# Patient Record
Sex: Female | Born: 1970 | Race: White | Hispanic: No | Smoking: Former smoker
Health system: Southern US, Community
[De-identification: ages and names within clinical notes are randomized; demographics above are authoritative.]

## PROBLEM LIST (undated history)

## (undated) DIAGNOSIS — E119 Type 2 diabetes mellitus without complications: Secondary | ICD-10-CM

## (undated) DIAGNOSIS — T4145XA Adverse effect of unspecified anesthetic, initial encounter: Secondary | ICD-10-CM

## (undated) DIAGNOSIS — Z8679 Personal history of other diseases of the circulatory system: Secondary | ICD-10-CM

## (undated) DIAGNOSIS — U071 COVID-19: Secondary | ICD-10-CM

## (undated) DIAGNOSIS — B009 Herpesviral infection, unspecified: Secondary | ICD-10-CM

## (undated) DIAGNOSIS — Z87442 Personal history of urinary calculi: Secondary | ICD-10-CM

## (undated) DIAGNOSIS — A630 Anogenital (venereal) warts: Secondary | ICD-10-CM

## (undated) DIAGNOSIS — E785 Hyperlipidemia, unspecified: Secondary | ICD-10-CM

## (undated) DIAGNOSIS — I10 Essential (primary) hypertension: Secondary | ICD-10-CM

## (undated) DIAGNOSIS — T8859XA Other complications of anesthesia, initial encounter: Secondary | ICD-10-CM

## (undated) DIAGNOSIS — K219 Gastro-esophageal reflux disease without esophagitis: Secondary | ICD-10-CM

## (undated) DIAGNOSIS — E079 Disorder of thyroid, unspecified: Secondary | ICD-10-CM

## (undated) HISTORY — DX: Hyperlipidemia, unspecified: E78.5

## (undated) HISTORY — DX: Essential (primary) hypertension: I10

---

## 1998-07-12 ENCOUNTER — Ambulatory Visit (HOSPITAL_BASED_OUTPATIENT_CLINIC_OR_DEPARTMENT_OTHER): Admission: RE | Admit: 1998-07-12 | Discharge: 1998-07-12 | Payer: Self-pay | Admitting: Surgery

## 1999-09-28 ENCOUNTER — Encounter (INDEPENDENT_AMBULATORY_CARE_PROVIDER_SITE_OTHER): Payer: Self-pay | Admitting: Specialist

## 1999-09-28 ENCOUNTER — Other Ambulatory Visit: Admission: RE | Admit: 1999-09-28 | Discharge: 1999-09-28 | Payer: Self-pay | Admitting: Obstetrics and Gynecology

## 1999-10-13 ENCOUNTER — Encounter (INDEPENDENT_AMBULATORY_CARE_PROVIDER_SITE_OTHER): Payer: Self-pay

## 1999-10-13 ENCOUNTER — Other Ambulatory Visit: Admission: RE | Admit: 1999-10-13 | Discharge: 1999-10-13 | Payer: Self-pay | Admitting: *Deleted

## 1999-12-13 ENCOUNTER — Encounter: Payer: Self-pay | Admitting: Family Medicine

## 1999-12-13 ENCOUNTER — Encounter: Admission: RE | Admit: 1999-12-13 | Discharge: 1999-12-13 | Payer: Self-pay | Admitting: Family Medicine

## 2000-02-13 ENCOUNTER — Other Ambulatory Visit: Admission: RE | Admit: 2000-02-13 | Discharge: 2000-02-13 | Payer: Self-pay | Admitting: *Deleted

## 2000-06-12 ENCOUNTER — Other Ambulatory Visit: Admission: RE | Admit: 2000-06-12 | Discharge: 2000-06-12 | Payer: Self-pay | Admitting: *Deleted

## 2000-11-07 ENCOUNTER — Other Ambulatory Visit: Admission: RE | Admit: 2000-11-07 | Discharge: 2000-11-07 | Payer: Self-pay | Admitting: *Deleted

## 2001-03-11 ENCOUNTER — Other Ambulatory Visit: Admission: RE | Admit: 2001-03-11 | Discharge: 2001-03-11 | Payer: Self-pay | Admitting: Obstetrics and Gynecology

## 2001-07-12 ENCOUNTER — Other Ambulatory Visit: Admission: RE | Admit: 2001-07-12 | Discharge: 2001-07-12 | Payer: Self-pay | Admitting: Obstetrics and Gynecology

## 2002-01-23 ENCOUNTER — Other Ambulatory Visit: Admission: RE | Admit: 2002-01-23 | Discharge: 2002-01-23 | Payer: Self-pay | Admitting: Obstetrics and Gynecology

## 2002-08-27 ENCOUNTER — Other Ambulatory Visit: Admission: RE | Admit: 2002-08-27 | Discharge: 2002-08-27 | Payer: Self-pay | Admitting: Obstetrics and Gynecology

## 2002-12-08 ENCOUNTER — Other Ambulatory Visit: Admission: RE | Admit: 2002-12-08 | Discharge: 2002-12-08 | Payer: Self-pay | Admitting: Obstetrics and Gynecology

## 2003-06-15 ENCOUNTER — Other Ambulatory Visit: Admission: RE | Admit: 2003-06-15 | Discharge: 2003-06-15 | Payer: Self-pay | Admitting: Obstetrics and Gynecology

## 2004-01-13 ENCOUNTER — Other Ambulatory Visit: Admission: RE | Admit: 2004-01-13 | Discharge: 2004-01-13 | Payer: Self-pay | Admitting: Family Medicine

## 2004-01-14 ENCOUNTER — Encounter: Admission: RE | Admit: 2004-01-14 | Discharge: 2004-01-14 | Payer: Self-pay | Admitting: Obstetrics and Gynecology

## 2004-02-28 ENCOUNTER — Emergency Department (HOSPITAL_COMMUNITY): Admission: EM | Admit: 2004-02-28 | Discharge: 2004-02-28 | Payer: Self-pay | Admitting: Emergency Medicine

## 2004-09-23 ENCOUNTER — Other Ambulatory Visit: Admission: RE | Admit: 2004-09-23 | Discharge: 2004-09-23 | Payer: Self-pay | Admitting: Obstetrics and Gynecology

## 2004-10-31 ENCOUNTER — Encounter: Admission: RE | Admit: 2004-10-31 | Discharge: 2004-11-07 | Payer: Self-pay | Admitting: Family Medicine

## 2005-06-07 ENCOUNTER — Other Ambulatory Visit: Admission: RE | Admit: 2005-06-07 | Discharge: 2005-06-07 | Payer: Self-pay | Admitting: Obstetrics and Gynecology

## 2005-08-09 ENCOUNTER — Encounter: Admission: RE | Admit: 2005-08-09 | Discharge: 2005-08-09 | Payer: Self-pay | Admitting: Obstetrics and Gynecology

## 2005-08-15 ENCOUNTER — Inpatient Hospital Stay (HOSPITAL_COMMUNITY): Admission: AD | Admit: 2005-08-15 | Discharge: 2005-08-15 | Payer: Self-pay | Admitting: Obstetrics and Gynecology

## 2005-09-06 ENCOUNTER — Inpatient Hospital Stay (HOSPITAL_COMMUNITY): Admission: RE | Admit: 2005-09-06 | Discharge: 2005-09-09 | Payer: Self-pay | Admitting: Obstetrics and Gynecology

## 2005-09-10 ENCOUNTER — Encounter: Admission: RE | Admit: 2005-09-10 | Discharge: 2005-10-10 | Payer: Self-pay | Admitting: Obstetrics and Gynecology

## 2005-10-11 ENCOUNTER — Encounter: Admission: RE | Admit: 2005-10-11 | Discharge: 2005-11-09 | Payer: Self-pay | Admitting: Obstetrics and Gynecology

## 2005-10-23 ENCOUNTER — Other Ambulatory Visit: Admission: RE | Admit: 2005-10-23 | Discharge: 2005-10-23 | Payer: Self-pay | Admitting: Obstetrics and Gynecology

## 2007-06-04 HISTORY — PX: CARDIOVASCULAR STRESS TEST: SHX262

## 2009-01-06 HISTORY — PX: TRANSTHORACIC ECHOCARDIOGRAM: SHX275

## 2010-05-30 ENCOUNTER — Encounter: Admission: RE | Admit: 2010-05-30 | Discharge: 2010-05-30 | Payer: Self-pay | Admitting: Otolaryngology

## 2011-04-21 NOTE — H&P (Signed)
Julia [name], PEPLINSKI                   ACCOUNT NO.:  [id number]   MEDICAL RECORD NO.:  [id number]          PATIENT TYPE:  INP   LOCATION:  NA                            FACILITY:  [facility]   PHYSICIAN:  [name], M.D.DATE OF BIRTH:  04/30/1971   DATE OF ADMISSION:  09/06/2005  DATE OF DISCHARGE:                                HISTORY & PHYSICAL   CHIEF COMPLAINT:  1.  Mature L/S ratio at 36 weeks.  2.  Fetal macrosomia with history of diabetes.   HISTORY:  A 40 year old, G1, P0, EDD of September 29, 2005, EGA is 36-1/2  weeks.  This patient's pregnancy has been complicated by diabetes that has  required initially Glyburide and then insulin for regulation of her blood  sugars.  More recently she had some gestational hypertension.  An  amniocentesis was carried out earlier this week showing an mature L/S 3.2:1  positive PG.  Because of the EFW being 8 pounds 7 ounces with an unfavorable  cervix and a floating vertex, her preference is for primary cesarean  section.  This procedure including risks of bleeding, infection,  transfusion, adjacent organ injury, along with her expected recovery all  reviewed with her which she understands and accepts.   Additionally, she has had twice weekly nonstress test that have been  reactive.   PAST MEDICAL HISTORY:  Please see her [place] form for details.  She does  have a history of:  1.  Anxiety.  2.  Depression.  3.  Uses an inhaler p.r.n. for asthmatic bronchitis.  4.  Has had been on oral meds for diabetes in the past.   Her blood is O positive.  Rubella titer is immune.   PHYSICAL EXAMINATION:  VITAL SIGNS:  Temp 98.2, blood pressure 132/88.  HEENT:  Unremarkable.  NECK:  Supple without masses.  LUNGS:  Clear.  CARDIOVASCULAR:  Regular rate and rhythm without murmurs, rubs, gallops.  BREASTS:  Without masses.  ABDOMEN:  Fundal height is 41-cm.  Fetal heart rate 140s.  PELVIC:  Cervix long closed floating vertex.  EXTREMITIES:   Unremarkable.  NEUROLOGIC:  Unremarkable.   IMPRESSION:  1.  A 36+ week intrauterine pregnancy.  2.  Pregnancy complicated by gestational hypertension and insulin-requiring      diabetes.  3.  Mature L/S ratio.  4.  Fetal macrosomia with an unfavorable cervix.   PLAN:  Primary cesarean section.  Procedure and risks reviewed as above.      Richard M. [name], M.D.  Electronically Signed     RMH/MEDQ  D:  09/05/2005  T:  09/05/2005  Job:  [id number]

## 2011-04-21 NOTE — Discharge Summary (Signed)
Julia [name], Julia [name]                   ACCOUNT NO.:  [id number]   MEDICAL RECORD NO.:  [id number]          PATIENT TYPE:  INP   LOCATION:  9145                          FACILITY:  [facility]   PHYSICIAN:  Dineen Kid. Rana Snare, M.D.    DATE OF BIRTH:  [birth year]   DATE OF ADMISSION:  09/06/2005  DATE OF DISCHARGE:  09/09/2005                                 DISCHARGE SUMMARY   ADMITTING DIAGNOSES:  1.  Intrauterine pregnancy at 36-and-a-half weeks estimated gestational age.  2.  Gestational diabetes, insulin-dependent.  3.  Status post amniocentesis with mature L/S ratio.  4.  Fetal macrosomia.   DISCHARGE DIAGNOSES:  1.  Status post low transverse cesarean section.  2.  Viable female infant.   PROCEDURE:  Primary low transverse cesarean section.   REASON FOR ADMISSION:  Please see dictated H&P.   HOSPITAL COURSE:  The patient is a 40 year old primigravida that was  admitted to [facility] at 36-and-a-half weeks for a  scheduled cesarean delivery. The patient's pregnancy had been complicated by  diabetes which initially had been managed by glyburide; however, during the  pregnancy insulin was needed for regulation of her blood sugar.  Amniocentesis had been carried out early in the week prior to admission with  mature L/S ratio of 3.2:1 with positive PG. Because of the estimated fetal  weight being 8 pounds 7 ounces with unfavorable cervix with a floating  vertex, decision was made to proceed with a primary cesarean delivery. On  the morning of admission the patient had been taken to the operating room  where spinal anesthesia was administered without difficulty. A low  transverse incision was made with the delivery of a viable female infant  weighing 10 pounds 4 ounces, Apgars of 8 at one minute and 9 at five  minutes. Arterial cord pH was 7.23. The patient tolerated the procedure well  and was taken to the recovery room in stable condition. On postoperative day  #1 the patient was  tolerating a regular diet. She had good relief of pain  from oral medication. Vital signs were stable. Blood pressure 110/70. She  was afebrile. Capillary blood sugars revealed sugars ranging 153-174.  Hemoglobin was 11.0; platelet count of 243,000; wbc count of 9.7. Abdomen  was soft with good return of bowel function. Fundus was firm and nontender.  The patient was restarted on glyburide 10 mg twice a day. On postoperative  day #2 vital signs were stable. Capillary blood sugars were noted to be 118-  174. However, later in the morning, capillary blood sugar had dropped to 71.  Decision was made to discontinue the glyburide. On postoperative day #3 the  patient was doing well. She was without complaint. Vital signs were stable.  Capillary blood sugars were within normal limits off the glyburide. Incision  was clean, dry and intact. Staples were removed and the patient was later  discharged home.   CONDITION ON DISCHARGE:  Good.   DIET:  Regular as tolerated.   ACTIVITY:  No heavy lifting, no driving x2 weeks, no vaginal entry.  FOLLOW-UP:  The patient is to follow up in the office in 1-2 weeks for an  incision check. She is to call for temperature greater than 100 degrees,  persistent nausea and vomiting, heavy vaginal bleeding, and/or redness or  drainage from the incisional site.   DISCHARGE MEDICATIONS:  1.  Tylox #30 one p.o. q.4-6h. p.r.n.  2.  Motrin 600 mg q.6h.  3.  Albuterol inhaler as needed.  4.  Colace one p.o. daily p.r.n.      Julio Sicks, N.P.      Dineen Kid Rana Snare, M.D.  Electronically Signed    CC/MEDQ  D:  10/13/2005  T:  10/13/2005  Job:  [id number]

## 2011-04-21 NOTE — Op Note (Signed)
NAMEANVITA, HIRATA                   ACCOUNT NO.:  [id number]   MEDICAL RECORD NO.:  [id number]          PATIENT TYPE:  INP   LOCATION:  9199                          FACILITY:  [facility]   PHYSICIAN:  [name], M.D.DATE OF BIRTH:  1970-12-19   DATE OF PROCEDURE:  09/06/2005  DATE OF DISCHARGE:                                 OPERATIVE REPORT   PREOPERATIVE DIAGNOSES:  1.  A 36-1/2 week intrauterine pregnancy.  2.  Insulin-requiring diabetes.  3.  _Mature L/S ratio.  4.  Fetal macrosomia, unfavorable cervix.   POSTOPERATIVE DIAGNOSES:  1.  A 36-1/2 week intrauterine pregnancy.  2.  Insulin-requiring diabetes.  3.  Mature L/S ratio  4.  Fetal macrosomia, unfavorable cervix.   PROCEDURE:  Primary low transverse cesarean section.   SURGEON:  [name], M.D.   ANESTHESIA:  Spinal.   COMPLICATIONS:  None.   DRAINS:  Foley catheter.   ESTIMATED BLOOD LOSS:  800.   PROCEDURE AND FINDINGS:  The patient was taken to the operating room and  after an adequate level of spinal anesthetic was obtained, with the patient  in left tilt position, the abdomen was prepped and draped in usual manner  for sterile abdominal procedure.  Foley catheter positioned draining clear  urine transversely.  A Pfannenstiel incision was made two fingerbreadths  above the symphysis, carried down to the fascia, which was incised and  extended transversely. Rectus muscle divided in the midline. Peritoneum  entered superiorly without incident and extended in a vertical fashion. The  vesicouterine serosa was then incised and the bladder was bluntly sharply  dissected off the lower uterine segment. Bladder blade was positioned.  Transverse incision made in the low segment, extended with bandage scissors.  Clear fluid noted. The patient delivered of a healthy female, Apgars 8 and 9,  cord pH 7.23. The infant was suctioned, cord clamped, and passed to  pediatric team for further care. Placenta was  delivered manually intact.  Uterus exteriorized, cavity wiped clean with laparotomy pack. Closure  obtained with first layer of 0 chromic in locked fashion followed by an  imbricating layer of 0 chromic. This was hemostatic. Tubes and ovaries were  normal. Bladder flap area was intact and hemostatic. Prior to closure,  sponge, needle, and instrument counts were reported as correct x2.  Peritoneum closed with a 2-0 Vicryl suture. Rectus muscles reapproximated in  the midline  with interrupted 2-0 Vicryl sutures. Fascia closed from laterally to midline  on either side with 0 PDS suture. Subcutaneous fat was hemostatic.  Clips  and Steri-Strips used on the skin. She tolerated this well, went to recovery  room in good condition. She did receive Pitocin IV and Ancef 1 g IV after  cord was clamped.      Richard M. [name], M.D.  Electronically Signed     RMH/MEDQ  D:  09/06/2005  T:  09/06/2005  Job:  [id number]

## 2012-07-30 ENCOUNTER — Ambulatory Visit (INDEPENDENT_AMBULATORY_CARE_PROVIDER_SITE_OTHER): Payer: 59 | Admitting: Internal Medicine

## 2012-07-30 VITALS — BP 140/95 | HR 82 | Temp 98.8°F | Resp 18 | Ht 64.0 in | Wt 175.0 lb

## 2012-07-30 DIAGNOSIS — I10 Essential (primary) hypertension: Secondary | ICD-10-CM

## 2012-07-30 DIAGNOSIS — E119 Type 2 diabetes mellitus without complications: Secondary | ICD-10-CM

## 2012-07-30 DIAGNOSIS — R110 Nausea: Secondary | ICD-10-CM

## 2012-07-30 DIAGNOSIS — E039 Hypothyroidism, unspecified: Secondary | ICD-10-CM

## 2012-07-30 DIAGNOSIS — N760 Acute vaginitis: Secondary | ICD-10-CM

## 2012-07-30 DIAGNOSIS — L0390 Cellulitis, unspecified: Secondary | ICD-10-CM

## 2012-07-30 DIAGNOSIS — E7800 Pure hypercholesterolemia, unspecified: Secondary | ICD-10-CM

## 2012-07-30 DIAGNOSIS — L0291 Cutaneous abscess, unspecified: Secondary | ICD-10-CM

## 2012-07-30 LAB — POCT CBC
Granulocyte percent: 79.3 %G (ref 37–80)
HCT, POC: 47.7 % (ref 37.7–47.9)
Hemoglobin: 15.1 g/dL (ref 12.2–16.2)
MCH, POC: 27.1 pg (ref 27–31.2)
MCV: 85.7 fL (ref 80–97)
RBC: 5.57 M/uL — AB (ref 4.04–5.48)
WBC: 7.2 10*3/uL (ref 4.6–10.2)

## 2012-07-30 LAB — POCT UA - MICROSCOPIC ONLY: Casts, Ur, LPF, POC: NEGATIVE

## 2012-07-30 LAB — POCT URINALYSIS DIPSTICK
Bilirubin, UA: NEGATIVE
Glucose, UA: 500
Ketones, UA: 80
Leukocytes, UA: NEGATIVE
Nitrite, UA: NEGATIVE
pH, UA: 6

## 2012-07-30 LAB — POCT GLYCOSYLATED HEMOGLOBIN (HGB A1C): Hemoglobin A1C: 11.1

## 2012-07-30 MED ORDER — SULFAMETHOXAZOLE-TRIMETHOPRIM 800-160 MG PO TABS: 1.0000 | ORAL_TABLET | Freq: Two times a day (BID) | ORAL | Status: DC

## 2012-07-30 MED ORDER — METFORMIN HCL 1000 MG PO TABS: 1000.0000 mg | ORAL_TABLET | Freq: Two times a day (BID) | ORAL | Status: DC

## 2012-07-30 MED ORDER — LISINOPRIL 10 MG PO TABS: 10.0000 mg | ORAL_TABLET | Freq: Every day | ORAL | Status: DC

## 2012-07-30 MED ORDER — PIOGLITAZONE HCL 15 MG PO TABS: 15.0000 mg | ORAL_TABLET | Freq: Every day | ORAL | Status: DC

## 2012-07-30 NOTE — Progress Notes (Signed)
  Subjective:    Patient ID: Julia [name], female    DOB: [birth year], 41 y.o.   MRN: [id number]  HPI    Review of Systems     Objective:   Physical Exam  Betadine prep to Left labia majora-2%plain lidocaine-2cc locally at site of maximum fluctuance.  #11 blade made <=1cm incision.  Culture taken.  Copious amounts of purulent drainage expressed.  Packed with plain 1/4" gauze ~9cm. Patient given maxi-pad.     Assessment & Plan:  RTC in 48hrs for repacking. Dr. Mindi Junker gave a handwritten prescription for vicodin 5/325 #15 for pain. No RF.

## 2012-07-30 NOTE — Progress Notes (Signed)
Subjective:    Patient ID: Julia [name], female    DOB: 07-06-1971, 41 y.o.   MRN: [id number]  HPI Boil left groin for 1 week  Lots of pain 8/10 Minimal drainage Has not been compliant with her medications for the past year. Stopped htn, dm,thyroid meds on her own because  Of financial issues and also was exercising and thought she did not need the meds.   Review of Systems  Constitutional: Positive for activity change and fatigue.  HENT: Negative.   Eyes: Negative.   Respiratory: Negative.   Cardiovascular: Negative.   Gastrointestinal: Negative.   Genitourinary: Positive for genital sores.       Swelling redness pain of labia on the left  Neurological: Negative.   Hematological: Negative.   Psychiatric/Behavioral: Negative.   All other systems reviewed and are negative.       Objective:   Physical Exam  Nursing note and vitals reviewed. Constitutional: She is oriented to person, place, and time. She appears well-developed and well-nourished.  HENT:  Head: Normocephalic and atraumatic.  Right Ear: External ear normal.  Left Ear: External ear normal.  Eyes: Conjunctivae and EOM are normal. Pupils are equal, round, and reactive to light.  Neck: Normal range of motion. Neck supple. No thyromegaly present.  Cardiovascular: Normal rate, regular rhythm, normal heart sounds and intact distal pulses.   Pulmonary/Chest: Effort normal and breath sounds normal.  Abdominal: Soft. Bowel sounds are normal.  Genitourinary:       Labial left redness swelling fluctuance 4 by 4 cm  Musculoskeletal: Normal range of motion.  Neurological: She is alert and oriented to person, place, and time. She has normal reflexes.  Skin: There is erythema.       abcess left labia  Psychiatric: She has a normal mood and affect. Her behavior is normal. Judgment and thought content normal.     Results for orders placed in visit on 07/30/12  POCT CBC      Component Value Range   WBC 7.2  4.6 - 10.2  K/uL   Lymph, poc 1.0  0.6 - 3.4   POC LYMPH PERCENT 13.4  10 - 50 %L   MID (cbc) 0.5  0 - 0.9   POC MID % 7.3  0 - 12 %M   POC Granulocyte 5.7  2 - 6.9   Granulocyte percent 79.3  37 - 80 %G   RBC 5.57 (*) 4.04 - 5.48 M/uL   Hemoglobin 15.1  12.2 - 16.2 g/dL   HCT, POC 40.9  81.1 - 47.9 %   MCV 85.7  80 - 97 fL   MCH, POC 27.1  27 - 31.2 pg   MCHC 31.7 (*) 31.8 - 35.4 g/dL   RDW, POC 91.4     Platelet Count, POC 301  142 - 424 K/uL   MPV 8.1  0 - 99.8 fL  GLUCOSE, POCT (MANUAL RESULT ENTRY)      Component Value Range   POC Glucose 241 (*) 70 - 99 mg/dl  POCT GLYCOSYLATED HEMOGLOBIN (HGB A1C)      Component Value Range   Hemoglobin A1C 11.1    POCT URINALYSIS DIPSTICK      Component Value Range   Color, UA yellow     Clarity, UA clear     Glucose, UA 500     Bilirubin, UA neg     Ketones, UA 80     Spec Grav, UA 1.025     Blood, UA trace  pH, UA 6.0     Protein, UA neg     Urobilinogen, UA 0.2     Nitrite, UA neg     Leukocytes, UA Negative    POCT UA - MICROSCOPIC ONLY      Component Value Range   WBC, Ur, HPF, POC 0-2     RBC, urine, microscopic neg     Bacteria, U Microscopic trace     Mucus, UA neg     Epithelial cells, urine per micros 0-3     Crystals, Ur, HPF, POC neg     Casts, Ur, LPF, POC neg     Yeast, UA meg         Assessment & Plan:  Diabetes hgb a1c is 11 nad her glucose is 241 Needs aggressive treatment of her diabetes. Will restart metformin and actos and encourage diet and glucose home testing. Hypertension Will restart her lisinopril which should help with her elevated bp and also offer protection of her kidneys given her diabetes. Hypothyroid Will evaluate her thyroid function and will restart her thyroid medications.  abcess  drained and packed. Will start bactrim and reassess in the office in 2 days.

## 2012-07-30 NOTE — Patient Instructions (Signed)
Take meds as directed. Return oin 2 days for packing advancement and re eval.

## 2012-08-01 ENCOUNTER — Ambulatory Visit (INDEPENDENT_AMBULATORY_CARE_PROVIDER_SITE_OTHER): Payer: 59 | Admitting: Physician Assistant

## 2012-08-01 VITALS — BP 109/72 | HR 90 | Temp 97.8°F | Resp 18 | Wt 174.0 lb

## 2012-08-01 DIAGNOSIS — R609 Edema, unspecified: Secondary | ICD-10-CM

## 2012-08-01 DIAGNOSIS — E119 Type 2 diabetes mellitus without complications: Secondary | ICD-10-CM | POA: Insufficient documentation

## 2012-08-01 DIAGNOSIS — I10 Essential (primary) hypertension: Secondary | ICD-10-CM

## 2012-08-01 MED ORDER — HYDROCHLOROTHIAZIDE 12.5 MG PO CAPS: 12.5000 mg | ORAL_CAPSULE | Freq: Every day | ORAL | Status: DC

## 2012-08-01 NOTE — Progress Notes (Signed)
  Subjective:    Patient ID: Julia [name], female    DOB: [birth year], 41 y.o.   MRN: [id number]  HPI 41 yr old female presents 2 days s/p I&D of L labia. She is having less pain.  No fever. She was restarted on her DM and htn meds (she hadn't been taking them in over 1 year). She hadn't taken synthroid in over 2 years, but her level came back normal.  She is extremely nauseated and her stomach feels awful since starting the 2000mg  metformin.  She also feels like she is retaining water really bad. She doesn't want to take Actos if she doesn't have to bc of all of the potential adverse reactions.  She will be getting a glucometer tomorrow. She is also going to change her diet and start exercising.  Review of Systems  All other systems reviewed and are negative.      Objective:   Physical Exam  Nursing note and vitals reviewed. Constitutional: She appears well-developed and well-nourished.  HENT:  Head: Normocephalic and atraumatic.  Cardiovascular: Normal rate.   Pulmonary/Chest: Effort normal.  Skin:       Packing removed L labia.  Still erythematous and indurated, but definitely better than 2 days ago!!  Irrigated with 5 cc plain 2% lidocaine. Repacked with 1/4" plain guaze ~7cm      Assessment & Plan:  Nausea/stomach upset-stop high dose metformin and we will restart and slowly titrate to full dose over 3 weeks.  She will take blood sugars and fax them to me in 3-4 weeks.  As long as they are coming down and approaching normal/normal and she is implementing [facility] plan/similar plan of eating and increasing exercise, we will try to avoid actos.  Hypertension-controlled on lisinopril.  With the swelling she is having she can take low dose HCTZ for a few days. Abscess-improving.  Continue septra, repack in 48 hrs.

## 2012-08-02 LAB — WOUND CULTURE

## 2012-08-03 ENCOUNTER — Ambulatory Visit (INDEPENDENT_AMBULATORY_CARE_PROVIDER_SITE_OTHER): Payer: 59 | Admitting: Physician Assistant

## 2012-08-03 VITALS — BP 100/74 | HR 81 | Temp 98.5°F | Resp 18 | Wt 175.0 lb

## 2012-08-03 DIAGNOSIS — N762 Acute vulvitis: Secondary | ICD-10-CM

## 2012-08-03 DIAGNOSIS — N760 Acute vaginitis: Secondary | ICD-10-CM

## 2012-08-03 DIAGNOSIS — R110 Nausea: Secondary | ICD-10-CM

## 2012-08-03 MED ORDER — DOXYCYCLINE HYCLATE 100 MG PO TABS: 100.0000 mg | ORAL_TABLET | Freq: Two times a day (BID) | ORAL | Status: AC

## 2012-08-03 NOTE — Patient Instructions (Signed)
Start new abx, take with food, hopefully that will help with her nausea. Continue warm compresses. Recheck 2-3d.

## 2012-08-03 NOTE — Progress Notes (Signed)
  Subjective:    Patient ID: Julia [name], female    DOB: 04/26/1971, 41 y.o.   MRN: [id number]  HPI  Pt presents to clinic for wound recheck after I&D 07/30/2012 of abscess L labia/inguinal area.  She has much less pain.  Less drainage over the last couple of days.  She is having a hard time tolerating the bactrim, it is making her very nauseated for about 3h after she takes it, she is taking with food.  Review of Systems  Constitutional: Negative for fever and chills.  Gastrointestinal: Positive for nausea.  Skin: Positive for wound.       Objective:   Physical Exam  Vitals reviewed. Constitutional: She is oriented to person, place, and time. She appears well-developed and well-nourished.  HENT:  Head: Normocephalic and atraumatic.  Right Ear: External ear normal.  Left Ear: External ear normal.  Pulmonary/Chest: Effort normal.  Neurological: She is alert and oriented to person, place, and time.  Skin: Skin is warm and dry.       Drsg and packing removed.  Some erythema, tenderness and induration around the wound, minimal purulence expressed. Irrigated wound with 2% lido and repacked with 1/4in plain packing.  Pt tolerated well.  Drsg replaced.  Psychiatric: She has a normal mood and affect. Her behavior is normal. Judgment and thought content normal.          Assessment & Plan:   1. Cellulitis of labia  doxycycline (VIBRA-TABS) 100 MG tablet  2. Nausea     Pt to stop Bactrim due to SE.

## 2012-08-04 ENCOUNTER — Telehealth: Payer: Self-pay

## 2012-08-04 NOTE — Telephone Encounter (Signed)
Patient notified

## 2012-08-04 NOTE — Telephone Encounter (Signed)
I think that it is ok.  Pt should continue warm compresses to keep the incision open and recheck when she gets back in town.

## 2012-08-04 NOTE — Telephone Encounter (Signed)
rec'd packing for a wound yesterday. Today the packing came out when she pullled off the tape. She is out of town. Please call to advis

## 2012-08-06 ENCOUNTER — Ambulatory Visit (INDEPENDENT_AMBULATORY_CARE_PROVIDER_SITE_OTHER): Payer: 59 | Admitting: Physician Assistant

## 2012-08-06 VITALS — BP 124/88 | HR 81 | Temp 98.3°F | Resp 14 | Ht 64.0 in | Wt 176.0 lb

## 2012-08-06 DIAGNOSIS — L0291 Cutaneous abscess, unspecified: Secondary | ICD-10-CM

## 2012-08-06 DIAGNOSIS — L03314 Cellulitis of groin: Secondary | ICD-10-CM

## 2012-08-06 DIAGNOSIS — B9562 Methicillin resistant Staphylococcus aureus infection as the cause of diseases classified elsewhere: Secondary | ICD-10-CM

## 2012-08-06 DIAGNOSIS — L02219 Cutaneous abscess of trunk, unspecified: Secondary | ICD-10-CM

## 2012-08-06 NOTE — Progress Notes (Signed)
  Subjective:    Patient ID: Julia [name], female    DOB: 02-23-1971, 41 y.o.   MRN: [id number]  HPI  Pt presents to clinic for wound recheck.  Tolerating Doxy a little better but still nauseated.  Packing fell out 2 days ago. Expressing clear fluid from area.  She has been manipulating wound more since packing fell out to prevent it from closing to early.  Review of Systems  Constitutional: Negative for fever and chills.  Gastrointestinal: Positive for nausea.  Skin: Positive for wound.       Objective:   Physical Exam  Vitals reviewed. Constitutional: She is oriented to person, place, and time. She appears well-developed and well-nourished.  HENT:  Head: Normocephalic and atraumatic.  Right Ear: External ear normal.  Left Ear: External ear normal.  Pulmonary/Chest: Effort normal.  Neurological: She is alert and oriented to person, place, and time.  Skin: Skin is warm and dry.       Wound irrigated with 2% lido.  Incision 1/2cm long and difficult to pack but still about 1cm deep so packing with umbilical tape.  Pt tolerated ok.  Psychiatric: She has a normal mood and affect. Her behavior is normal. Judgment and thought content normal.     Labs reviewed.  MRSA in cx, sensitivities reviewed.      Assessment & Plan:   1. MRSA cellulitis   2. Cellulitis of groin, left    Continue warm compresses.  Continue Abx. Recheck in 3 days.

## 2012-08-09 ENCOUNTER — Ambulatory Visit (INDEPENDENT_AMBULATORY_CARE_PROVIDER_SITE_OTHER): Payer: 59 | Admitting: Physician Assistant

## 2012-08-09 VITALS — BP 110/70 | HR 66 | Temp 98.6°F | Resp 16

## 2012-08-09 DIAGNOSIS — L02219 Cutaneous abscess of trunk, unspecified: Secondary | ICD-10-CM

## 2012-08-09 DIAGNOSIS — L03314 Cellulitis of groin: Secondary | ICD-10-CM

## 2012-08-09 DIAGNOSIS — E119 Type 2 diabetes mellitus without complications: Secondary | ICD-10-CM

## 2012-08-09 DIAGNOSIS — L03319 Cellulitis of trunk, unspecified: Secondary | ICD-10-CM

## 2012-08-09 MED ORDER — METFORMIN HCL ER 500 MG PO TB24: 1000.0000 mg | ORAL_TABLET | Freq: Every day | ORAL | Status: DC

## 2012-08-09 NOTE — Progress Notes (Signed)
  Subjective:    Patient ID: Julia [name], female    DOB: 02/12/1971, 41 y.o.   MRN: [id number]  HPI  Pt presents to clinic for recheck wound on l groin.  She feels like the pain is much better.  She has figured out that it is the Metformin that is causing her nausea so she stopped it and she is tolerating the abx without problems.  Review of Systems  Constitutional: Negative for fever and chills.  Gastrointestinal: Negative for nausea.  Skin: Positive for wound.       Objective:   Physical Exam  Vitals reviewed. Constitutional: She is oriented to person, place, and time. She appears well-developed and well-nourished.  HENT:  Head: Normocephalic and atraumatic.  Right Ear: External ear normal.  Left Ear: External ear normal.  Pulmonary/Chest: Effort normal.  Neurological: She is alert and oriented to person, place, and time.  Skin: Skin is warm and dry.       Packing removed.  Irrigated with 2% lido.  Repacked with umbilical tape.  No erythema, improved induration.  Psychiatric: She has a normal mood and affect. Her behavior is normal. Judgment and thought content normal.       Assessment & Plan:   1. Cellulitis of groin, left    2. DM (diabetes mellitus)  metFORMIN (GLUCOPHAGE XR) 500 MG 24 hr tablet   Will change to extended released metformin to see if decreased nausea.  If packing has not fallen out by Tuesday recheck.

## 2012-08-13 ENCOUNTER — Ambulatory Visit (INDEPENDENT_AMBULATORY_CARE_PROVIDER_SITE_OTHER): Payer: 59 | Admitting: Physician Assistant

## 2012-08-13 VITALS — BP 132/84 | HR 78 | Temp 98.1°F | Resp 18 | Ht 64.0 in | Wt 174.0 lb

## 2012-08-13 DIAGNOSIS — B373 Candidiasis of vulva and vagina: Secondary | ICD-10-CM

## 2012-08-13 DIAGNOSIS — L03314 Cellulitis of groin: Secondary | ICD-10-CM

## 2012-08-13 DIAGNOSIS — J45901 Unspecified asthma with (acute) exacerbation: Secondary | ICD-10-CM

## 2012-08-13 DIAGNOSIS — J069 Acute upper respiratory infection, unspecified: Secondary | ICD-10-CM

## 2012-08-13 DIAGNOSIS — B3731 Acute candidiasis of vulva and vagina: Secondary | ICD-10-CM

## 2012-08-13 DIAGNOSIS — L02219 Cutaneous abscess of trunk, unspecified: Secondary | ICD-10-CM

## 2012-08-13 DIAGNOSIS — E119 Type 2 diabetes mellitus without complications: Secondary | ICD-10-CM

## 2012-08-13 DIAGNOSIS — R059 Cough, unspecified: Secondary | ICD-10-CM

## 2012-08-13 DIAGNOSIS — R05 Cough: Secondary | ICD-10-CM

## 2012-08-13 MED ORDER — ACCU-CHEK COMPACT PLUS CARE KIT: 1.0000 | PACK | Freq: Two times a day (BID) | Status: DC

## 2012-08-13 MED ORDER — BENZONATATE 100 MG PO CAPS: ORAL_CAPSULE | ORAL | Status: AC

## 2012-08-13 MED ORDER — IPRATROPIUM BROMIDE 0.06 % NA SOLN: 2.0000 | Freq: Three times a day (TID) | NASAL | Status: DC

## 2012-08-13 MED ORDER — ALBUTEROL SULFATE HFA 108 (90 BASE) MCG/ACT IN AERS: 2.0000 | INHALATION_SPRAY | Freq: Four times a day (QID) | RESPIRATORY_TRACT | Status: DC | PRN

## 2012-08-13 MED ORDER — FLUCONAZOLE 150 MG PO TABS: 150.0000 mg | ORAL_TABLET | Freq: Once | ORAL | Status: AC

## 2012-08-13 MED ORDER — ACCU-CHEK SOFT TOUCH LANCETS MISC: Status: AC

## 2012-08-13 MED ORDER — GLUCOSE BLOOD VI STRP: ORAL_STRIP | Status: AC

## 2012-08-13 NOTE — Progress Notes (Signed)
  Subjective:    Patient ID: Julia [name], female    DOB: [birth year], 41 y.o.   MRN: [id number]  HPI Pt presents for multiple problems 1- here for groin cellulitis recheck.  Thinks the packing is still in there.  Has no pain. 2- thinks she has a yeast infection from the abx for the above problem - vaginal itching 3- needs DM test materials sent to pharmacy 4- cold symptoms for 3 days - getting worse with congestion and cough.  Cough is dry like a tickle in her throat, worse at night.  She has h/o asthma and at begin of the cold she did have some SOB but that is improving - but she does not have an inhaler to use at home.  She has some pain in her R should blade area since she has started coughing and she would like someone to listen to her lungs.  No h/o of allergies in the past.  She is not a current smoker.  Review of Systems  Constitutional: Negative for fever and chills.  HENT: Positive for congestion, rhinorrhea and postnasal drip. Negative for sore throat.   Gastrointestinal: Negative for nausea, vomiting and diarrhea.  Genitourinary: Vaginal pain: itching only.  Musculoskeletal: Positive for myalgias (specific area on R shoulder blade area).  Skin: Positive for wound.  Neurological: Negative for headaches.       Objective:   Physical Exam  Vitals reviewed. Constitutional: She is oriented to person, place, and time. She appears well-developed and well-nourished.  HENT:  Head: Normocephalic and atraumatic.  Right Ear: Hearing, tympanic membrane, external ear and ear canal normal.  Left Ear: Hearing, tympanic membrane, external ear and ear canal normal.  Nose: Mucosal edema present.  Mouth/Throat: Uvula is midline and oropharynx is clear and moist.  Eyes: Conjunctivae are normal.  Neck: Neck supple.  Cardiovascular: Normal rate, regular rhythm and normal heart sounds.   Pulmonary/Chest: Effort normal and breath sounds normal.  Musculoskeletal:       Arms: Lymphadenopathy:   She has no cervical adenopathy.  Neurological: She is alert and oriented to person, place, and time.  Skin: Skin is warm and dry.       Groin wound - packing has fallen out - no erythema or induration - ok to not repack today   Psychiatric: She has a normal mood and affect. Her behavior is normal. Judgment and thought content normal.          Assessment & Plan:   1. URI (upper respiratory infection)  benzonatate (TESSALON) 100 MG capsule, ipratropium (ATROVENT) 0.06 % nasal spray  2. DM (diabetes mellitus)  glucose blood (ACCU-CHEK COMPACT TEST DRUM) test strip, Lancets (ACCU-CHEK SOFT TOUCH) lancets, Blood Glucose Monitoring Suppl (ACCU-CHEK COMPACT CARE KIT) KIT  3. Yeast vaginitis  fluconazole (DIFLUCAN) 150 MG tablet  4. Asthma exacerbation, mild  albuterol (PROVENTIL HFA;VENTOLIN HFA) 108 (90 BASE) MCG/ACT inhaler  5. Cough  benzonatate (TESSALON) 100 MG capsule  6. Cellulitis of groin, left     1- pt to push fluids - tylenol/motrin prn  6- no need for f/u for cellulitis - good healing at this point

## 2013-01-02 ENCOUNTER — Ambulatory Visit (INDEPENDENT_AMBULATORY_CARE_PROVIDER_SITE_OTHER): Payer: 59 | Admitting: Physician Assistant

## 2013-01-02 VITALS — BP 148/89 | HR 99 | Temp 98.1°F | Resp 16 | Ht 63.5 in | Wt 172.2 lb

## 2013-01-02 DIAGNOSIS — R4589 Other symptoms and signs involving emotional state: Secondary | ICD-10-CM

## 2013-01-02 DIAGNOSIS — M545 Low back pain, unspecified: Secondary | ICD-10-CM

## 2013-01-02 DIAGNOSIS — F430 Acute stress reaction: Secondary | ICD-10-CM

## 2013-01-02 DIAGNOSIS — G4700 Insomnia, unspecified: Secondary | ICD-10-CM

## 2013-01-02 DIAGNOSIS — F341 Dysthymic disorder: Secondary | ICD-10-CM

## 2013-01-02 MED ORDER — MELATONIN 5 MG PO CAPS: 5.0000 mg | ORAL_CAPSULE | Freq: Every day | ORAL | Status: DC

## 2013-01-02 MED ORDER — CITALOPRAM HYDROBROMIDE 20 MG PO TABS: 20.0000 mg | ORAL_TABLET | Freq: Every day | ORAL | Status: DC

## 2013-01-02 NOTE — Patient Instructions (Addendum)
Begin taking the Celexa (citalopram) once daily at bedtime.  Also take the melatonin 5mg  once daily about 30 minutes before bedtime.  Continue meeting with your counselor as I think this will be very beneficial for you.  Please be in touch in the next 2-4 weeks and let me know how you are doing on the medication (sooner if worse).  Let's plan to see you back in the office in 6-8 weeks to check in, at this time it might be a good idea to do a complete physical with labs and follow-up on your hypertension and diabetes.  Please let us know if anything comes up in the meantime.

## 2013-01-02 NOTE — Progress Notes (Signed)
Subjective:    Patient ID: Julia [name], female    DOB: 11-06-1971, 42 y.o.   MRN: [id number]  HPI   Julia [name] is a very pleasant 42 yr old female here with a chief complaint of feeling "mentally trashed."  States that her 2.5 yr long relationship ended about two weeks ago.  Having a very hard time dealing with this.  States that the relationship was difficult for a long time, and that she knew it wasn't a good relationship.  But now misses him and is struggling without him.  They lived together but he has since moved out and back in with his ex-wife, which is very hurtful to pt.  She is having crying spells on a daily basis.  Feels very sad.  Does not want to get out of bed in the morning.  Difficulty sleeping, feels like she is tossing and turning.  Has not had a good night's sleep in several weeks.  Feels distracted at work, really worried about her her job and not being able to perform well at work.  Also worried about being a good parent.  She has a 68 yr old son and feels like she needs to be strong to support him.  Pt states she has many acquaintances but few friends.  Felt like over the last 2 yrs her life became very tied to boyfriends - his friends, his hobbies, etc.  They were always doing things, now feels like she has nothing to do.  But also doesn't feel like doing things she enjoys.  Denies SI or HI.  No thoughts of harming herself.  Just does not feel like her normal self, and wants to get back to who she was.    Pt is seeing a counselor Julia Dandy Stephens [name]).  Had one appointment already this week, and two appointments scheduled for next week.  Counselor suggested that she come here to discuss.      Review of Systems  Psychiatric/Behavioral: Positive for sleep disturbance, dysphoric mood and decreased concentration. Negative for suicidal ideas and self-injury. The patient is nervous/anxious.   All other systems reviewed and are negative.       Objective:   Physical Exam  Vitals  reviewed. Constitutional: She is oriented to person, place, and time. She appears well-developed and well-nourished. No distress.  HENT:  Head: Normocephalic and atraumatic.  Eyes: Conjunctivae normal are normal. No scleral icterus.  Pulmonary/Chest: Effort normal.  Neurological: She is alert and oriented to person, place, and time.  Skin: Skin is warm and dry.  Psychiatric: Her speech is normal and behavior is normal. Judgment normal. Thought content is not paranoid and not delusional. Cognition and memory are normal. She exhibits a depressed mood. She expresses no homicidal and no suicidal ideation. She expresses no suicidal plans and no homicidal plans.       tearful     Filed Vitals:   01/02/13 1154  BP: 148/89  Pulse: 99  Temp: 98.1 F (36.7 C)  Resp: 16        Assessment & Plan:   1. Acute stress reaction  citalopram (CELEXA) 20 MG tablet  2. Feels depressed    3. Insomnia  Melatonin 5 MG CAPS    Julia [name] is a very pleasant 42 yr old female here experiencing acute stress and depressed mood after her long term relationship ended several weeks ago.  Pt is already attending counseling, which I think will be very beneficial for her.  Encouraged  her to continue this.  Congratulated her on identifying that she needed help seeking it.  Will add Celexa 20mg  daily as well as melatonin 5mg  QHS to help with sleep.  Encouraged pt to let me know how this is going in the next 2-4 weeks.  Also encouraged her to return for follow-up in 6-8 weeks to check in.  Additionally that would be a good time do a CPE and labs.  Pt has a history of poorly controlled DM and HTN that she admits to not managing very well lately.  Discussed RTC precautions, specifically SI, HI, worsening symptoms.  Pt understands and is in agreement with this plan.    Spent >30 minutes counseling patient.

## 2013-01-07 DIAGNOSIS — Z0271 Encounter for disability determination: Secondary | ICD-10-CM

## 2013-01-09 ENCOUNTER — Other Ambulatory Visit: Payer: Self-pay | Admitting: Internal Medicine

## 2013-01-09 ENCOUNTER — Other Ambulatory Visit: Payer: Self-pay | Admitting: Physician Assistant

## 2013-01-11 ENCOUNTER — Telehealth: Payer: Self-pay

## 2013-01-11 NOTE — Telephone Encounter (Signed)
PT WAS GIVEN A NOTE TO BE OUT OF WORK FOR A WEEK DUE TO MENTAL PROBLEMS.  SHE SAYS SHE IS STILL TRYING TO ADJUST TO THE MEDICATION AND IS SEEING A COUNSELOR EVERY OTHER DAY FOR HER ISSUES.  SHE HAS SPOKEN TO [employer], WHO HAS HER FMLA PAPERS.  THESE HAVE NOT BEEN COMPLETED YET.  SHE SAYS SHE JUST DOES NOT THINK SHE WILL BE ABLE TO HANDLE RETURNING TO WORK ON Monday.  PLEASE ADVISE IF WE CAN EXTEND HER OUT OF WORK NOTE OR SHOULD SHE COME IN TO HAVE THIS DONE?  SAW EGAN FOR THIS INITIALLY.  [phone]

## 2013-01-11 NOTE — Telephone Encounter (Signed)
Letter printed. I have authorized another week. If it is going to need to be extended again, will need OV

## 2013-01-11 NOTE — Telephone Encounter (Signed)
Spoke with pt advised letter up front to be picked up.

## 2013-09-30 ENCOUNTER — Ambulatory Visit (INDEPENDENT_AMBULATORY_CARE_PROVIDER_SITE_OTHER): Payer: 59 | Admitting: Physician Assistant

## 2013-09-30 VITALS — BP 118/76 | HR 96 | Temp 98.3°F | Resp 20 | Ht 63.5 in | Wt 175.8 lb

## 2013-09-30 DIAGNOSIS — J45901 Unspecified asthma with (acute) exacerbation: Secondary | ICD-10-CM

## 2013-09-30 DIAGNOSIS — E119 Type 2 diabetes mellitus without complications: Secondary | ICD-10-CM

## 2013-09-30 DIAGNOSIS — E66811 Obesity, class 1: Secondary | ICD-10-CM | POA: Insufficient documentation

## 2013-09-30 DIAGNOSIS — J209 Acute bronchitis, unspecified: Secondary | ICD-10-CM

## 2013-09-30 DIAGNOSIS — E669 Obesity, unspecified: Secondary | ICD-10-CM | POA: Insufficient documentation

## 2013-09-30 LAB — LIPID PANEL
HDL: 35 mg/dL — ABNORMAL LOW (ref >39)
LDL Cholesterol: 96 mg/dL (ref 0–99)

## 2013-09-30 LAB — COMPREHENSIVE METABOLIC PANEL
ALT: 15 U/L (ref 0–35)
CO2: 27 mEq/L (ref 19–32)
Chloride: 100 mEq/L (ref 96–112)
Potassium: 4.6 mEq/L (ref 3.5–5.3)
Sodium: 136 mEq/L (ref 135–145)
Total Bilirubin: 0.4 mg/dL (ref 0.3–1.2)
Total Protein: 7.4 g/dL (ref 6.0–8.3)

## 2013-09-30 LAB — GLUCOSE, POCT (MANUAL RESULT ENTRY): POC Glucose: 311 mg/dl — AB (ref 70–99)

## 2013-09-30 MED ORDER — ONETOUCH ULTRA SYSTEM W/DEVICE KIT: 1.0000 | PACK | Freq: Once | Status: DC

## 2013-09-30 MED ORDER — ALBUTEROL SULFATE HFA 108 (90 BASE) MCG/ACT IN AERS: 2.0000 | INHALATION_SPRAY | Freq: Four times a day (QID) | RESPIRATORY_TRACT | Status: DC | PRN

## 2013-09-30 MED ORDER — METFORMIN HCL ER 500 MG PO TB24: ORAL_TABLET | ORAL | Status: DC

## 2013-09-30 MED ORDER — AZITHROMYCIN 250 MG PO TABS: ORAL_TABLET | ORAL | Status: AC

## 2013-09-30 MED ORDER — GLUCOSE BLOOD VI STRP: ORAL_STRIP | Status: DC

## 2013-09-30 MED ORDER — ONETOUCH ULTRASOFT LANCETS MISC: Status: DC

## 2013-09-30 MED ORDER — HYDROCOD POLST-CHLORPHEN POLST 10-8 MG/5ML PO LQCR: 5.0000 mL | Freq: Two times a day (BID) | ORAL | Status: AC

## 2013-09-30 NOTE — Progress Notes (Signed)
[address] [place] [address]   Phone [phone]  Subjective:    Patient ID: Julia [name], female    DOB: [birth year], 42 y.o.   MRN: [id number]  HPI Pt presents to clinic with 2 concerns 1- she has had a cold for the last week and it does not seem to be getting better despite her constant use of Mucinex.  She has a cough that is productive but she cannot get anything up high enough to spit out - she feels shortness of breath and has used her sons inhaler which helps her breathing at night - she has not heard her wheezing.  She had congestion at the beginning of the illness but that has resolved though she states she may still have some PND.  The cough is worse at night. 2- she is a known diabetic but stopped her medications months ago because she is in denial about the disease condition and over the last year has ignored the fact that she has it and will sometimes eat sweets out of spite of having DM.  She knows that she needs to do something about it now but really needs a single provider that will coach her through this process.  She finds that when she has something that she is worried about or is hard she will try to ignore it in hopes that it will go away - she finds she has donee that with her DM, her marriage and things at work.  She worries a lot about everything.  She has been on Celexa in the past but did not feel like it helped anything.  She does not like to be on medications and will try just about anything to not be on meds.  She is not really sure how to eat correctly and thinks that she is ready now.  Sick contacts at home OTC med - Mucinex DM Review of Systems  Constitutional: Negative for fever and chills.  HENT: Positive for congestion, postnasal drip (worse at night) and sore throat. Negative for rhinorrhea.   Respiratory: Positive for cough and shortness of breath.        Objective:   Physical Exam  Vitals reviewed. Constitutional: She is oriented to person, place,  and time. She appears well-developed and well-nourished.  HENT:  Head: Normocephalic and atraumatic.  Right Ear: External ear normal.  Left Ear: External ear normal.  Eyes: Conjunctivae are normal.  Neck: Normal range of motion.  Cardiovascular: Normal rate, regular rhythm and normal heart sounds.   No murmur heard. Pulmonary/Chest: Effort normal and breath sounds normal. She has no wheezes.  Neurological: She is alert and oriented to person, place, and time.  Skin: Skin is warm and dry.  Psychiatric: She has a normal mood and affect. Her behavior is normal. Judgment and thought content normal.       Assessment & Plan:  Diabetes mellitus, type 2 - We discussed what our goals are over the next 3 months - she may need more than bid Metformin but she is so hesitant of taking pills we are going to progress slowly to not overwhelm the patient and have her stop her treatment She will go to DM teaching services.  I think the patient is having some component of anxiety and she will seek counseling for the role of anxiety in her inability to deal with things.  For this moment will not start meds due to her medication hesitancy. -Plan: Comprehensive metabolic panel, POCT glucose (manual  entry), POCT glycosylated hemoglobin (Hb A1C), Lipid panel, metFORMIN (GLUCOPHAGE-XR) 500 MG 24 hr tablet, Blood Glucose Monitoring Suppl (ONE TOUCH ULTRA SYSTEM KIT) W/DEVICE KIT, glucose blood test strip, Lancets (ONETOUCH ULTRASOFT) lancets, Ambulatory referral to diabetic education  Obesity, Class I, BMI 30-34.9  Asthma exacerbation, mild  Acute bronchitis - Plan: albuterol (PROVENTIL HFA;VENTOLIN HFA) 108 (90 BASE) MCG/ACT inhaler, chlorpheniramine-HYDROcodone (TUSSIONEX PENNKINETIC ER) 10-8 MG/5ML LQCR, azithromycin (ZITHROMAX Z-PAK) 250 MG tablet  She will call in 1 month with her glucose readings and we may increase her metformin at that point  She understands and agrees with the above plan.  Benny Lennert  PA-C 09/30/2013 1:38 PM

## 2013-09-30 NOTE — Patient Instructions (Signed)
Recheck in 3 months.  Check your sugar once a day and keep a record of it with the time that you take it.  [facility] - [facility] - [phone] Kara Dies - [phone] Verlan Friends - [phone]

## 2013-10-01 ENCOUNTER — Encounter: Payer: Self-pay | Admitting: Physician Assistant

## 2013-10-01 MED ORDER — FLUCONAZOLE 150 MG PO TABS: 150.0000 mg | ORAL_TABLET | Freq: Once | ORAL | Status: DC

## 2013-10-01 NOTE — Telephone Encounter (Signed)
Sent in the diflucan, please advise on the nasal spray

## 2013-10-01 NOTE — Telephone Encounter (Signed)
Error

## 2013-10-02 ENCOUNTER — Encounter: Payer: Self-pay | Admitting: Physician Assistant

## 2013-10-09 ENCOUNTER — Other Ambulatory Visit: Payer: Self-pay

## 2013-11-12 ENCOUNTER — Telehealth: Payer: Self-pay | Admitting: Physician Assistant

## 2013-11-12 NOTE — Telephone Encounter (Signed)
Please call patient and let her know that I received a fax from her pharmacy about restarting her Lisinopril - at her last visit her BP was really good.  If she has been checking her BP at home please get those readings are me if she has not lets not restart medication until her recheck.

## 2013-11-13 NOTE — Telephone Encounter (Signed)
Called her, left message for her to call me back.  

## 2013-11-17 NOTE — Telephone Encounter (Signed)
Left another message. Denial was sent to pharmacy to have patient contact us as well.

## 2014-01-08 ENCOUNTER — Encounter: Payer: 59 | Attending: Physician Assistant

## 2014-01-15 ENCOUNTER — Ambulatory Visit: Payer: 59

## 2014-01-22 ENCOUNTER — Ambulatory Visit: Payer: 59

## 2014-01-23 ENCOUNTER — Other Ambulatory Visit: Payer: Self-pay | Admitting: Physician Assistant

## 2014-07-13 ENCOUNTER — Encounter (HOSPITAL_COMMUNITY): Payer: Self-pay | Admitting: Emergency Medicine

## 2014-07-13 ENCOUNTER — Emergency Department (HOSPITAL_COMMUNITY)
Admission: EM | Admit: 2014-07-13 | Discharge: 2014-07-13 | Disposition: A | Discharge status: Home / Self Care | Payer: 59 | Attending: Emergency Medicine | Admitting: Emergency Medicine

## 2014-07-13 DIAGNOSIS — IMO0002 Reserved for concepts with insufficient information to code with codable children: Secondary | ICD-10-CM | POA: Insufficient documentation

## 2014-07-13 DIAGNOSIS — E1365 Other specified diabetes mellitus with hyperglycemia: Secondary | ICD-10-CM | POA: Diagnosis not present

## 2014-07-13 DIAGNOSIS — R7309 Other abnormal glucose: Secondary | ICD-10-CM | POA: Diagnosis not present

## 2014-07-13 DIAGNOSIS — Z87891 Personal history of nicotine dependence: Secondary | ICD-10-CM | POA: Diagnosis not present

## 2014-07-13 DIAGNOSIS — I471 Supraventricular tachycardia, unspecified: Secondary | ICD-10-CM | POA: Insufficient documentation

## 2014-07-13 DIAGNOSIS — Z79899 Other long term (current) drug therapy: Secondary | ICD-10-CM | POA: Diagnosis not present

## 2014-07-13 DIAGNOSIS — R002 Palpitations: Secondary | ICD-10-CM

## 2014-07-13 DIAGNOSIS — I10 Essential (primary) hypertension: Secondary | ICD-10-CM | POA: Insufficient documentation

## 2014-07-13 DIAGNOSIS — R000 Tachycardia, unspecified: Secondary | ICD-10-CM | POA: Diagnosis present

## 2014-07-13 DIAGNOSIS — R739 Hyperglycemia, unspecified: Secondary | ICD-10-CM

## 2014-07-13 LAB — BASIC METABOLIC PANEL
Anion gap: 16 — ABNORMAL HIGH (ref 5–15)
BUN: 12 mg/dL (ref 6–23)
CHLORIDE: 96 meq/L (ref 96–112)
CO2: 23 meq/L (ref 19–32)
Calcium: 9.1 mg/dL (ref 8.4–10.5)
Creatinine, Ser: 0.52 mg/dL (ref 0.50–1.10)
GFR calc non Af Amer: >90 mL/min (ref >90)
Glucose, Bld: 379 mg/dL — ABNORMAL HIGH (ref 70–99)
Potassium: 4 mEq/L (ref 3.7–5.3)
Sodium: 135 mEq/L — ABNORMAL LOW (ref 137–147)

## 2014-07-13 LAB — CBC
HEMATOCRIT: 46.8 % — AB (ref 36.0–46.0)
Hemoglobin: 16.4 g/dL — ABNORMAL HIGH (ref 12.0–15.0)
MCH: 28.9 pg (ref 26.0–34.0)
MCHC: 35 g/dL (ref 30.0–36.0)
MCV: 82.4 fL (ref 78.0–100.0)
PLATELETS: 262 10*3/uL (ref 150–400)
RBC: 5.68 MIL/uL — AB (ref 3.87–5.11)
RDW: 13.2 % (ref 11.5–15.5)
WBC: 9.1 10*3/uL (ref 4.0–10.5)

## 2014-07-13 LAB — CBG MONITORING, ED: Glucose-Capillary: 334 mg/dL — ABNORMAL HIGH (ref 70–99)

## 2014-07-13 MED ORDER — ADENOSINE 6 MG/2ML IV SOLN
6.0000 mg | Freq: Once | INTRAVENOUS | Status: AC
  Administered 2014-07-13 (×2): 6 mg via INTRAVENOUS

## 2014-07-13 MED ORDER — SODIUM CHLORIDE 0.9 % IV BOLUS (SEPSIS)
1000.0000 mL | Freq: Once | INTRAVENOUS | Status: AC
  Administered 2014-07-13: 1000 mL via INTRAVENOUS

## 2014-07-13 MED ORDER — ADENOSINE 6 MG/2ML IV SOLN
INTRAVENOUS | Status: AC
  Administered 2014-07-13: 6 mg via INTRAVENOUS
  Filled 2014-07-13: qty 2

## 2014-07-13 NOTE — ED Notes (Signed)
Multiple vagal attempts without change in rate.

## 2014-07-13 NOTE — ED Notes (Signed)
Sinus tach after adenosine. Pt tolerated well.

## 2014-07-13 NOTE — ED Notes (Addendum)
Pt reports was at meeting had just had lunch; felt her heart racing. Pt speaking in clear sentences, is a x 4. HR 225 SVT. Denies pain.

## 2014-07-13 NOTE — ED Provider Notes (Signed)
CSN: [id number]     Arrival date & time 07/13/14  1400 History   First MD Initiated Contact with Patient 07/13/14 1412     Chief Complaint  Patient presents with  . Tachycardia     (Consider location/radiation/quality/duration/timing/severity/associated sxs/prior Treatment) HPI Comments: 43 year old female with history of diabetes, high blood pressure, obesity presents with palpitations and rapid heart rate. Started just after 1:00 today. He said similar episode it was brief one on its own. No recent infectious symptoms, no chest pain or shortness of breath, no recent surgeries or blood clot history. Patient fell heart racing after lunch today. Nothing improves it, patient) down.  The history is provided by the patient.    Past Medical History  Diagnosis Date  . Diabetes mellitus   . HTN (hypertension)    Past Surgical History  Procedure Laterality Date  . Cesarean section     Family History  Problem Relation Age of Onset  . Hypertension Mother   . Thyroid disease Mother   . Thyroid disease Sister    History  Substance Use Topics  . Smoking status: Former Smoker    Types: Cigarettes    Quit date: 12/16/2002  . Smokeless tobacco: Not on file  . Alcohol Use: 1.0 oz/week    2 drink(s) per week   OB History   Grav Para Term Preterm Abortions TAB SAB Ect Mult Living                 Review of Systems  Constitutional: Negative for fever and chills.  HENT: Negative for congestion.   Eyes: Negative for visual disturbance.  Respiratory: Negative for shortness of breath.   Cardiovascular: Positive for palpitations. Negative for chest pain.  Gastrointestinal: Negative for vomiting and abdominal pain.  Genitourinary: Negative for dysuria and flank pain.  Musculoskeletal: Negative for back pain, neck pain and neck stiffness.  Skin: Negative for rash.  Neurological: Negative for light-headedness and headaches.      Allergies  Review of patient's allergies indicates no  known allergies.  Home Medications   Prior to Admission medications   Medication Sig Start Date End Date Taking? Authorizing Provider  albuterol (PROVENTIL HFA;VENTOLIN HFA) 108 (90 BASE) MCG/ACT inhaler Inhale 2 puffs into the lungs every 6 (six) hours as needed for wheezing. 09/30/13 09/30/14  Mancel Bale, PA-C  Blood Glucose Monitoring Suppl (ONE TOUCH ULTRA SYSTEM KIT) W/DEVICE KIT 1 kit by Does not apply route once. 09/30/13   Mancel Bale, PA-C  fluconazole (DIFLUCAN) 150 MG tablet Take 1 tablet (150 mg total) by mouth once. 10/01/13   Chelle Janalee Dane, PA-C  glucose blood test strip Use as instructed 09/30/13   Mancel Bale, PA-C  ipratropium (ATROVENT) 0.06 % nasal spray Place 2 sprays into the nose 3 (three) times daily. 08/13/12 08/13/13  Mancel Bale, PA-C  Lancets [facility] ULTRASOFT) lancets Use as instructed 09/30/13   Mancel Bale, PA-C  lisinopril (PRINIVIL,ZESTRIL) 10 MG tablet TAKE 1 TABLET (10 MG TOTAL) BY MOUTH DAILY. 01/09/13   Argentina Donovan, PA-C  metFORMIN (GLUCOPHAGE-XR) 500 MG 24 hr tablet TAKE 2 TABLETS BY MOUTH DAILY, START WITH 1 UNTIL PATIENT KNOWS SHE CAN HANDLE THEN 2 A DAY 09/30/13   Mancel Bale, PA-C   LMP 07/12/2014 Physical Exam  Nursing note and vitals reviewed. Constitutional: She is oriented to person, place, and time. She appears well-developed and well-nourished.  HENT:  Head: Normocephalic and atraumatic.  Eyes: Conjunctivae are normal. Right eye exhibits  no discharge. Left eye exhibits no discharge.  Neck: Normal range of motion. Neck supple. No tracheal deviation present.  Cardiovascular: Regular rhythm.  Tachycardia present.   No murmur heard. Pulmonary/Chest: Effort normal and breath sounds normal.  Abdominal: Soft. She exhibits no distension. There is no tenderness. There is no guarding.  Musculoskeletal: She exhibits no edema.  Neurological: She is alert and oriented to person, place, and time.  Skin: Skin is warm. No rash noted.   Psychiatric: She has a normal mood and affect.    ED Course  Procedures (including critical care time) Labs Review Labs Reviewed  CBC - Abnormal; Notable for the following:    RBC 5.68 (*)    Hemoglobin 16.4 (*)    HCT 46.8 (*)    All other components within normal limits  BASIC METABOLIC PANEL - Abnormal; Notable for the following:    Sodium 135 (*)    Glucose, Bld 379 (*)    Anion gap 16 (*)    All other components within normal limits  CBG MONITORING, ED - Abnormal; Notable for the following:    Glucose-Capillary 334 (*)    All other components within normal limits    Imaging Review No results found.   EKG Interpretation   Date/Time:  Monday July 13 2014 14:22:42 EDT Ventricular Rate:  115 PR Interval:  155 QRS Duration: 79 QT Interval:  321 QTC Calculation: 444 R Axis:   9 Text Interpretation:  Sinus tachycardia Low voltage, precordial leads  Probable anteroseptal infarct, old Baseline wander in lead(s) II III aVF  V4 Confirmed by Robson Trickey  MD, Artie Takayama ([id number]) on 07/13/2014 3:40:13 PM     Initial EKG reviewed heart rate to 19, narrow QRS 74, nonspecific ST changes, different than previous, consistent SVT MDM   Final diagnoses:  Heart palpitations  SVT (supraventricular tachycardia)  Hyperglycemia  DM (diabetes mellitus), secondary uncontrolled   Patient presented with rapid heart rate, EKG reviewed consistent with SVT. No other cardiac history, patient received adenosine and heart rate returned to sinus rhythm and patient symptoms resolved. Patient no chest pain, no troponin indicated at this time. Discussed IV fluids, labs and outpatient followup for diabetic control and cardiology followup.  Patient observed in the ER, recheck patient comfortable no symptoms. Discussed outpatient followup for improved glucose management, patient agrees with plan. Results and differential diagnosis were discussed with the patient/parent/guardian. Close follow up outpatient  was discussed, comfortable with the plan.   Medications  adenosine (ADENOCARD) 6 MG/2ML injection 6 mg (6 mg Intravenous Given 07/13/14 1422)  sodium chloride 0.9 % bolus 1,000 mL (1,000 mLs Intravenous New Bag/Given 07/13/14 1540)    Filed Vitals:   07/13/14 1430  BP: 136/97  Pulse: 110  Resp: 15  SpO2: 99%         Mariea Clonts, MD 07/13/14 1656

## 2014-07-13 NOTE — ED Notes (Signed)
Pt discharge, ambulates without distress. Respirations easy non labored.

## 2014-07-13 NOTE — Discharge Instructions (Signed)
If you were given medicines take as directed.  If you are on coumadin or contraceptives realize their levels and effectiveness is altered by many different medicines.  If you have any reaction (rash, tongues swelling, other) to the medicines stop taking and see a physician.   Please follow up as directed and return to the ER or see a physician for new or worsening symptoms.  Thank you. Filed Vitals:   07/13/14 1430  BP: 136/97  Pulse: 110  Resp: 15  SpO2: 99%

## 2014-08-13 ENCOUNTER — Ambulatory Visit (INDEPENDENT_AMBULATORY_CARE_PROVIDER_SITE_OTHER): Payer: 59 | Admitting: Cardiovascular Disease

## 2014-08-13 ENCOUNTER — Encounter: Payer: Self-pay | Admitting: Cardiovascular Disease

## 2014-08-13 VITALS — BP 128/94 | HR 98 | Ht 63.5 in | Wt 178.2 lb

## 2014-08-13 DIAGNOSIS — R002 Palpitations: Secondary | ICD-10-CM

## 2014-08-13 DIAGNOSIS — I498 Other specified cardiac arrhythmias: Secondary | ICD-10-CM

## 2014-08-13 DIAGNOSIS — E119 Type 2 diabetes mellitus without complications: Secondary | ICD-10-CM

## 2014-08-13 DIAGNOSIS — I4949 Other premature depolarization: Secondary | ICD-10-CM

## 2014-08-13 DIAGNOSIS — I493 Ventricular premature depolarization: Secondary | ICD-10-CM

## 2014-08-13 DIAGNOSIS — I471 Supraventricular tachycardia: Secondary | ICD-10-CM | POA: Insufficient documentation

## 2014-08-13 LAB — TSH: TSH: 0.55 u[IU]/mL (ref 0.35–4.50)

## 2014-08-13 LAB — BASIC METABOLIC PANEL
BUN: 15 mg/dL (ref 6–23)
CO2: 24 mEq/L (ref 19–32)
Calcium: 9.3 mg/dL (ref 8.4–10.5)
Chloride: 99 mEq/L (ref 96–112)
Creatinine, Ser: 0.6 mg/dL (ref 0.4–1.2)
GFR: 121.89 mL/min (ref >60.00)
Glucose, Bld: 326 mg/dL — ABNORMAL HIGH (ref 70–99)
POTASSIUM: 4 meq/L (ref 3.5–5.1)
SODIUM: 132 meq/L — AB (ref 135–145)

## 2014-08-13 LAB — MAGNESIUM: MAGNESIUM: 1.9 mg/dL (ref 1.5–2.5)

## 2014-08-13 MED ORDER — METOPROLOL TARTRATE 25 MG PO TABS: 25.0000 mg | ORAL_TABLET | Freq: Two times a day (BID) | ORAL | Status: DC

## 2014-08-13 NOTE — Assessment & Plan Note (Signed)
Videl presents for further evaluation and management of her supraventricular tachycardia. She's had palpitations in the past but has never had a prolonged episode of SVT. Presents after a two-hour episode of tachycardia with heart rate of around 220. The SVT was converted to sinus rhythm with IV adenosine. He did not start her on any additional medications.  She is continued to have some palpitations which I think are due to premature ventricular contractions.  We'll start her on metoprolol 25 mg twice a day. We'll check a TSH, magnesium level, basic metabolic profile to make sure that her left light throat A. She needs it her glucose under control. Her most recent glucose levels have been in the 300 range.  She's been under quite a bit of stress and was upset when I does tried  to discuss her glucose control of her. She'll discuss this further with her medical Dr.  Diamantina Providence see her again in 3 months for followup visit.

## 2014-08-13 NOTE — Progress Notes (Signed)
     Veida Marybelle Killings Date of Birth  [birth year]       [facility] [address], [place], [place]  [address]   [place], [place]  [address] [address]   Fax  [phone]     Fax [phone]  Problem List: 1. Supraventricular tachycardia 2. Hypertension  3. Diabetes mellitus  History of Present Illness:  Suhani is seen today for follow up of SVT. See her in the past for evaluation of some chest discomfort. She normal stress echocardiogram in 2010. She had normal left ventricular systolic function at that time.  She is here to follow up for an episode of SVT.   It started suddenly while at work.  She had been drinking lots of diet mountain dew.  ( has since cut back). She had stopped her metformin but ha restarted.    She has had lots of palpitations  Current Outpatient Prescriptions on File Prior to Visit  Medication Sig Dispense Refill  . Blood Glucose Monitoring Suppl (ONE TOUCH ULTRA SYSTEM KIT) W/DEVICE KIT 1 kit by Does not apply route once.  1 each  0  . glucose blood test strip Use as instructed  100 each  12  . Lancets (ONETOUCH ULTRASOFT) lancets Use as instructed  100 each  12  . metFORMIN (GLUCOPHAGE-XR) 500 MG 24 hr tablet Take 500 mg by mouth 2 (two) times daily.       No current facility-administered medications on file prior to visit.    No Known Allergies  Past Medical History  Diagnosis Date  . Diabetes mellitus   . HTN (hypertension)     Past Surgical History  Procedure Laterality Date  . Cesarean section      History  Smoking status  . Former Smoker  . Types: Cigarettes  . Quit date: 12/16/2002  Smokeless tobacco  . Not on file    History  Alcohol Use  . 1.0 oz/week  . 2 drink(s) per week    Family History  Problem Relation Age of Onset  . Hypertension Mother   . Thyroid disease Mother   . Thyroid disease Sister     Reviw of Systems:  Reviewed in the  HPI.  All other systems are negative.  Physical Exam: Blood pressure 128/94, pulse 98, height 5' 3.5" (1.613 m), weight 178 lb 3.2 oz (80.831 kg). Wt Readings from Last 3 Encounters:  08/13/14 178 lb 3.2 oz (80.831 kg)  09/30/13 175 lb 12.8 oz (79.742 kg)  01/02/13 172 lb 3.2 oz (78.109 kg)     General: Well developed, well nourished, in no acute distress.  Head: Normocephalic, atraumatic, sclera non-icteric, mucus membranes are moist,   Neck: Supple. Carotids are 2 + without bruits. No JVD   Lungs: Clear   Heart: RR, normla s1S2  Abdomen: Soft, non-tender, non-distended with normal bowel sounds.  Msk:  Strength and tone are normal   Extremities: No clubbing or cyanosis. No edema.  Distal pedal pulses are 2+ and equal    Neuro: CN II - XII intact.  Alert and oriented X 3.   Psych:  Normal   ECG: In the ER .  Narrow complex tachycardia ( SVT) at a rate of 220 ECG #2 - sinus tach at 115.   Assessment / Plan:

## 2014-08-13 NOTE — Patient Instructions (Signed)
Your physician has recommended you make the following change in your medication:   1. Start Metoprolol Tartrate 25 mg 1 tablet twice a day.  Your physician recommends that you return for lab work today for TSH, Bmet, and Magnesium level.  Your physician recommends that you schedule a follow-up appointment in: 3 months with Dr. Elease Hashimoto.

## 2014-08-13 NOTE — Assessment & Plan Note (Signed)
She's been on ACE inhibitor ( for renal protection given her hx of diabetes mellitus)   in the past but has not been taking it for the past several months. I anticipate restarting her low-dose ACE inhibitor in the future but I would like to see what her blood pressures after she stabilizes on the current dose of metoprolol.

## 2014-08-14 ENCOUNTER — Other Ambulatory Visit: Payer: Self-pay | Admitting: *Deleted

## 2014-08-14 MED ORDER — METOPROLOL TARTRATE 25 MG PO TABS: 25.0000 mg | ORAL_TABLET | Freq: Two times a day (BID) | ORAL | Status: DC

## 2014-08-17 ENCOUNTER — Ambulatory Visit (INDEPENDENT_AMBULATORY_CARE_PROVIDER_SITE_OTHER): Payer: 59 | Admitting: Family Medicine

## 2014-08-17 VITALS — BP 136/82 | HR 100 | Temp 98.1°F | Resp 18 | Ht 64.0 in | Wt 177.0 lb

## 2014-08-17 DIAGNOSIS — B379 Candidiasis, unspecified: Secondary | ICD-10-CM

## 2014-08-17 DIAGNOSIS — IMO0001 Reserved for inherently not codable concepts without codable children: Secondary | ICD-10-CM

## 2014-08-17 DIAGNOSIS — E1165 Type 2 diabetes mellitus with hyperglycemia: Secondary | ICD-10-CM

## 2014-08-17 DIAGNOSIS — IMO0002 Reserved for concepts with insufficient information to code with codable children: Secondary | ICD-10-CM

## 2014-08-17 DIAGNOSIS — R002 Palpitations: Secondary | ICD-10-CM

## 2014-08-17 DIAGNOSIS — J018 Other acute sinusitis: Secondary | ICD-10-CM

## 2014-08-17 LAB — POCT GLYCOSYLATED HEMOGLOBIN (HGB A1C): HEMOGLOBIN A1C: 11.7

## 2014-08-17 LAB — GLUCOSE, POCT (MANUAL RESULT ENTRY): POC Glucose: 373 mg/dl — AB (ref 70–99)

## 2014-08-17 MED ORDER — GLIPIZIDE 5 MG PO TABS: 5.0000 mg | ORAL_TABLET | Freq: Two times a day (BID) | ORAL | Status: DC

## 2014-08-17 MED ORDER — DOXYCYCLINE HYCLATE 100 MG PO CAPS: 100.0000 mg | ORAL_CAPSULE | Freq: Two times a day (BID) | ORAL | Status: DC

## 2014-08-17 MED ORDER — FLUTICASONE PROPIONATE 50 MCG/ACT NA SUSP: 2.0000 | Freq: Every day | NASAL | Status: DC

## 2014-08-17 MED ORDER — FLUCONAZOLE 150 MG PO TABS: 150.0000 mg | ORAL_TABLET | Freq: Once | ORAL | Status: DC

## 2014-08-17 NOTE — Progress Notes (Signed)
Subjective:    Patient ID: Julia [name], female    DOB: 02-07-1971, 43 y.o.   MRN: [id number]  HPI This is a pleasant 43 yo female who is accompanied by her husband. The patient presents today for follow up of DM. She has been on metformin xr 500 mg po BID. Has been checking her blood sugar which has been 240-300s.   24 hour diet recall- eggs/sausage/gravy, chicken, shrimp, veggies, dip/fritos, hostess cupcakes. She has had a diabetes diagnosis for many years and met with a dietician when she was first diagnosed. Her husband also has DM2 and they are interested in attending diabetic education. The patient works for [employer] and reports that her job is very demanding and very stressful. This leads to poor food choices and lack of time to exercise and do things that would decrease her stress.   Patient with nasal congestion, burning x 1 week, right sided headache. This morning had some right sided facial numbness, resolved now. Has seasonal allergies and has frequent nasal congestion. She has been taking Mucinex DM and a daytime sinus medication that she thinks contains a decongestant.   Has a history of heart palpitations and feels some racing of her heart today. She has been seen by cardiology and was started on metoprolol last week. She is not sure if it is helping. She was told by the cardiologist that it is ok for her to walk for exercise.   Review of Systems No fever, no chest pain, no SOB, +nasal drainage/post nasal drainage,     Objective:   Physical Exam  Vitals reviewed. Constitutional: She is oriented to person, place, and time. She appears well-developed and well-nourished.  HENT:  Head: Normocephalic and atraumatic.  Right Ear: Tympanic membrane, external ear and ear canal normal.  Left Ear: Tympanic membrane, external ear and ear canal normal.  Nose: Mucosal edema and rhinorrhea present. Right sinus exhibits maxillary sinus tenderness. Right sinus exhibits no frontal sinus  tenderness. Left sinus exhibits maxillary sinus tenderness. Left sinus exhibits no frontal sinus tenderness.  Mouth/Throat: Uvula is midline and mucous membranes are normal. Posterior oropharyngeal erythema present. No oropharyngeal exudate, posterior oropharyngeal edema or tonsillar abscesses.  Eyes: Conjunctivae are normal. Pupils are equal, round, and reactive to light.  Neck: Normal range of motion. Neck supple.  Cardiovascular: Normal rate, regular rhythm and normal heart sounds.   Pulmonary/Chest: Effort normal and breath sounds normal.  Musculoskeletal: Normal range of motion.  Lymphadenopathy:    She has no cervical adenopathy.  Neurological: She is alert and oriented to person, place, and time.  Skin: Skin is warm and dry.  Psychiatric: She has a normal mood and affect. Her behavior is normal. Judgment and thought content normal.   EKG- reviewed with Dr. Everlene Farrier, NSR, rate 83 Results for orders placed in visit on 08/17/14  GLUCOSE, POCT (MANUAL RESULT ENTRY)      Result Value Ref Range   POC Glucose 373 (*) 70 - 99 mg/dl  POCT GLYCOSYLATED HEMOGLOBIN (HGB A1C)      Result Value Ref Range   Hemoglobin A1C 11.7        Assessment & Plan:  1. Heart palpitations - EKG 12-Lead- normal -suspect some of her recent feelings of palpitations could be related to decongestant use. Discussed avoidance of decongestants.  -continue metoprolol per cardiology  2. Diabetes mellitus type 2, uncontrolled - POCT glucose (manual entry) - POCT glycosylated hemoglobin (Hb A1C) -discussed results of HbA1C and glucose with patient. She  states she is ready to work on her diet and exercise and attend diabetic education. - glipiZIDE (GLUCOTROL) 5 MG tablet; Take 1 tablet (5 mg total) by mouth 2 (two) times daily before a meal.  Dispense: 60 tablet; Refill: 1 - Ambulatory referral to diabetic education - Follow up in 1 month, bring blood sugar readings  3. Other acute sinusitis - doxycycline  (VIBRAMYCIN) 100 MG capsule; Take 1 capsule (100 mg total) by mouth 2 (two) times daily.  Dispense: 20 capsule; Refill: 0 - fluticasone (FLONASE) 50 MCG/ACT nasal spray; Place 2 sprays into both nostrils daily.  Dispense: 16 g; Refill: 6  4. Yeast infection - fluconazole (DIFLUCAN) 150 MG tablet; Take 1 tablet (150 mg total) by mouth once. Repeat if needed  Dispense: 2 tablet; Refill: 0   Elby Beck, FNP-BC  [facility]  08/18/2014 9:21 PM

## 2014-08-18 ENCOUNTER — Telehealth: Payer: Self-pay | Admitting: *Deleted

## 2014-08-18 ENCOUNTER — Ambulatory Visit (INDEPENDENT_AMBULATORY_CARE_PROVIDER_SITE_OTHER): Payer: 59 | Admitting: Family Medicine

## 2014-08-18 VITALS — BP 128/88 | HR 95 | Temp 97.5°F | Resp 16 | Ht 64.0 in | Wt 177.0 lb

## 2014-08-18 DIAGNOSIS — E1165 Type 2 diabetes mellitus with hyperglycemia: Secondary | ICD-10-CM

## 2014-08-18 DIAGNOSIS — J019 Acute sinusitis, unspecified: Secondary | ICD-10-CM

## 2014-08-18 DIAGNOSIS — G510 Bell's palsy: Secondary | ICD-10-CM

## 2014-08-18 DIAGNOSIS — IMO0001 Reserved for inherently not codable concepts without codable children: Secondary | ICD-10-CM

## 2014-08-18 MED ORDER — PREDNISONE 20 MG PO TABS: ORAL_TABLET | ORAL | Status: DC

## 2014-08-18 MED ORDER — CANAGLIFLOZIN 100 MG PO TABS: 100.0000 mg | ORAL_TABLET | Freq: Every day | ORAL | Status: DC

## 2014-08-18 MED ORDER — VALACYCLOVIR HCL 1 G PO TABS: 1000.0000 mg | ORAL_TABLET | Freq: Three times a day (TID) | ORAL | Status: DC

## 2014-08-18 MED ORDER — METFORMIN HCL ER (MOD) 1000 MG PO TB24: 2000.0000 mg | ORAL_TABLET | Freq: Every day | ORAL | Status: DC

## 2014-08-18 NOTE — Patient Instructions (Addendum)
Liquid or gel formulations of artificial tears should be applied every hour while you are awake, and ointment formulations (eg, Soothe), which contain mineral oil and white petrolatum, should be used at night. Protective glasses or goggles should be worn at night and when you are outside. Patches can be used at night, but tape should not be placed directly on the eyelid since the patch could slip and abrade the cornea.    Bell's Palsy Bell's palsy is a condition in which the muscles on one side of the face cannot move (paralysis). This is because the nerves in the face are paralyzed. It is most often thought to be caused by a virus. The virus causes swelling of the nerve that controls movement on one side of the face. The nerve travels through a tight space surrounded by bone. When the nerve swells, it can be compressed by the bone. This results in damage to the protective covering around the nerve. This damage interferes with how the nerve communicates with the muscles of the face. As a result, it can cause weakness or paralysis of the facial muscles.  Injury (trauma), tumor, and surgery may cause Bell's palsy, but most of the time the cause is unknown. It is a relatively common condition. It starts suddenly (abrupt onset) with the paralysis usually ending within 2 days. Bell's palsy is not dangerous. But because the eye does not close properly, you may need care to keep the eye from getting dry. This can include splinting (to keep the eye shut) or moistening with artificial tears. Bell's palsy very seldom occurs on both sides of the face at the same time. SYMPTOMS   Eyebrow sagging.  Drooping of the eyelid and corner of the mouth.  Inability to close one eye.  Loss of taste on the front of the tongue.  Sensitivity to loud noises. TREATMENT  The treatment is usually non-surgical. If the patient is seen within the first 24 to 48 hours, a short course of steroids may be prescribed, in an attempt to  shorten the length of the condition. Antiviral medicines may also be used with the steroids, but it is unclear if they are helpful.  You will need to protect your eye, if you cannot close it. The cornea (clear covering over your eye) will become dry and can be damaged. Artificial tears can be used to keep your eye moist. Glasses or an eye patch should be worn to protect your eye. PROGNOSIS  Recovery is variable, ranging from days to months. Although the problem usually goes away completely (about 80% of cases resolve), predicting the outcome is impossible. Most people improve within 3 weeks of when the symptoms began. Improvement may continue for 3 to 6 months. A small number of people have moderate to severe weakness that is permanent.  HOME CARE INSTRUCTIONS   If your caregiver prescribed medication to reduce swelling in the nerve, use as directed. Do not stop taking the medication unless directed by your caregiver.  Use moisturizing eye drops as needed to prevent drying of your eye, as directed by your caregiver.  Protect your eye, as directed by your caregiver.  Use facial massage and exercises, as directed by your caregiver.  Perform your normal activities, and get your normal rest. SEEK IMMEDIATE MEDICAL CARE IF:   There is pain, redness or irritation in the eye.  You or your child has an oral temperature above 102 F (38.9 C), not controlled by medicine. MAKE SURE YOU:   Understand  these instructions.  Will watch your condition.  Will get help right away if you are not doing well or get worse. Document Released: 11/20/2005 Document Revised: 02/12/2012 Document Reviewed: 02/27/2014 [facility] Patient Information 2015 [place], [place]. This information is not intended to replace advice given to you by your health care provider. Make sure you discuss any questions you have with your health care provider.

## 2014-08-18 NOTE — Telephone Encounter (Signed)
Pt advised to RTC- she called with concerns since starting her medication yesterday pt has been experiencing paralysis slightly on one side of her face. Pt is on her way now.

## 2014-08-20 ENCOUNTER — Telehealth: Payer: Self-pay | Admitting: Cardiovascular Disease

## 2014-08-20 NOTE — Telephone Encounter (Signed)
Spoke with patient who states she continues to have frequent palpitations; states at times she is miserable due to the discomfort of these.  Patient states she is also concerned about her BP which has been 140/100, 140/92 and other readings of diastolic in the 90's.  Patient reports she was diagnosed with Bell's Palsy last week and she is currently on Prednisone and other medications prescribed by PCP, in addition to changing medications for glucose control.  Patient reports she is taking Metoprolol 25 mg every 12 hours.  Patient states she has decreased her caffeine intake and is drinking more water.  Patient reports she has noted a slight improvement in symptoms since seeing Dr. Elease Hashimoto last week.  After our discussion, patient agreed that with all the other issues going on that she should give the Metoprolol a little longer to see if it is more effective before starting another medication.  I advised patient to call me back next week if symptoms do not improve.  Patient reports heart rate in the range of 80's to 100's.  I advised her to call our office sooner if heart rate remains above 110's.  Patient verbalized understanding and agreement with plan of care and I am sending message to Dr. Elease Hashimoto for his awareness and additional advice.

## 2014-08-20 NOTE — Telephone Encounter (Signed)
I agree with the note from [facility].  Julia [name] can try an extra metopolol on occasion to see if that helps. The prednisone could be causing fluid retention and may be contributing to her elevated BP

## 2014-08-20 NOTE — Telephone Encounter (Signed)
New message    Patient calling C/O palpation every day . Was recent prescribe metoprolol  25 mg .

## 2014-08-21 ENCOUNTER — Encounter: Payer: Self-pay | Admitting: Family Medicine

## 2014-08-21 ENCOUNTER — Ambulatory Visit (INDEPENDENT_AMBULATORY_CARE_PROVIDER_SITE_OTHER): Payer: 59 | Admitting: Family Medicine

## 2014-08-21 VITALS — BP 128/92 | HR 98 | Temp 97.2°F | Resp 16 | Ht 64.25 in | Wt 172.4 lb

## 2014-08-21 DIAGNOSIS — E1165 Type 2 diabetes mellitus with hyperglycemia: Secondary | ICD-10-CM

## 2014-08-21 DIAGNOSIS — E785 Hyperlipidemia, unspecified: Secondary | ICD-10-CM

## 2014-08-21 DIAGNOSIS — I10 Essential (primary) hypertension: Secondary | ICD-10-CM

## 2014-08-21 DIAGNOSIS — G510 Bell's palsy: Secondary | ICD-10-CM

## 2014-08-21 DIAGNOSIS — E119 Type 2 diabetes mellitus without complications: Secondary | ICD-10-CM

## 2014-08-21 LAB — GLUCOSE, POCT (MANUAL RESULT ENTRY): POC GLUCOSE: 117 mg/dL — AB (ref 70–99)

## 2014-08-21 LAB — LIPID PANEL
Cholesterol: 255 mg/dL — ABNORMAL HIGH (ref 0–200)
HDL: 42 mg/dL (ref >39)
Total CHOL/HDL Ratio: 6.1 Ratio
Triglycerides: 411 mg/dL — ABNORMAL HIGH (ref <150)

## 2014-08-21 MED ORDER — METHYLPREDNISOLONE 4 MG PO KIT: PACK | ORAL | Status: DC

## 2014-08-21 MED ORDER — BUTALBITAL-APAP-CAFFEINE 50-325-40 MG PO TABS: 1.0000 | ORAL_TABLET | Freq: Four times a day (QID) | ORAL | Status: DC | PRN

## 2014-08-21 MED ORDER — PROMETHAZINE HCL 25 MG PO TABS: 25.0000 mg | ORAL_TABLET | Freq: Three times a day (TID) | ORAL | Status: DC | PRN

## 2014-08-21 NOTE — Patient Instructions (Addendum)
Stop prednisone.  Take 3 tabs po the medrol this morning and 3 tabs this afternoon before 2.  Then tomorrow take 3 tabs in the morning and 2 tabs in the in the afternoon.  Then 2 tabs in the morning and 2 tabs in the afternoon, etc.  Also, you can double your metoprolol while you are on the prednisone or you can take an extra dose if you are ever having palpitations or elevated blood pressure.

## 2014-08-22 NOTE — Progress Notes (Signed)
Subjective:  This chart was scribed for Julia [name]. Julia Pulse, MD by Terressa Koyanagi, ED Scribe. This patient was seen in room 27 and the patient's care was started at 8:30 AM.     Patient ID: Julia [name], female    DOB: 1971-10-18, 43 y.o.   MRN: [id number] Chief Complaint  Patient presents with  . Follow-up    bells palsy  . Diabetes     Diabetes Hypoglycemia symptoms include headaches. Pertinent negatives for hypoglycemia include no confusion.   HPI Comments: Julia [name] is a 43 y.o. female, with medical Hx noted below, who presents to the [facility] complaining of side effects of prednisone which she started 2 days ago.  Pt was started on prednisone for bells palsy.  Pt reports that since she has been taking the prednisone she has been experiencing HAs and elevated BP. Pt states after her first dose of prednisone her BP was 184/118. Pt states she also had an episode of emesis this morning.  DMTII: Pt was seen at the clinic 3 days ago for a f/u of DM. During last visit pt reported that she has been on 500 mg PO BID; and her glucose levels were running between 240-300s. During said visit, pt's Hb A1C was 11.7. Pt reports her glucose levels are down to132 today; pt notes that she has been making healthier diet choices and her glucose levels have not been this low in a year. Pt reports she discontinued use of the Glipizide.   Pt is not an ARB but will likely start at f/u.   Current Outpatient Prescriptions on File Prior to Visit  Medication Sig Dispense Refill  . Blood Glucose Monitoring Suppl (ONE TOUCH ULTRA SYSTEM KIT) W/DEVICE KIT 1 kit by Does not apply route once.  1 each  0  . Canagliflozin 100 MG TABS Take 1 tablet (100 mg total) by mouth daily before breakfast.  90 tablet  1  . doxycycline (VIBRAMYCIN) 100 MG capsule Take 1 capsule (100 mg total) by mouth 2 (two) times daily.  20 capsule  0  . fluconazole (DIFLUCAN) 150 MG tablet Take 1 tablet (150 mg total) by mouth  once. Repeat if needed  2 tablet  0  . fluticasone (FLONASE) 50 MCG/ACT nasal spray Place 2 sprays into both nostrils daily.  16 g  6  . glucose blood test strip Use as instructed  100 each  12  . Lancets (ONETOUCH ULTRASOFT) lancets Use as instructed  100 each  12  . metFORMIN (GLUMETZA) 1000 MG (MOD) 24 hr tablet Take 2 tablets (2,000 mg total) by mouth daily with breakfast.  180 tablet  1  . metoprolol tartrate (LOPRESSOR) 25 MG tablet Take 1 tablet (25 mg total) by mouth 2 (two) times daily.  60 tablet  3  . valACYclovir (VALTREX) 1000 MG tablet Take 1 tablet (1,000 mg total) by mouth 3 (three) times daily.  21 tablet  0   No current facility-administered medications on file prior to visit.    No Known Allergies Past Medical History  Diagnosis Date  . Diabetes mellitus   . HTN (hypertension)       Review of Systems  Constitutional: Negative for fever and chills.  Gastrointestinal: Positive for nausea and vomiting.  Neurological: Positive for headaches.  Psychiatric/Behavioral: Negative for confusion.       Objective:  Physical Exam  Nursing note and vitals reviewed. Constitutional: She is oriented to person, place, and time. She  appears well-developed and well-nourished. No distress.  HENT:  Head: Normocephalic and atraumatic.  Mouth/Throat: Uvula is midline and mucous membranes are normal. No trismus in the jaw.  Tongue midline, rt mouth with decreased mobility, rt eyelid can close fully but w/ decreased strength and decreased spontaneous blinking on Rt  Eyes: Conjunctivae and EOM are normal. Pupils are equal, round, and reactive to light. Right eye exhibits no chemosis and no discharge. Left eye exhibits no chemosis, no discharge and no exudate. No scleral icterus.  Neck: Neck supple. No tracheal deviation present.  Cardiovascular: Normal rate and regular rhythm.   No murmur heard. Pulmonary/Chest: Effort normal and breath sounds normal. No respiratory distress.    Musculoskeletal: Normal range of motion.  Neurological: She is alert and oriented to person, place, and time. A cranial nerve deficit and sensory deficit is present. Coordination and gait normal. GCS eye subscore is 4. GCS verbal subscore is 5. GCS motor subscore is 6.  Skin: Skin is warm and dry.  Psychiatric: She has a normal mood and affect. Her behavior is normal.     BP 128/92  [name] 98  Temp(Src) 97.2 F (36.2 C) (Oral)  Resp 16  Ht 5' 4.25" (1.632 m)  Wt 172 lb 6.4 oz (78.2 kg)  BMI 29.36 kg/m2  SpO2 97%  LMP 08/10/2014  Results for orders placed in visit on 08/21/14  LIPID PANEL      Result Value Ref Range   Cholesterol 255 (*) 0 - 200 mg/dL   Triglycerides 411 (*) <150 mg/dL   HDL 42  >39 mg/dL   Total CHOL/HDL Ratio 6.1     VLDL NOT CALC  0 - 40 mg/dL   LDL Cholesterol NOT CALC  0 - 99 mg/dL  GLUCOSE, POCT (MANUAL RESULT ENTRY)      Result Value Ref Range   POC Glucose 117 (*) 70 - 99 mg/dl    Assessment & Plan:   Type 2 diabetes mellitus with hyperglycemia - Plan: HM Diabetes Foot Exam, POCT glucose (manual entry) - doing MUCH better since increasing metformin from 1000 mg XR to 2000 mg XR and starting invokana.    Essential hypertension - was on acei prev - just fell of med list - pt willing to restart - has some prior lisinopril at home which she can take if her BP spikes while on prednisone. Cont metoprolol 25 bid to help w/ palpitations but ok to double if needed while on prednisone  Bell's palsy - slightly improved from 3d prior but unable to tolerate prednisone - has taken 40 mg qd x 2d only so will try medrol instead and split dosing to bid - if can't tolerate ok to wean down quicker, complete course of high dose valtrax - if she finds out what med her friend was rx'ed by neurologist will call to see if she would be able to use.  Reviewed appropriate eye care.  Other and unspecified hyperlipidemia - Plan: Lipid panel - LDL at goal prior but non-HDL to high -  recheck today as fasting - will likely need to start on statin but will discuss at f/u OV in 1 mo after off of steroids and bell's palsy has resolved.  Meds ordered this encounter  Medications  . methylPREDNISolone (MEDROL, PAK,) 4 MG tablet    Sig: follow package directions    Dispense:  21 tablet    Refill:  0  . promethazine (PHENERGAN) 25 MG tablet    Sig: Take 1 tablet (  25 mg total) by mouth every 8 (eight) hours as needed for nausea or vomiting.    Dispense:  20 tablet    Refill:  0  . butalbital-acetaminophen-caffeine (FIORICET) 50-325-40 MG per tablet    Sig: Take 1-2 tablets by mouth every 6 (six) hours as needed for headache.    Dispense:  20 tablet    Refill:  0    I personally performed the services described in this documentation, which was scribed in my presence. The recorded information has been reviewed and considered, and addended by me as needed.  Delman Cheadle, MD MPH

## 2014-08-23 NOTE — Progress Notes (Signed)
Subjective:    Patient ID: Julia [name], female    DOB: August 05, 1971, 43 y.o.   MRN: [id number] Chief Complaint  Patient presents with  . Follow-up    from yesterday    HPI  Was seen yesterday for prolonged sinus congestion and pressure by NP Carlean Purl.  Pt had noticed some slight numbness on the left side of her face but this was attributed to severe sinusitis. Pt was started on doxycycline. Overnight the numbness was much worse and now her right lip is not moving well.  She can't taste with the left side of her tongue but not drooling and still able to eat.  Is having more left eye watering. Suspect she has Bells palsy as sister had the same several years ago - she still develops sxs when tired or drinking EtOH - looks the same as pt. Still with a lot of congestion in sinuses.  Sugars have been VERY high - 200s-300s.  Was taking metformin XR 534m bid and was changes to XR 10063mqhs as well as started on glipizide. However, pt's mother is doing really well on Invokana so she wonders if she would be a candidate for this med.  Past Medical History  Diagnosis Date  . Diabetes mellitus   . HTN (hypertension)    Current Outpatient Prescriptions on File Prior to Visit  Medication Sig Dispense Refill  . Blood Glucose Monitoring Suppl (ONE TOUCH ULTRA SYSTEM KIT) W/DEVICE KIT 1 kit by Does not apply route once.  1 each  0  . doxycycline (VIBRAMYCIN) 100 MG capsule Take 1 capsule (100 mg total) by mouth 2 (two) times daily.  20 capsule  0  . fluconazole (DIFLUCAN) 150 MG tablet Take 1 tablet (150 mg total) by mouth once. Repeat if needed  2 tablet  0  . fluticasone (FLONASE) 50 MCG/ACT nasal spray Place 2 sprays into both nostrils daily.  16 g  6  . glucose blood test strip Use as instructed  100 each  12  . Lancets (ONETOUCH ULTRASOFT) lancets Use as instructed  100 each  12  . metoprolol tartrate (LOPRESSOR) 25 MG tablet Take 1 tablet (25 mg total) by mouth 2 (two) times daily.  60 tablet  3    No current facility-administered medications on file prior to visit.   No Known Allergies   Review of Systems  Constitutional: Positive for fatigue. Negative for fever, chills, diaphoresis, activity change and appetite change.  HENT: Positive for congestion, ear pain, rhinorrhea, sinus pressure, sneezing and sore throat. Negative for ear discharge, nosebleeds, postnasal drip, trouble swallowing and voice change.   Eyes: Positive for discharge. Negative for pain, redness, itching and visual disturbance.  Respiratory: Positive for cough. Negative for shortness of breath.   Cardiovascular: Negative for chest pain.  Gastrointestinal: Negative for nausea, vomiting and abdominal pain.  Musculoskeletal: Negative for neck pain and neck stiffness.  Skin: Positive for rash (scalp psoriasis). Negative for color change.  Neurological: Positive for facial asymmetry, weakness and headaches. Negative for dizziness and syncope.  Hematological: Positive for adenopathy.       Objective:  BP 128/88  Pulse 95  Temp(Src) 97.5 F (36.4 C) (Oral)  Resp 16  Ht 5' 4"  (1.626 m)  Wt 177 lb (80.287 kg)  BMI 30.37 kg/m2  SpO2 98%  LMP 08/10/2014  Physical Exam  Constitutional: She is oriented to person, place, and time. She appears well-developed and well-nourished. She appears lethargic. She appears ill. No distress.  HENT:  Head: Normocephalic and atraumatic.  Right Ear: External ear and ear canal normal. Tympanic membrane is retracted. A middle ear effusion is present.  Left Ear: External ear and ear canal normal. Tympanic membrane is retracted. A middle ear effusion is present.  Nose: Mucosal edema and rhinorrhea present. Right sinus exhibits maxillary sinus tenderness. Left sinus exhibits maxillary sinus tenderness.  Mouth/Throat: Uvula is midline and mucous membranes are normal. Posterior oropharyngeal erythema present. No oropharyngeal exudate, posterior oropharyngeal edema or tonsillar abscesses.   Eyes: Conjunctivae are normal. Right eye exhibits no discharge. Left eye exhibits no discharge. No scleral icterus.  Neck: Normal range of motion. Neck supple.  Cardiovascular: Normal rate, regular rhythm, normal heart sounds and intact distal pulses.   Pulmonary/Chest: Effort normal and breath sounds normal.  Lymphadenopathy:       Head (right side): Submandibular adenopathy present. No preauricular and no posterior auricular adenopathy present.       Head (left side): Submandibular adenopathy present. No preauricular and no posterior auricular adenopathy present.    She has no cervical adenopathy.       Right: No supraclavicular adenopathy present.       Left: No supraclavicular adenopathy present.  Neurological: She is oriented to person, place, and time. She appears lethargic. She exhibits abnormal muscle tone. Coordination and gait normal.  Decreased mobility in Rt facial nerve with decreased ability to smile/frown on Rt and decreased strength in Rt eyelid closure, though can fully close.  Skin: Skin is warm and dry. She is not diaphoretic. No erythema.  Psychiatric: She has a normal mood and affect. Her behavior is normal.          Assessment & Plan:   Bell's palsy - unfortunately, I think we have to start pt on steroids in combination with the valtrex. Per UTD no sig improvement with antivirals alone. UTD recommend 60-75m qd x 5d and taper for a total of 10d.  Continue to monitor sugars closely and if cbgs very uncontrolled we may need to stop or taper prednisone more quickly. Rehceck w/ me in clinic in 3d (leaving on vacation in 4d) to ensure cbgs tolerating prednisone. Reviewed eye care - artificial tears during day and ointment at night - keep eye covered w/ patch/mask while asleep but no tape to eye.  Type II or unspecified type diabetes mellitus without mention of complication, uncontrolled - Increase metfomrin from 10034mXR to 200071mR qhs.  Pt was started on glipizide  yesterday but she would rather try Invokana - her mother has done VERY well on this and she likes the possibility for weight loss (rather than gain w/ glipizide). Start Invokana - reviewed potential side effects of increased freq of UTIs/yeast infections as well as others as new on market - pt would like proceed anyway  Acute sinusitis, recurrence not specified, unspecified location - recommend finishing doxycycline for this which was started yesterday - prednisone will help as well.  Meds ordered this encounter  Medications  . valACYclovir (VALTREX) 1000 MG tablet    Sig: Take 1 tablet (1,000 mg total) by mouth 3 (three) times daily.    Dispense:  21 tablet    Refill:  0  . DISCONTD: predniSONE (DELTASONE) 20 MG tablet    Sig: 3 tabs po qd x 5 days, then 2 tabs po qd x 2d, 1 tab po qd x 2d, 1/2 tab po qd x 2d, stop    Dispense:  22 tablet    Refill:  0  .  metFORMIN (GLUMETZA) 1000 MG (MOD) 24 hr tablet    Sig: Take 2 tablets (2,000 mg total) by mouth daily with breakfast.    Dispense:  180 tablet    Refill:  1  . Canagliflozin 100 MG TABS    Sig: Take 1 tablet (100 mg total) by mouth daily before breakfast.    Dispense:  90 tablet    Refill:  1    Delman Cheadle, MD MPH

## 2014-09-15 ENCOUNTER — Ambulatory Visit: Payer: 59 | Admitting: Family Medicine

## 2014-09-22 ENCOUNTER — Ambulatory Visit: Payer: 59 | Admitting: Dietician

## 2014-09-22 ENCOUNTER — Encounter: Payer: 59 | Attending: Family Medicine | Admitting: *Deleted

## 2014-09-22 ENCOUNTER — Encounter: Payer: Self-pay | Admitting: *Deleted

## 2014-09-22 DIAGNOSIS — E119 Type 2 diabetes mellitus without complications: Secondary | ICD-10-CM | POA: Diagnosis present

## 2014-09-22 DIAGNOSIS — Z713 Dietary counseling and surveillance: Secondary | ICD-10-CM | POA: Insufficient documentation

## 2014-09-22 NOTE — Progress Notes (Signed)
Diabetes Self-Management Education  Visit Type:    Appt. Start Time: 1400 Appt. End Time: 1530  09/22/2014  Ms. Julia [name], identified by name and date of birth, is a 43 y.o. female with a diagnosis of Diabetes: Type 2.  Other people present during visit:  Patient   ASSESSMENT  There were no vitals taken for this visit. There is no weight on file to calculate BMI.  Initial Visit Information:  Are you currently following a meal plan?: No   Are you taking your medications as prescribed?: Yes Are you checking your feet?: Yes How many days per week are you checking your feet?: 4 How often do you need to have someone help you when you read instructions, pamphlets, or other written materials from your doctor or pharmacy?: 1 - Never What is the last grade level you completed in school?: 12  Psychosocial:     Patient Belief/Attitude about Diabetes: Motivated to manage diabetes Self-care barriers: None Self-management support: Family Other persons present: Patient Patient Concerns: Nutrition/Meal planning Special Needs: None Preferred Learning Style: Visual  Complications:   Last HgB A1C per patient/outside source: 11.7 mg/dL How often do you check your blood sugar?: 0 times/day (not testing) Fasting Blood glucose range (mg/dL): [phone] Number of hypoglycemic episodes per month: 0 Number of hyperglycemic episodes per week: 7 Have you had a dilated eye exam in the past 12 months?: Yes Have you had a dental exam in the past 12 months?: Yes  Diet Intake:  Breakfast: Malawiturkey baconand scrambled egg and peanut butter; mcdoanld's sometimes Snack (morning): might not have anything or might have pretzels with hummus or Nabs Lunch: sandwich or salad or burger and fries Snack (afternoon): might not have anything or might have pretzels with hummus or Nabs Dinner: chicken or red meat sometimes with vegetables; sometimes pasta dish Snack (evening): peanut butter crackers or Fiber One  brownie Beverage(s): diet sprite or water.  cut back from diet mt dew  Exercise:  Exercise: ADL's  Individualized Plan for Diabetes Self-Management Training:   Learning Objective:  Patient will have a greater understanding of diabetes self-management.  Patient education plan per assessed needs and concerns is to attend individual sessions for     Education Topics Reviewed with Patient Today:  Definition of diabetes, type 1 and 2, and the diagnosis of diabetes;Factors that contribute to the development of diabetes;Explored patient's options for treatment of their diabetes Role of diet in the treatment of diabetes and the relationship between the three main macronutrients and blood glucose level   Reviewed patients medication for diabetes, action, purpose, timing of dose and side effects. Purpose and frequency of SMBG.;Identified appropriate SMBG and/or A1C goals. Taught treatment of hypoglycemia - the 15 rule. Relationship between chronic complications and blood glucose control Worked with patient to identify barriers to care and solutions      PATIENTS GOALS/Plan (Developed by the patient):  Nutrition: Follow meal plan discussed Monitoring : test blood glucose pre and post meals as discussed  Plan:   Patient Instructions  Goals:  Follow Diabetes Meal Plan as instructed  Eat 3 meals and 2 snacks, every 3-5 hrs  Limit carbohydrate intake to 45 grams carbohydrate/meal  Limit carbohydrate intake to 15 grams carbohydrate/snack  Add lean protein foods to meals/snacks  Monitor glucose levels as instructed by your doctor  Aim for 30 mins of physical activity daily  Bring food record and glucose log to your next nutrition visit     Expected Outcomes:  Demonstrated interest  in learning. Expect positive outcomes  Education material provided: Living Well with Diabetes, Meal plan card, Snack sheet and Support group flyer  If problems or questions, patient to contact team  via:  Phone  Future DSME appointment: 3 months

## 2014-09-22 NOTE — Patient Instructions (Signed)
Goals:  Follow Diabetes Meal Plan as instructed  Eat 3 meals and 2 snacks, every 3-5 hrs  Limit carbohydrate intake to 45 grams carbohydrate/meal  Limit carbohydrate intake to 15 grams carbohydrate/snack  Add lean protein foods to meals/snacks  Monitor glucose levels as instructed by your doctor  Aim for 30 mins of physical activity daily  Bring food record and glucose log to your next nutrition visit  

## 2014-10-02 ENCOUNTER — Ambulatory Visit: Payer: 59 | Admitting: Family Medicine

## 2014-10-11 ENCOUNTER — Other Ambulatory Visit: Payer: Self-pay | Admitting: Family Medicine

## 2014-10-15 ENCOUNTER — Other Ambulatory Visit: Payer: Self-pay | Admitting: Family Medicine

## 2014-10-20 ENCOUNTER — Ambulatory Visit (INDEPENDENT_AMBULATORY_CARE_PROVIDER_SITE_OTHER): Payer: 59 | Admitting: Cardiovascular Disease

## 2014-10-20 ENCOUNTER — Other Ambulatory Visit: Payer: Self-pay | Admitting: Family Medicine

## 2014-10-20 ENCOUNTER — Encounter: Payer: Self-pay | Admitting: Cardiovascular Disease

## 2014-10-20 VITALS — BP 122/84 | HR 90 | Ht 64.0 in | Wt 180.8 lb

## 2014-10-20 DIAGNOSIS — I471 Supraventricular tachycardia: Secondary | ICD-10-CM

## 2014-10-20 MED ORDER — METOPROLOL TARTRATE 50 MG PO TABS: 50.0000 mg | ORAL_TABLET | Freq: Two times a day (BID) | ORAL | Status: DC

## 2014-10-20 NOTE — Assessment & Plan Note (Addendum)
Julia [name]  seems to be doing well from an SVT standpoint. She's had any further episodes of SVT.  I do  Think she  is having some premature ventricular contractions. They seem to be worse around the time of her periods.  Will increase her metoprolol to 50 BID Office visit in 6 months.

## 2014-10-20 NOTE — Progress Notes (Signed)
Julia [name] Date of Birth  1971/02/24       [facility] Office [address], [address], [place], [place]  [address]   [place],   [address] [place]   Fax  [phone]     Fax [phone]  Problem List: 1. Supraventricular tachycardia 2. Hypertension  3. Diabetes mellitus  History of Present Illness:  Julia [name] is seen today for follow up of SVT. See her in the past for evaluation of some chest discomfort. She normal stress echocardiogram in 2010. She had normal left ventricular systolic function at that time.  She is here to follow up for an episode of SVT.   It started suddenly while at work.  She had been drinking lots of diet mountain dew.  ( has since cut back). She had stopped her metformin but ha restarted.    She has had lots of palpitations  Nov. 17, 2015:  [name] has continued to have palpitations .  Takes an extra metoprolol on occasion.    Seems to be related to her periods. Glucoses are better.   Still working on diet.     Current Outpatient Prescriptions on File Prior to Visit  Medication Sig Dispense Refill  . Blood Glucose Monitoring Suppl (ONE TOUCH ULTRA SYSTEM KIT) W/DEVICE KIT 1 kit by Does not apply route once. 1 each 0  . Canagliflozin 100 MG TABS Take 1 tablet (100 mg total) by mouth daily before breakfast. 90 tablet 1  . fluticasone (FLONASE) 50 MCG/ACT nasal spray Place 2 sprays into both nostrils daily. 16 g 6  . glucose blood test strip Use as instructed 100 each 12  . Lancets (ONETOUCH ULTRASOFT) lancets Use as instructed 100 each 12  . metFORMIN (GLUMETZA) 1000 MG (MOD) 24 hr tablet Take 2 tablets (2,000 mg total) by mouth daily with breakfast. 180 tablet 1  . metoprolol tartrate (LOPRESSOR) 25 MG tablet Take 1 tablet (25 mg total) by mouth 2 (two) times daily. 60 tablet 3  . doxycycline (VIBRAMYCIN) 100 MG capsule Take 1 capsule (100 mg total) by mouth 2 (two) times daily.  20 capsule 0  . fluconazole (DIFLUCAN) 150 MG tablet Take 1 tablet (150 mg total) by mouth once. Repeat if needed 2 tablet 0  . glipiZIDE (GLUCOTROL) 5 MG tablet TAKE 1 TABLET (5 MG TOTAL) BY MOUTH 2 (TWO) TIMES DAILY BEFORE A MEAL. 60 tablet 3  . methylPREDNISolone (MEDROL, PAK,) 4 MG tablet follow package directions 21 tablet 0  . promethazine (PHENERGAN) 25 MG tablet Take 1 tablet (25 mg total) by mouth every 8 (eight) hours as needed for nausea or vomiting. 20 tablet 0  . valACYclovir (VALTREX) 1000 MG tablet Take 1 tablet (1,000 mg total) by mouth 3 (three) times daily. 21 tablet 0   No current facility-administered medications on file prior to visit.    No Known Allergies  Past Medical History  Diagnosis Date  . Diabetes mellitus   . HTN (hypertension)   . Hyperlipidemia     Past Surgical History  Procedure Laterality Date  . Cesarean section      History  Smoking status  . Former Smoker  . Types: Cigarettes  . Quit date: 12/16/2002  Smokeless tobacco  . Not on file    History  Alcohol Use  . 1.0 oz/week  . 2 drink(s) per week    Family History  Problem Relation  Age of Onset  . Hypertension Mother   . Thyroid disease Mother   . Diabetes Mother   . Thyroid disease Sister   . Diabetes Maternal Grandmother   . Hyperlipidemia Other   . Thyroid disease Other     Reviw of Systems:  Reviewed in the HPI.  All other systems are negative.  Physical Exam: Blood pressure 122/84, pulse 90, height 5' 4" (1.626 m), weight 180 lb 12.8 oz (82.01 kg). Wt Readings from Last 3 Encounters:  10/20/14 180 lb 12.8 oz (82.01 kg)  08/21/14 172 lb 6.4 oz (78.2 kg)  08/18/14 177 lb (80.287 kg)     General: Well developed, well nourished, in no acute distress.  Head: Normocephalic, atraumatic, sclera non-icteric, mucus membranes are moist,   Neck: Supple. Carotids are 2 + without bruits. No JVD   Lungs: Clear   Heart: RR, normla s1S2  Abdomen: Soft, non-tender,  non-distended with normal bowel sounds.  Msk:  Strength and tone are normal   Extremities: No clubbing or cyanosis. No edema.  Distal pedal pulses are 2+ and equal    Neuro: CN II - XII intact.  Alert and oriented X 3.   Psych:  Normal   ECG: In the ER .  Narrow complex tachycardia ( SVT) at a rate of 220 ECG #2 - sinus tach at 115.   Assessment / Plan:

## 2014-10-20 NOTE — Patient Instructions (Addendum)
He should try using potassium chloride which is a salt substitute instead sodium chloride.  Your physician has recommended you make the following change in your medication:  INCREASE Metoprolol to 50 mg twice daily  Your physician wants you to follow-up in: 6 months with Dr. Elease HashimotoNahser.  You will receive a reminder letter in the mail two months in advance. If you don't receive a letter, please call our office to schedule the follow-up appointment.

## 2014-10-23 ENCOUNTER — Ambulatory Visit: Payer: 59 | Admitting: Family Medicine

## 2014-11-08 ENCOUNTER — Emergency Department (HOSPITAL_COMMUNITY)
Admission: EM | Admit: 2014-11-08 | Discharge: 2014-11-08 | Disposition: A | Discharge status: Home / Self Care | Payer: 59 | Attending: Emergency Medicine | Admitting: Emergency Medicine

## 2014-11-08 ENCOUNTER — Other Ambulatory Visit: Payer: Self-pay

## 2014-11-08 ENCOUNTER — Encounter (HOSPITAL_COMMUNITY): Payer: Self-pay

## 2014-11-08 DIAGNOSIS — E119 Type 2 diabetes mellitus without complications: Secondary | ICD-10-CM | POA: Diagnosis not present

## 2014-11-08 DIAGNOSIS — I471 Supraventricular tachycardia: Secondary | ICD-10-CM | POA: Diagnosis not present

## 2014-11-08 DIAGNOSIS — Z79899 Other long term (current) drug therapy: Secondary | ICD-10-CM | POA: Insufficient documentation

## 2014-11-08 DIAGNOSIS — Z7951 Long term (current) use of inhaled steroids: Secondary | ICD-10-CM | POA: Diagnosis not present

## 2014-11-08 DIAGNOSIS — Z87891 Personal history of nicotine dependence: Secondary | ICD-10-CM | POA: Diagnosis not present

## 2014-11-08 DIAGNOSIS — I10 Essential (primary) hypertension: Secondary | ICD-10-CM | POA: Insufficient documentation

## 2014-11-08 DIAGNOSIS — R002 Palpitations: Secondary | ICD-10-CM

## 2014-11-08 DIAGNOSIS — R000 Tachycardia, unspecified: Secondary | ICD-10-CM | POA: Diagnosis present

## 2014-11-08 NOTE — Discharge Instructions (Signed)
Supraventricular Tachycardia °Supraventricular tachycardia (SVT) is an abnormal heart rhythm (arrhythmia) that causes the heart to beat very fast (tachycardia). This kind of fast heartbeat originates in the upper chambers of the heart (atria). SVT can cause the heart to beat greater than 100 beats per minute. SVT can have a rapid burst of heartbeats. This can start and stop suddenly without warning and is called nonsustained. SVT can also be sustained, in which the heart beats at a continuous fast rate.  °CAUSES  °There can be different causes of SVT. Some of these include: °· Heart valve problems such as mitral valve prolapse. °· An enlarged heart (hypertrophic cardiomyopathy). °· Congenital heart problems. °· Heart inflammation (pericarditis). °· Hyperthyroidism. °· Low potassium or magnesium levels. °· Caffeine. °· Drug use such as cocaine, methamphetamines, or stimulants. °· Some over-the-counter medicines such as: °¨ Decongestants. °¨ Diet medicines. °¨ Herbal medicines. °SYMPTOMS  °Symptoms of SVT can vary. Symptoms depend on whether the SVT is sustained or nonsustained. You may experience: °· No symptoms (asymptomatic). °· An awareness of your heart beating rapidly (palpitations). °· Shortness of breath. °· Chest pain or pressure. °If your blood pressure drops because of the SVT, you may experience: °· Fainting or near fainting. °· Weakness. °· Dizziness. °DIAGNOSIS  °Different tests can be performed to diagnose SVT, such as: °· An electrocardiogram (EKG). This is a painless test that records the electrical activity of your heart. °· Holter monitor. This is a 24 hour recording of your heart rhythm. You will be given a diary. Write down all symptoms that you have and what you were doing at the time you experienced symptoms. °· Arrhythmia monitor. This is a small device that your wear for several weeks. It records the heart rhythm when you have symptoms. °· Echocardiogram. This is an imaging test to help detect  abnormal heart structure such as congenital abnormalities, heart valve problems, or heart enlargement. °· Stress test. This test can help determine if the SVT is related to exercise. °· Electrophysiology study (EPS). This is a procedure that evaluates your heart's electrical system and can help your caregiver find the cause of your SVT. °TREATMENT  °Treatment of SVT depends on the symptoms, how often it recurs, and whether there are any underlying heart problems.  °· If symptoms are rare and no other cardiac disease is present, no treatment may be needed. °· Blood work may be done to check potassium, magnesium, and thyroid hormone levels to see if they are abnormal. If these levels are abnormal, treatment to correct the problems will occur. °Medicines °Your caregiver may use oral medicines to treat SVT. These medicines are given for long-term control of SVT. Medicines may be used alone or in combination with other treatments. These medicines work to slow nerve impulses in the heart muscle. These medicines can also be used to treat high blood pressure. Some of these medicines may include: °· Calcium channel blockers. °· Beta blockers. °· Digoxin. °Nonsurgical procedures °Nonsurgical techniques may be used if oral medicines do not work. Some examples include: °· Cardioversion. This technique uses either drugs or an electrical shock to restore a normal heart rhythm. °¨ Cardioversion drugs may be given through an intravenous (IV) line to help "reset" the heart rhythm. °¨ In electrical cardioversion, the caregiver shocks your heart to stop its beat for a split second. This helps to reset the heart to a normal rhythm. °· Ablation. This procedure is done under mild sedation. High frequency radio wave energy is used to   destroy the area of heart tissue responsible for the SVT. °HOME CARE INSTRUCTIONS  °· Do not smoke. °· Only take medicines prescribed by your caregiver. Check with your caregiver before using over-the-counter  medicines. °· Check with your caregiver about how much alcohol and caffeine (coffee, tea, colas, or chocolate) you may have. °· It is very important to keep all follow-up referrals and appointments in order to properly manage this problem. °SEEK IMMEDIATE MEDICAL CARE IF: °· You have dizziness. °· You faint or nearly faint. °· You have shortness of breath. °· You have chest pain or pressure. °· You have sudden nausea or vomiting. °· You have profuse sweating. °· You are concerned about how long your symptoms last. °· You are concerned about the frequency of your SVT episodes. °If you have the above symptoms, call your local emergency services (911 in [place] immediately. Do not drive yourself to the hospital. °MAKE SURE YOU:  °· Understand these instructions. °· Will watch your condition. °· Will get help right away if you are not doing well or get worse. °Document Released: 11/20/2005 Document Revised: 02/12/2012 Document Reviewed: 03/04/2009 °[employer]® Patient Information ©2015 [employer]. This information is not intended to replace advice given to you by your health care provider. Make sure you discuss any questions you have with your health care provider. ° °

## 2014-11-08 NOTE — ED Notes (Signed)
Per EMS: pt from home with SVT.  Pt was using the restroom when she felt her heart racing.  HR initially 198 with EMS; pt given 6 of Adenosine and converted to NSR.  Pt with hx of SVT with last episode in August.

## 2014-11-08 NOTE — ED Provider Notes (Signed)
CSN: [id number]     Arrival date & time 11/08/14  0028 History   First MD Initiated Contact with Patient 11/08/14 0044     Chief Complaint  Patient presents with  . Tachycardia     (Consider location/radiation/quality/duration/timing/severity/associated sxs/prior Treatment) Patient is a 43 y.o. female presenting with palpitations.  Palpitations Palpitations quality:  Regular Onset quality:  Sudden Duration:  1 hour Timing:  Constant Progression:  Resolved Chronicity:  Recurrent Context comment:  Known SVT Relieved by:  Nothing Worsened by:  Nothing tried Ineffective treatments:  None tried Associated symptoms: no back pain, no chest pain, no chest pressure, no diaphoresis, no nausea, no shortness of breath and no vomiting     Past Medical History  Diagnosis Date  . Diabetes mellitus   . HTN (hypertension)   . Hyperlipidemia   . SVT (supraventricular tachycardia)    Past Surgical History  Procedure Laterality Date  . Cesarean section     Family History  Problem Relation Age of Onset  . Hypertension Mother   . Thyroid disease Mother   . Diabetes Mother   . Thyroid disease Sister   . Diabetes Maternal Grandmother   . Hyperlipidemia Other   . Thyroid disease Other    History  Substance Use Topics  . Smoking status: Former Smoker    Types: Cigarettes    Quit date: 12/16/2002  . Smokeless tobacco: Not on file  . Alcohol Use: 1.0 oz/week    2 Not specified per week   OB History    No data available     Review of Systems  Constitutional: Negative for diaphoresis.  Respiratory: Negative for shortness of breath.   Cardiovascular: Positive for palpitations. Negative for chest pain.  Gastrointestinal: Negative for nausea and vomiting.  Musculoskeletal: Negative for back pain.  All other systems reviewed and are negative.     Allergies  Review of patient's allergies indicates no known allergies.  Home Medications   Prior to Admission medications    Medication Sig Start Date End Date Taking? Authorizing Provider  Blood Glucose Monitoring Suppl (ONE TOUCH ULTRA SYSTEM KIT) W/DEVICE KIT 1 kit by Does not apply route once. 09/30/13   Mancel Bale, PA-C  Canagliflozin 100 MG TABS Take 1 tablet (100 mg total) by mouth daily before breakfast. 08/18/14   Shawnee Knapp, MD  fluticasone [facility]) 50 MCG/ACT nasal spray Place 2 sprays into both nostrils daily. 08/17/14   Elby Beck, FNP  glucose blood test strip Use as instructed 09/30/13   Mancel Bale, PA-C  Lancets [facility] ULTRASOFT) lancets Use as instructed 09/30/13   Mancel Bale, PA-C  metFORMIN (GLUMETZA) 1000 MG (MOD) 24 hr tablet Take 2 tablets (2,000 mg total) by mouth daily with breakfast. 08/18/14   Shawnee Knapp, MD  metoprolol tartrate (LOPRESSOR) 50 MG tablet Take 1 tablet (50 mg total) by mouth 2 (two) times daily. 10/20/14   Thayer Headings, MD   BP 146/102 mmHg  Pulse 100  Temp(Src) 99.5 F (37.5 C) (Oral)  Resp 18  SpO2 96%  LMP 11/06/2014 Physical Exam  Constitutional: She is oriented to person, place, and time. She appears well-developed and well-nourished.  HENT:  Head: Normocephalic and atraumatic.  Right Ear: External ear normal.  Left Ear: External ear normal.  Eyes: Conjunctivae and EOM are normal. Pupils are equal, round, and reactive to light.  Neck: Normal range of motion. Neck supple.  Cardiovascular: Normal rate, regular rhythm, normal heart sounds and intact  distal pulses.   Pulmonary/Chest: Effort normal and breath sounds normal.  Abdominal: Soft. Bowel sounds are normal. There is no tenderness.  Musculoskeletal: Normal range of motion.  Neurological: She is alert and oriented to person, place, and time.  Skin: Skin is warm and dry.  Vitals reviewed.   ED Course  Procedures (including critical care time) Labs Review Labs Reviewed - No data to display  Imaging Review No results found.   EKG Interpretation None      MDM   Final  diagnoses:  None    43 y.o. female with pertinent PMH of SVT presents with recurrent SVT. The patient states that she was at home using the restroom she began to feel her heart racing. She attempted vagal maneuvers with cold water on her face and walking outside in the cold. His did not improve her symptoms. On EMS arrival the patient heart rate of 198 with a narrow complex tachycardia. 6 mg of adenosine resolved rhythm and the patient returned to normal sinus rhythm. On arrival the patient is a symptomatically. She denies any antecedent chest pain, shortness of breath. She states that she is symptom free at this moment with exception of mild fatigue. Repeat EKG here normal sinus rhythm no ST changes or concerning findings for ischemia. Discussed standard return precautions and vagal maneuvers with patient. She will follow-up with her cardiologist..    1. SVT (supraventricular tachycardia)   2. Palpitations         Debby Freiberg, MD 11/08/14 [phone]

## 2014-11-10 ENCOUNTER — Telehealth: Payer: Self-pay | Admitting: Cardiovascular Disease

## 2014-11-10 MED ORDER — METOPROLOL TARTRATE 100 MG PO TABS: 100.0000 mg | ORAL_TABLET | Freq: Two times a day (BID) | ORAL | Status: DC

## 2014-11-10 NOTE — Telephone Encounter (Signed)
New Message        Pt calling stating she had another SVT and needs a call back in regards to starting a new med. Please call back and advise.

## 2014-11-10 NOTE — Telephone Encounter (Signed)
Spoke with patient and advised her of Dr. Harvie BridgeNahser's advice to increase Metoprolol to 100 mg twice daily and that an appointment has been made for her to see Dr. Ladona Ridgelaylor on 12/30.  I advised patient to call back with worsening symptoms or concerns.  Patient verbalized understanding and agreement and thanked me for the call

## 2014-11-10 NOTE — Telephone Encounter (Signed)
Chart reviewed. SVT , converted by EMS Increase metoprolol to 100 BID Refer to EF

## 2014-11-10 NOTE — Telephone Encounter (Signed)
Spoke with patient who states she went into SVT on Saturday at rate of 198 and had to call EMS.  Patient states she converted to NSR after Adenosine 6 mg.  She states her heart rate has been in the 90's since that time and she is concerned about going back into SVT.  Patient would like to know what Dr. Elease HashimotoNahser advises.  Patient is currently taking Metoprolol 50 mg BID.  I advised that I will send message to Dr. Elease HashimotoNahser for advice and will call her back.  Patient verbalized understanding and agreement.

## 2014-11-10 NOTE — Telephone Encounter (Signed)
Left message for patient to call me back for Dr. Harvie BridgeNahser's advice

## 2014-11-13 ENCOUNTER — Ambulatory Visit: Payer: 59 | Admitting: Cardiovascular Disease

## 2014-11-20 ENCOUNTER — Ambulatory Visit: Payer: 59 | Admitting: Cardiovascular Disease

## 2014-11-20 ENCOUNTER — Ambulatory Visit (INDEPENDENT_AMBULATORY_CARE_PROVIDER_SITE_OTHER): Payer: 59 | Admitting: Family Medicine

## 2014-11-20 VITALS — BP 118/86 | HR 78 | Temp 98.3°F | Resp 16 | Ht 63.5 in | Wt 173.0 lb

## 2014-11-20 DIAGNOSIS — F4323 Adjustment disorder with mixed anxiety and depressed mood: Secondary | ICD-10-CM

## 2014-11-20 DIAGNOSIS — Z23 Encounter for immunization: Secondary | ICD-10-CM

## 2014-11-20 MED ORDER — BUPROPION HCL ER (XL) 150 MG PO TB24: 150.0000 mg | ORAL_TABLET | Freq: Every day | ORAL | Status: DC

## 2014-11-20 MED ORDER — LAMOTRIGINE 100 MG PO TABS: 100.0000 mg | ORAL_TABLET | Freq: Every day | ORAL | Status: DC

## 2014-11-20 NOTE — Progress Notes (Signed)
   Subjective:    Patient ID: Julia [name], female    DOB: 1971-10-09, 43 y.o.   MRN: [id number] This chart was scribed for Elvina SidleKurt Lekeith Wulf, MD by Littie Deedsichard Sun, Medical Scribe. This patient was seen in Room 13 and the patient's care was started at 1:53 PM.   HPI HPI Comments: Julia [name] is a 43 y.o. female who presents to the [facility] complaining of for a flu shot and request for a anti-depressant. Patient has gone through a lot of medical issues and personal relationship issues. Her second husband recently left her 3 weeks ago after 5 years of marriage. Patient notes decreased energy levels, crying often and fluctuating appetite (but overall decrease). She also notes an intermittent burning arm pain to both arms. Patient has been going to therapy. She has taken Celexa in the past, but she disliked it because it made her eat more.   Patient works as a Airline pilotservice operations manager. She has a 45-year-old son with her first husband; the father has custody over her son on weekends. Her [place] is [employer].  Review of Systems  Constitutional: Positive for appetite change and fatigue.  Musculoskeletal: Positive for myalgias.  Psychiatric/Behavioral: Positive for dysphoric mood. Negative for suicidal ideas.       Objective:   Physical Exam CONSTITUTIONAL: Well developed/well nourished HEAD: Normocephalic/atraumatic EYES: EOM/PERRL ENMT: Mucous membranes moist NECK: supple no meningeal signs SPINE: entire spine nontender CV: S1/S2 noted, no murmurs/rubs/gallops noted LUNGS: Lungs are clear to auscultation bilaterally, no apparent distress ABDOMEN: soft, nontender, no rebound or guarding GU: no cva tenderness NEURO: Pt is awake/alert, moves all extremitiesx4 EXTREMITIES: pulses normal, full ROM SKIN: warm, color normal PSYCH: no abnormalities of mood noted        Assessment & Plan:   This chart was scribed in my presence and reviewed by me  personally.    ICD-9-CM ICD-10-CM   1. Adjustment disorder with mixed anxiety and depressed mood 309.28 F43.23 lamoTRIgine (LAMICTAL) 100 MG tablet     buPROPion (WELLBUTRIN XL) 150 MG 24 hr tablet     Signed, Elvina SidleKurt Rathana Viveros, MD

## 2014-11-20 NOTE — Patient Instructions (Addendum)
Adjustment Disorder Most changes in life can cause stress. Getting used to changes may take a few months or longer. If feelings of stress, hopelessness, or worry continue, you may have an adjustment disorder. This stress-related mental health problem may affect your feelings, thinking and how you act. It occurs in both sexes and happens at any age. SYMPTOMS  Some of the following problems may be seen and vary from person to person:  Sadness or depression.  Loss of enjoyment.  Thoughts of suicide.  Fighting.  Avoiding family and friends.  Poor school performance.  Hopelessness, sense of loss.  Trouble sleeping.  Vandalism.  Worry, weight loss or gain.  Crying spells.  Anxiety  Reckless driving.  Skipping school.  Poor work International aid/development workerperformance.  Nervousness.  Ignoring bills.  Poor attitude. DIAGNOSIS  Your caregiver will ask what has happened in your life and do a physical exam. They will make a diagnosis of an adjustment disorder when they are sure another problem or medical illness causing your feelings does not exist. TREATMENT  When problems caused by stress interfere with you daily life or last longer than a few months, you may need counseling for an adjustment disorder. Early treatment may diminish problems and help you to better cope with the stressful events in your life. Sometimes medication is necessary. Individual counseling and or support groups can be very helpful. PROGNOSIS  Adjustment disorders usually last less than 3 to 6 months. The condition may persist if there is long lasting stress. This could include health problems, relationship problems, or job difficulties where you can not easily escape from what is causing the problem. PREVENTION  Even the most mentally healthy, highly functioning people can suffer from an adjustment disorder given a significant blow from a life-changing event. There is no way to prevent pain and loss. Most people need help from time  to time. You are not alone. SEEK MEDICAL CARE IF:  Your feelings or symptoms listed above do not improve or worsen. Document Released: 07/25/2006 Document Revised: 02/12/2012 Document Reviewed: 10/16/2007 [facility] Patient Information 2015 [place]. This information is not intended to replace advice given to you by your health care provider. Make sure you discuss any questions you have with your health care provider. Influenza Vaccine (Flu Vaccine, Inactivated or Recombinant) 2014-2015: What You Need to Know 1. Why get vaccinated? Influenza ("flu") is a contagious disease that spreads around the [place] every winter, usually between October and May. Flu is caused by influenza viruses, and is spread mainly by coughing, sneezing, and close contact. Anyone can get flu, but the risk of getting flu is highest among children. Symptoms come on suddenly and may last several days. They can include:  fever/chills  sore throat  muscle aches  fatigue  cough  headache  runny or stuffy nose Flu can make some people much sicker than others. These people include young children, people 5765 and older, pregnant women, and people with certain health conditions-such as heart, lung or kidney disease, nervous system disorders, or a weakened immune system. Flu vaccination is especially important for these people, and anyone in close contact with them. Flu can also lead to pneumonia, and make existing medical conditions worse. It can cause diarrhea and seizures in children. Each year thousands of people in the [place] die from flu, and many more are hospitalized. Flu vaccine is the best protection against flu and its complications. Flu vaccine also helps prevent spreading flu from person to person. 2. Inactivated and  recombinant flu vaccines You are getting an injectable flu vaccine, which is either an "inactivated" or "recombinant" vaccine. These vaccines do not contain any live influenza  virus. They are given by injection with a needle, and often called the "flu shot."  A different live, attenuated (weakened) influenza vaccine is sprayed into the nostrils. This vaccine is described in a separate Vaccine Information Statement. Flu vaccination is recommended every year. Some children 6 months through 408 years of age might need two doses during one year. Flu viruses are always changing. Each year's flu vaccine is made to protect against 3 or 4 viruses that are likely to cause disease that year. Flu vaccine cannot prevent all cases of flu, but it is the best defense against the disease.  It takes about 2 weeks for protection to develop after the vaccination, and protection lasts several months to a year. Some illnesses that are not caused by influenza virus are often mistaken for flu. Flu vaccine will not prevent these illnesses. It can only prevent influenza. Some inactivated flu vaccine contains a very small amount of a mercury-based preservative called thimerosal. Studies have shown that thimerosal in vaccines is not harmful, but flu vaccines that do not contain a preservative are available. 3. Some people should not get this vaccine Tell the person who gives you the vaccine:  If you have any severe, life-threatening allergies. If you ever had a life-threatening allergic reaction after a dose of flu vaccine, or have a severe allergy to any part of this vaccine, including (for example) an allergy to gelatin, antibiotics, or eggs, you may be advised not to get vaccinated. Most, but not all, types of flu vaccine contain a small amount of egg protein.  If you ever had Guillain-Barr Syndrome (a severe paralyzing illness, also called GBS). Some people with a history of GBS should not get this vaccine. This should be discussed with your doctor.  If you are not feeling well. It is usually okay to get flu vaccine when you have a mild illness, but you might be advised to wait until you feel  better. You should come back when you are better. 4. Risks of a vaccine reaction With a vaccine, like any medicine, there is a chance of side effects. These are usually mild and go away on their own. Problems that could happen after any vaccine:  Brief fainting spells can happen after any medical procedure, including vaccination. Sitting or lying down for about 15 minutes can help prevent fainting, and injuries caused by a fall. Tell your doctor if you feel dizzy, or have vision changes or ringing in the ears.  Severe shoulder pain and reduced range of motion in the arm where a shot was given can happen, very rarely, after a vaccination.  Severe allergic reactions from a vaccine are very rare, estimated at less than 1 in a million doses. If one were to occur, it would usually be within a few minutes to a few hours after the vaccination. Mild problems following inactivated flu vaccine:  soreness, redness, or swelling where the shot was given  hoarseness  sore, red or itchy eyes  cough  fever  aches  headache  itching  fatigue If these problems occur, they usually begin soon after the shot and last 1 or 2 days. Moderate problems following inactivated flu vaccine:  Young children who get inactivated flu vaccine and pneumococcal vaccine (PCV13) at the same time may be at increased risk for seizures caused by fever. Ask  your doctor for more information. Tell your doctor if a child who is getting flu vaccine has ever had a seizure. Inactivated flu vaccine does not contain live flu virus, so you cannot get the flu from this vaccine. As with any medicine, there is a very remote chance of a vaccine causing a serious injury or death. The safety of vaccines is always being monitored. For more information, visit: http://floyd.org/ 5. What if there is a serious reaction? What should I look for?  Look for anything that concerns you, such as signs of a severe allergic reaction, very  high fever, or behavior changes. Signs of a severe allergic reaction can include hives, swelling of the face and throat, difficulty breathing, a fast heartbeat, dizziness, and weakness. These would start a few minutes to a few hours after the vaccination. What should I do?  If you think it is a severe allergic reaction or other emergency that can't wait, call 9-1-1 and get the person to the nearest hospital. Otherwise, call your doctor.  Afterward, the reaction should be reported to the Vaccine Adverse Event Reporting System (VAERS). Your doctor should file this report, or you can do it yourself through the VAERS web site at www.vaers.LAgents.no, or by calling 1-463-158-7348. VAERS does not give medical advice. 6. The National Vaccine Injury Compensation Program The [employer] Vaccine Injury Compensation Program (VICP) is a federal program that was created to compensate people who may have been injured by certain vaccines. Persons who believe they may have been injured by a vaccine can learn about the program and about filing a claim by calling 1-425-760-1827 or visiting the VICP website at SpiritualWord.at. There is a time limit to file a claim for compensation. 7. How can I learn more?  Ask your health care provider.  Call your local or state health department.  Contact the Centers for Disease Control and Prevention (CDC):  Call 9107260608 (1-800-CDC-INFO) or  Visit CDC's website at BiotechRoom.com.cy CDC Vaccine Information Statement (Interim) Inactivated Influenza Vaccine (07/22/2013) Document Released: 09/14/2006 Document Revised: 04/06/2014 Document Reviewed: 11/07/2013 [facility] Patient Information 2015 [place], [place]. This information is not intended to replace advice given to you by your health care provider. Make sure you discuss any questions you have with your health care provider.

## 2014-11-20 NOTE — Progress Notes (Signed)
Is a 43 year old woman who works at [employer] as a Merchandiser, retailsupervisor.  She recently split with her husband of 5 years. She has a 45-year-old son by different marriage. Son seems to be taking this split and stride.  Patient has a long history of mood swings sometimes feeling euphoric and sometimes depressed. She often feels very flat in the morning. Denies suicidal ideation at this point  Patient's in counseling now  I spent 30 minutes discussing the different options for antidepressants and adjustment reaction medication.  I encouraged her to continue the therapy and discuss how she's doing on either Lamictal or the Wellbutrin, her choice, when she sees Dr. Norberto SorensonEva Shaw in a couple weeks.  This chart was scribed in my presence and reviewed by me personally.    ICD-9-CM ICD-10-CM   1. Adjustment disorder with mixed anxiety and depressed mood 309.28 F43.23 lamoTRIgine (LAMICTAL) 100 MG tablet     buPROPion (WELLBUTRIN XL) 150 MG 24 hr tablet     Signed, Elvina SidleKurt Emmamae Mcnamara, MD

## 2014-11-20 NOTE — Addendum Note (Signed)
Addended by: Nita SellsSMITH, Emani Taussig S on: 11/20/2014 02:16 PM   Modules accepted: Orders

## 2014-11-28 ENCOUNTER — Telehealth: Payer: Self-pay

## 2014-11-28 NOTE — Telephone Encounter (Signed)
Pt was prescribed lamictal by dr. l and she is experiencing a sunburnt feeling. Should she stop taking this medication??

## 2014-11-28 NOTE — Telephone Encounter (Signed)
Yes, she should STOP the Lamictal. She should follow-up with Dr. Clelia CroftShaw or Dr. Milus GlazierLauenstein.

## 2014-11-29 NOTE — Telephone Encounter (Signed)
Unable to leave message

## 2014-12-01 NOTE — Telephone Encounter (Signed)
LM to advise pt to stop taking medication and RTC to see Dr. Clelia CroftShaw or Dr. Milus GlazierLauenstein.

## 2014-12-02 ENCOUNTER — Institutional Professional Consult (permissible substitution): Payer: 59 | Admitting: Internal Medicine

## 2014-12-24 ENCOUNTER — Ambulatory Visit: Payer: 59 | Admitting: *Deleted

## 2014-12-31 ENCOUNTER — Encounter: Payer: Self-pay | Admitting: *Deleted

## 2014-12-31 ENCOUNTER — Other Ambulatory Visit: Payer: Self-pay | Admitting: *Deleted

## 2014-12-31 ENCOUNTER — Encounter: Payer: Self-pay | Admitting: Internal Medicine

## 2014-12-31 ENCOUNTER — Ambulatory Visit (INDEPENDENT_AMBULATORY_CARE_PROVIDER_SITE_OTHER): Payer: 59 | Admitting: Internal Medicine

## 2014-12-31 VITALS — BP 118/80 | HR 78 | Ht 64.0 in | Wt 180.8 lb

## 2014-12-31 DIAGNOSIS — I10 Essential (primary) hypertension: Secondary | ICD-10-CM

## 2014-12-31 DIAGNOSIS — I471 Supraventricular tachycardia: Secondary | ICD-10-CM

## 2014-12-31 DIAGNOSIS — Z01812 Encounter for preprocedural laboratory examination: Secondary | ICD-10-CM

## 2014-12-31 DIAGNOSIS — E669 Obesity, unspecified: Secondary | ICD-10-CM

## 2014-12-31 NOTE — Patient Instructions (Addendum)
Your physician has recommended you make the following change in your medication:  1) Decrease Metoprolol to 50 mg  twice daily on 3/3 2) Decrease Metoprolol to 50 mg daily on 3/17 3) Stop Metoprolol on 3/21 (this will be your last dose before procedure)   Your physician has recommended that you have an SVT ablation. Catheter ablation is a medical procedure used to treat some cardiac arrhythmias (irregular heartbeats). During catheter ablation, a long, thin, flexible tube is put into a blood vessel in your groin (upper thigh), or neck. This tube is called an ablation catheter. It is then guided to your heart through the blood vessel. Radio frequency waves destroy small areas of heart tissue where abnormal heartbeats may cause an arrhythmia to start. Please see the instruction sheet given to you today.  Your physician recommends that you return for pre-procedure lab work on 02/17/15

## 2014-12-31 NOTE — Progress Notes (Signed)
HPI Julia [name] is referred today for evaluation of SVT. She is a pleasnat 44 yo woman with a h/o palpitations and documented SVT at rates of over 200 beats a minute. She has had multiple ER visits. She has been treated with increasing doses of metoprolol to try and control her symptoms. She feels fatigue and weak when she goes into SVT and this lasts for over 12 hours when the episodes resolved. She can stop her episodes with IV adenosine. She gets sob but has never had frank syncope with her SVT. She feels terrible on high dose metoprolol.  No Known Allergies   Current Outpatient Prescriptions  Medication Sig Dispense Refill  . Blood Glucose Monitoring Suppl (ONE TOUCH ULTRA SYSTEM KIT) W/DEVICE KIT 1 kit by Does not apply route once. 1 each 0  . glucose blood test strip Use as instructed 100 each 12  . lamoTRIgine (LAMICTAL) 100 MG tablet Take 1 tablet (100 mg total) by mouth daily. 30 tablet 3  . Lancets (ONETOUCH ULTRASOFT) lancets Use as instructed 100 each 12  . metFORMIN (GLUMETZA) 1000 MG (MOD) 24 hr tablet Take 2 tablets (2,000 mg total) by mouth daily with breakfast. 180 tablet 1  . metoprolol (LOPRESSOR) 100 MG tablet Take 1 tablet (100 mg total) by mouth 2 (two) times daily. 180 tablet 3   No current facility-administered medications for this visit.     Past Medical History  Diagnosis Date  . Diabetes mellitus   . HTN (hypertension)   . Hyperlipidemia   . SVT (supraventricular tachycardia)     ROS:   All systems reviewed and negative except as noted in the HPI.   Past Surgical History  Procedure Laterality Date  . Cesarean section       Family History  Problem Relation Age of Onset  . Hypertension Mother   . Thyroid disease Mother   . Diabetes Mother   . Thyroid disease Sister   . Diabetes Maternal Grandmother   . Hyperlipidemia Other   . Thyroid disease Other      History   Social History  . Marital Status: Married    Spouse Name: N/A   Number of Children: N/A  . Years of Education: N/A   Occupational History  . Not on file.   Social History Main Topics  . Smoking status: Former Smoker    Types: Cigarettes    Quit date: 12/16/2002  . Smokeless tobacco: Not on file  . Alcohol Use: 1.0 oz/week    2 Not specified per week  . Drug Use: No  . Sexual Activity: Yes    Birth Control/ Protection: None   Other Topics Concern  . Not on file   Social History Narrative     BP 118/80 mmHg  Pulse 78  Ht 5' 4"  (1.626 m)  Wt 180 lb 12.8 oz (82.01 kg)  BMI 31.02 kg/m2  Physical Exam:  Well appearing middle aged woman, NAD HEENT: Unremarkable Neck:  No JVD, no thyromegally Lymphatics:  No adenopathy Back:  No CVA tenderness Lungs:  Clear HEART:  Regular rate rhythm, no murmurs, no rubs, no clicks Abd:  soft, obese, positive bowel sounds, no organomegally, no rebound, no guarding Ext:  2 plus pulses, no edema, no cyanosis, no clubbing Skin:  No rashes no nodules Neuro:  CN II through XII intact, motor grossly intact  EKG- NSR with no pre-excitation.  DEVICE  Normal device function.  See PaceArt for details.   Assess/Plan:

## 2014-12-31 NOTE — Assessment & Plan Note (Signed)
Her blood pressure has been well controlled on lopressor 100 bid. She may require a different blood pressure lowering medication if she is able to stop meotprolol after ablation.

## 2014-12-31 NOTE — Assessment & Plan Note (Signed)
She needs to lose weight. Hopefully we can work on this after her ablation.

## 2014-12-31 NOTE — Assessment & Plan Note (Signed)
Her SVT is well controlled on high dose metoprolol but she feels terrible. She is strongly considering cathter ablation. I have discussed the risks/benefits/goals/expectations of the procedure with the patient and she wishes to proceed. She will call us to schedule.

## 2015-02-05 ENCOUNTER — Other Ambulatory Visit: Payer: Self-pay | Admitting: Family Medicine

## 2015-02-05 NOTE — Telephone Encounter (Signed)
Dr Clelia CroftShaw can we refill? Pt has not been seen since 08/28/2014 for her diabetes. Please advise.

## 2015-02-07 NOTE — Telephone Encounter (Signed)
Please call pt.  She needs to have an OV for any additional refills.  You may give her a 30d supply of her metformin to give her time to get into 102 or if she wants to sched at 104 you may refill her DM meds until her appt.  No more than 2 mos.  Unsure about the glucotrol since it is not on hedr me list anymore.  Did a physician take her off this?

## 2015-02-08 NOTE — Telephone Encounter (Signed)
LMOM to CB. 

## 2015-02-09 NOTE — Telephone Encounter (Signed)
See notes from Dr Clelia CroftShaw and pt under other Refill enc from 02/05/15. She OKd 1 mos RF. Done.

## 2015-02-09 NOTE — Telephone Encounter (Signed)
Contacted pt who reported that she does not need the glucotrol, she is not taking this any longer. She is not completely out of metformin, but would like a mos RF to give her time to get in. She knows she needs to come see Dr Clelia CroftShaw, but doesn't want to do anything until after the heart procedure she has to have done on March 23 (heart ablation). Sent in 1 mos RF of Metformin.

## 2015-02-09 NOTE — Telephone Encounter (Signed)
Dr Shaw, see report on ptClelia Croft below, [facility]

## 2015-02-17 ENCOUNTER — Other Ambulatory Visit (INDEPENDENT_AMBULATORY_CARE_PROVIDER_SITE_OTHER): Payer: 59 | Admitting: *Deleted

## 2015-02-17 ENCOUNTER — Ambulatory Visit (INDEPENDENT_AMBULATORY_CARE_PROVIDER_SITE_OTHER): Payer: 59 | Admitting: Family Medicine

## 2015-02-17 VITALS — BP 122/82 | HR 87 | Temp 98.3°F | Resp 17 | Ht 64.0 in | Wt 183.4 lb

## 2015-02-17 DIAGNOSIS — H6012 Cellulitis of left external ear: Secondary | ICD-10-CM

## 2015-02-17 DIAGNOSIS — E119 Type 2 diabetes mellitus without complications: Secondary | ICD-10-CM | POA: Diagnosis not present

## 2015-02-17 DIAGNOSIS — Z01812 Encounter for preprocedural laboratory examination: Secondary | ICD-10-CM

## 2015-02-17 DIAGNOSIS — L409 Psoriasis, unspecified: Secondary | ICD-10-CM

## 2015-02-17 DIAGNOSIS — I471 Supraventricular tachycardia: Secondary | ICD-10-CM

## 2015-02-17 MED ORDER — DOXYCYCLINE HYCLATE 100 MG PO CAPS: 100.0000 mg | ORAL_CAPSULE | Freq: Two times a day (BID) | ORAL | Status: DC

## 2015-02-17 MED ORDER — CEFTRIAXONE SODIUM 1 G IJ SOLR
1.0000 g | Freq: Once | INTRAMUSCULAR | Status: AC
  Administered 2015-02-17: 1 g via INTRAMUSCULAR

## 2015-02-17 MED ORDER — FLUCONAZOLE 150 MG PO TABS: 150.0000 mg | ORAL_TABLET | Freq: Once | ORAL | Status: DC

## 2015-02-17 MED ORDER — AMOXICILLIN-POT CLAVULANATE 875-125 MG PO TABS: 1.0000 | ORAL_TABLET | Freq: Two times a day (BID) | ORAL | Status: DC

## 2015-02-17 NOTE — Progress Notes (Signed)
Subjective:    Patient ID: Julia [name], female    DOB: June 23, 1971, 44 y.o.   MRN: [id number]  02/17/2015  Ear Pain; Neck Pain; and Lock jaw   HPI This 44 y.o. female presents for evaluation of L ear pain, neck pain, jaw locking.  This morning, got up early getting ready. By 8:30am in meeting, started having L jaw pain and inability to shut down on L jaw area.  Felt exhausted.  Then, started getting really tender in L jaw area and L external ear area.  After lunch, noticed red swollen L ear.    Scheduled for heart ablation next week; going for blood work this afternoon.    No fever/chills/sweats.  No headache.  No ST.  No rhinorrhea; no nasal congestion.  No coughing.  No new rash.  +History of psoriasis; no new rash.  Has been digging at psoriasis rash around L peri-auricular skin last night; applied Desonide to area.   No recent travel other than traveling to [place] two weeks ago.  Service Librarian, academic.  No international contacts a lot.  No [place] travel. No trauma to ear.    Sugars are running high.  Had a couple in 300s.   Review of Systems  Constitutional: Positive for fatigue. Negative for fever, chills and diaphoresis.  HENT: Positive for ear pain and facial swelling. Negative for congestion, ear discharge, hearing loss, nosebleeds, postnasal drip, rhinorrhea, sinus pressure and sore throat.   Respiratory: Negative for cough and shortness of breath.   Endocrine: Negative for cold intolerance, heat intolerance, polydipsia, polyphagia and polyuria.  Musculoskeletal: Positive for neck pain.  Skin: Positive for rash.  Neurological: Positive for headaches. Negative for dizziness.    Past Medical History  Diagnosis Date  . Diabetes mellitus   . HTN (hypertension)   . Hyperlipidemia   . SVT (supraventricular tachycardia)   . SVT (supraventricular tachycardia) 11/08/2015  . Complication of anesthesia     I had a reaction to a epidural "   Past Surgical History    Procedure Laterality Date  . Cesarean section    . Ablation of dysrhythmic focus  02/24/2015    SVT  . Supraventricular tachycardia ablation N/A 02/24/2015    Procedure: SUPRAVENTRICULAR TACHYCARDIA ABLATION;  Surgeon: Evans Lance, MD;  Location: [facility] CATH LAB;  Service: Cardiovascular;  Laterality: N/A;   Allergies  Allergen Reactions  . Other Itching    Epidural medication given during C section   Current Outpatient Prescriptions  Medication Sig Dispense Refill  . Blood Glucose Monitoring Suppl (ONE TOUCH ULTRA SYSTEM KIT) W/DEVICE KIT 1 kit by Does not apply route once. 1 each 0  . glucose blood test strip Use as instructed 100 each 12  . Lancets (ONETOUCH ULTRASOFT) lancets Use as instructed 100 each 12  . amoxicillin-clavulanate (AUGMENTIN) 875-125 MG per tablet Take 1 tablet by mouth 2 (two) times daily. 20 tablet 0  . doxycycline (VIBRAMYCIN) 100 MG capsule Take 1 capsule (100 mg total) by mouth 2 (two) times daily. 20 capsule 0  . fluconazole (DIFLUCAN) 150 MG tablet Take 1 tablet (150 mg total) by mouth once. Repeat if needed 2 tablet 0  . INVOKANA 100 MG TABS tablet Take 100 mg by mouth daily.   1  . lamoTRIgine (LAMICTAL) 100 MG tablet Take 1 tablet (100 mg total) by mouth daily. (Patient not taking: Reported on 02/17/2015) 30 tablet 3  . metFORMIN (GLUCOPHAGE) 1000 MG tablet Take 1,000 mg by mouth 2 (  two) times daily with a meal.    . metoprolol tartrate (LOPRESSOR) 25 MG tablet Take 1 tablet (25 mg total) by mouth 2 (two) times daily. Take twice a day for 2 weeks then twice every other day for 2 weeks. 60 tablet 0  . nystatin-triamcinolone (MYCOLOG II) cream Apply 1 application topically 2 (two) times daily as needed (for itching).   0   No current facility-administered medications for this visit.       Objective:    BP 122/82 mmHg  Pulse 87  Temp(Src) 98.3 F (36.8 C)  Resp 17  Ht _0  (1.626 m)  Wt 183 lb 6.4 oz (83.19 kg)  BMI 31.47 kg/m2  SpO2 96%  LMP  02/01/2015 Physical Exam  Constitutional: She is oriented to person, place, and time. She appears well-developed and well-nourished. No distress.  HENT:  Head: Normocephalic and atraumatic.  Right Ear: Tympanic membrane, external ear and ear canal normal.  Left Ear: Tympanic membrane and ear canal normal. No lacerations. There is swelling and tenderness. No drainage. No foreign bodies. No mastoid tenderness. Tympanic membrane is not injected, not scarred, not perforated, not erythematous, not retracted and not bulging.  No middle ear effusion. No hemotympanum. No decreased hearing is noted.  Nose: Nose normal.  Mouth/Throat: Oropharynx is clear and moist.  L external ear with mild swelling with diffuse erythema and tenderness to palpation; no fluctuants.  +scaling dry rash in peri-auricular region; no drainage.  Eyes: Conjunctivae are normal. Pupils are equal, round, and reactive to light.  Neck: Normal range of motion. Neck supple.  Cardiovascular: Normal rate, regular rhythm and normal heart sounds.  Exam reveals no gallop and no friction rub.   No murmur heard. Pulmonary/Chest: Effort normal and breath sounds normal. She has no wheezes. She has no rales.  Lymphadenopathy:       Head (right side): No preauricular, no posterior auricular and no occipital adenopathy present.       Head (left side): Preauricular and posterior auricular adenopathy present. No occipital adenopathy present.    She has cervical adenopathy.       Right cervical: No superficial cervical, no deep cervical and no posterior cervical adenopathy present.      Left cervical: Superficial cervical adenopathy present.  Neurological: She is alert and oriented to person, place, and time.  Skin: She is not diaphoretic.  Psychiatric: She has a normal mood and affect. Her behavior is normal.  Nursing note and vitals reviewed.  Results for orders placed or performed in visit on 44/31/54  Basic Metabolic Panel (BMET)  Result  Value Ref Range   Sodium 135 135 - 145 mEq/L   Potassium 4.0 3.5 - 5.1 mEq/L   Chloride 101 96 - 112 mEq/L   CO2 26 19 - 32 mEq/L   Glucose, Bld 201 (H) 70 - 99 mg/dL   BUN 15 6 - 23 mg/dL   Creatinine, Ser 0.75 0.40 - 1.20 mg/dL   Calcium 9.2 8.4 - 10.5 mg/dL   GFR 88.60 >60.00 mL/min  CBC w/Diff  Result Value Ref Range   WBC 10.9 (H) 4.0 - 10.5 K/uL   RBC 5.44 (H) 3.87 - 5.11 Mil/uL   Hemoglobin 14.9 12.0 - 15.0 g/dL   HCT 44.2 36.0 - 46.0 %   MCV 81.2 78.0 - 100.0 fl   MCHC 33.8 30.0 - 36.0 g/dL   RDW 13.6 11.5 - 15.5 %   Platelets 298.0 150.0 - 400.0 K/uL   Neutrophils Relative %  77.7 (H) 43.0 - 77.0 %   Lymphocytes Relative 15.7 12.0 - 46.0 %   Monocytes Relative 3.5 3.0 - 12.0 %   Eosinophils Relative 3.0 0.0 - 5.0 %   Basophils Relative 0.1 0.0 - 3.0 %   Neutro Abs 8.5 (H) 1.4 - 7.7 K/uL   Lymphs Abs 1.7 0.7 - 4.0 K/uL   Monocytes Absolute 0.4 0.1 - 1.0 K/uL   Eosinophils Absolute 0.3 0.0 - 0.7 K/uL   Basophils Absolute 0.0 0.0 - 0.1 K/uL  INR/PT  Result Value Ref Range   INR 1.0 0.8 - 1.0 ratio   Prothrombin Time 11.0 9.6 - 13.1 sec   ROCEPHIN 1 GRAM IM ADMINISTERED.    Assessment & Plan:   1. Cellulitis of left ear   2. Psoriasis of scalp   3. Type 2 diabetes mellitus without complication   4. SVT (supraventricular tachycardia)     1. L Ear cellulitis:  New.  Secondary to active psoriasis; s/p Rocephin in office. Rx for Augmentin and Doxycycline provided. RTC in 24 hours for worsening; RTC for fever, increasing swelling/redness/pain. 2.  Psoriasis scalp: uncontrolled; likely source/etiology to secondary cellulitis. 3.  DMII: uncontrolled; monitor sugars closely with acute infection. 4.  SVT: scheduled for ablation in upcoming ten days.   Meds ordered this encounter  Medications  . amoxicillin-clavulanate (AUGMENTIN) 875-125 MG per tablet    Sig: Take 1 tablet by mouth 2 (two) times daily.    Dispense:  20 tablet    Refill:  0  . doxycycline  (VIBRAMYCIN) 100 MG capsule    Sig: Take 1 capsule (100 mg total) by mouth 2 (two) times daily.    Dispense:  20 capsule    Refill:  0  . fluconazole (DIFLUCAN) 150 MG tablet    Sig: Take 1 tablet (150 mg total) by mouth once. Repeat if needed    Dispense:  2 tablet    Refill:  0  . cefTRIAXone (ROCEPHIN) injection 1 g    Sig:     Order Specific Question:  Antibiotic Indication:    Answer:  Cellulitis    No Follow-up on file.     Isabeau Mccalla Elayne Guerin, M.D. [facility] [address] [place], [place]  [address] [phone] phone [phone] fax

## 2015-02-18 LAB — CBC WITH DIFFERENTIAL/PLATELET
BASOS PCT: 0.1 % (ref 0.0–3.0)
Basophils Absolute: 0 10*3/uL (ref 0.0–0.1)
EOS ABS: 0.3 10*3/uL (ref 0.0–0.7)
Eosinophils Relative: 3 % (ref 0.0–5.0)
HCT: 44.2 % (ref 36.0–46.0)
HEMOGLOBIN: 14.9 g/dL (ref 12.0–15.0)
LYMPHS PCT: 15.7 % (ref 12.0–46.0)
Lymphs Abs: 1.7 10*3/uL (ref 0.7–4.0)
MCHC: 33.8 g/dL (ref 30.0–36.0)
MCV: 81.2 fl (ref 78.0–100.0)
MONO ABS: 0.4 10*3/uL (ref 0.1–1.0)
Monocytes Relative: 3.5 % (ref 3.0–12.0)
NEUTROS ABS: 8.5 10*3/uL — AB (ref 1.4–7.7)
Neutrophils Relative %: 77.7 % — ABNORMAL HIGH (ref 43.0–77.0)
Platelets: 298 10*3/uL (ref 150.0–400.0)
RBC: 5.44 Mil/uL — AB (ref 3.87–5.11)
RDW: 13.6 % (ref 11.5–15.5)
WBC: 10.9 10*3/uL — ABNORMAL HIGH (ref 4.0–10.5)

## 2015-02-18 LAB — PROTIME-INR
INR: 1 ratio (ref 0.8–1.0)
Prothrombin Time: 11 s (ref 9.6–13.1)

## 2015-02-18 LAB — BASIC METABOLIC PANEL
BUN: 15 mg/dL (ref 6–23)
CHLORIDE: 101 meq/L (ref 96–112)
CO2: 26 meq/L (ref 19–32)
CREATININE: 0.75 mg/dL (ref 0.40–1.20)
Calcium: 9.2 mg/dL (ref 8.4–10.5)
GFR: 88.6 mL/min (ref >60.00)
GLUCOSE: 201 mg/dL — AB (ref 70–99)
POTASSIUM: 4 meq/L (ref 3.5–5.1)
Sodium: 135 mEq/L (ref 135–145)

## 2015-02-19 ENCOUNTER — Telehealth: Payer: Self-pay | Admitting: Internal Medicine

## 2015-02-19 NOTE — Telephone Encounter (Signed)
Amoxicillin and Doxyciline for 10 days for a skin infection.  I have told her to hold all medications the morning of the procedure.  She verbalized the understanding

## 2015-02-19 NOTE — Telephone Encounter (Signed)
New Msg        Pt has started new medication and would like to know if her instructions have changed for procedure next week?   Please return call.

## 2015-02-22 ENCOUNTER — Encounter (HOSPITAL_COMMUNITY): Payer: Self-pay | Admitting: Pharmacy Technician

## 2015-02-23 MED ORDER — SODIUM CHLORIDE 0.9 % IV SOLN
INTRAVENOUS | Status: DC
  Administered 2015-02-24: 07:00:00 via INTRAVENOUS

## 2015-02-24 ENCOUNTER — Encounter (HOSPITAL_COMMUNITY)
Admission: RE | Disposition: A | Discharge status: Home / Self Care | Payer: Self-pay | Source: Ambulatory Visit | Attending: Internal Medicine

## 2015-02-24 ENCOUNTER — Ambulatory Visit (HOSPITAL_COMMUNITY)
Admission: RE | Admit: 2015-02-24 | Discharge: 2015-02-24 | Disposition: A | Discharge status: Home / Self Care | Payer: 59 | Source: Ambulatory Visit | Attending: Internal Medicine | Admitting: Internal Medicine

## 2015-02-24 ENCOUNTER — Encounter (HOSPITAL_COMMUNITY): Payer: Self-pay | Admitting: General Practice

## 2015-02-24 DIAGNOSIS — E785 Hyperlipidemia, unspecified: Secondary | ICD-10-CM | POA: Insufficient documentation

## 2015-02-24 DIAGNOSIS — Z79899 Other long term (current) drug therapy: Secondary | ICD-10-CM | POA: Insufficient documentation

## 2015-02-24 DIAGNOSIS — I10 Essential (primary) hypertension: Secondary | ICD-10-CM | POA: Diagnosis not present

## 2015-02-24 DIAGNOSIS — Z9889 Other specified postprocedural states: Secondary | ICD-10-CM

## 2015-02-24 DIAGNOSIS — E119 Type 2 diabetes mellitus without complications: Secondary | ICD-10-CM | POA: Diagnosis not present

## 2015-02-24 DIAGNOSIS — Z8679 Personal history of other diseases of the circulatory system: Secondary | ICD-10-CM

## 2015-02-24 DIAGNOSIS — I471 Supraventricular tachycardia: Secondary | ICD-10-CM | POA: Diagnosis not present

## 2015-02-24 DIAGNOSIS — Z87891 Personal history of nicotine dependence: Secondary | ICD-10-CM | POA: Insufficient documentation

## 2015-02-24 HISTORY — PX: SUPRAVENTRICULAR TACHYCARDIA ABLATION: SHX5492

## 2015-02-24 HISTORY — DX: Adverse effect of unspecified anesthetic, initial encounter: T41.45XA

## 2015-02-24 HISTORY — DX: Other complications of anesthesia, initial encounter: T88.59XA

## 2015-02-24 LAB — GLUCOSE, CAPILLARY
GLUCOSE-CAPILLARY: 194 mg/dL — AB (ref 70–99)
GLUCOSE-CAPILLARY: 156 mg/dL — AB (ref 70–99)

## 2015-02-24 LAB — HCG, QUANTITATIVE, PREGNANCY: hCG, Beta Chain, Quant, S: <1 m[IU]/mL (ref <5)

## 2015-02-24 SURGERY — SUPRAVENTRICULAR TACHYCARDIA ABLATION
Anesthesia: LOCAL

## 2015-02-24 MED ORDER — MIDAZOLAM HCL 5 MG/5ML IJ SOLN
INTRAMUSCULAR | Status: AC
  Filled 2015-02-24: qty 5

## 2015-02-24 MED ORDER — SODIUM CHLORIDE 0.9 % IJ SOLN: 3.0000 mL | INTRAMUSCULAR | Status: DC | PRN

## 2015-02-24 MED ORDER — CANAGLIFLOZIN 100 MG PO TABS
100.0000 mg | ORAL_TABLET | Freq: Every day | ORAL | Status: DC
  Administered 2015-02-24: 100 mg via ORAL
  Filled 2015-02-24: qty 1

## 2015-02-24 MED ORDER — ONDANSETRON HCL 4 MG/2ML IJ SOLN: 4.0000 mg | Freq: Four times a day (QID) | INTRAMUSCULAR | Status: DC | PRN

## 2015-02-24 MED ORDER — FENTANYL CITRATE 0.05 MG/ML IJ SOLN
INTRAMUSCULAR | Status: AC
Start: 2015-02-24 — End: 2015-02-24
  Filled 2015-02-24: qty 2

## 2015-02-24 MED ORDER — SODIUM CHLORIDE 0.9 % IV SOLN: 250.0000 mL | INTRAVENOUS | Status: DC | PRN

## 2015-02-24 MED ORDER — FENTANYL CITRATE 0.05 MG/ML IJ SOLN
INTRAMUSCULAR | Status: AC
  Filled 2015-02-24: qty 2

## 2015-02-24 MED ORDER — METOPROLOL TARTRATE 25 MG PO TABS
25.0000 mg | ORAL_TABLET | Freq: Two times a day (BID) | ORAL | Status: DC
  Administered 2015-02-24: 25 mg via ORAL
  Filled 2015-02-24 (×2): qty 1

## 2015-02-24 MED ORDER — METOPROLOL TARTRATE 25 MG PO TABS: 25.0000 mg | ORAL_TABLET | Freq: Two times a day (BID) | ORAL | Status: DC

## 2015-02-24 MED ORDER — BUPIVACAINE HCL (PF) 0.25 % IJ SOLN
INTRAMUSCULAR | Status: AC
  Filled 2015-02-24: qty 60

## 2015-02-24 MED ORDER — SODIUM CHLORIDE 0.9 % IJ SOLN
3.0000 mL | Freq: Two times a day (BID) | INTRAMUSCULAR | Status: DC
  Administered 2015-02-24: 3 mL via INTRAVENOUS

## 2015-02-24 MED ORDER — METFORMIN HCL 500 MG PO TABS
1000.0000 mg | ORAL_TABLET | Freq: Two times a day (BID) | ORAL | Status: DC
  Administered 2015-02-24: 1000 mg via ORAL
  Filled 2015-02-24 (×2): qty 2

## 2015-02-24 MED ORDER — ACETAMINOPHEN 325 MG PO TABS: 650.0000 mg | ORAL_TABLET | ORAL | Status: DC | PRN

## 2015-02-24 MED ORDER — HEPARIN (PORCINE) IN NACL 2-0.9 UNIT/ML-% IJ SOLN
INTRAMUSCULAR | Status: AC
  Filled 2015-02-24: qty 500

## 2015-02-24 NOTE — Progress Notes (Signed)
Patient d/c this evening. Assessments remains unchanged prior to discharge. D/c instructions given and all questions answered.

## 2015-02-24 NOTE — Discharge Instructions (Signed)
We changed your metoprolol to 25 mg twice a day for 2 weeks then 25 mg twice every other day for 2 weeks then stop.   No driving for 5 days, no lifting over 5 pounds for 1 week No sex for 1 week  May return to work in 1 week  [facility] [address] [facility] at [phone] if any bleeding, swelling or drainage at cath site.  May shower, no tub baths or pools for 1 week

## 2015-02-24 NOTE — CV Procedure (Signed)
EP procedure note  Preprocedure diagnosis: Recurrent SVT  Postprocedure diagnosis: Recurrent SVT  Description of the procedure: The patient is a 44 year old woman who presents for catheter ablation of SVT. She has failed multiple medical therapies, and is been found to have a narrow QRS tachycardia at 200 bpm, easily terminated with adenosine. She is now referred for catheter ablation. After informed consent was obtained, the patient was taken to the diagnostic EP lab in the fasting state. After the usual preparation draping, a 6 French quadripolar catheter was inserted percutaneously into the right femoral vein and advanced to the right ventricle. A 6 French quadripolar catheter was inserted percutaneously in the right femoral vein and advanced to the His bundle region. A 6 French hexapolar catheter was inserted percutaneously in the right jugular vein and advanced to the coronary sinus. After measurement of the basic intervals, rapid ventricular pacing was carried out from the right ventricle at a pacing cycle length of 600 ms. The pacing interval was stepwise decreased down to 300 ms where VA block was demonstrated. During rapid ventricular pacing, the atrial activation was midline and decremental. Next, programmed ventricular stimulation was carried out from the right ventricle at a pacing cycle length of 500 ms. The S1-S2 interval was stepwise decreased down to 270 ms with retrograde AV node ERP was observed. During programmed ventricular stimulation, the atrial activation was midline and decremental. Next, programmed atrial stimulation was carried out from the coronary sinus at a pacing cycle length of 500 ms. The S1-S2 interval was stepwise decreased down to 260 ms were the induction of SVT was observed. During programmed atrial stimulation, there are multiple AH jumps, multiple echo beats, prior to the initiation of SVT. During tachycardia, PVCs were placed at the time of his bundle refractoriness, and  did not pre-excited the atrium. 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 During SVT mapping was carried out. The VA interval was 0. The cycle length was 300 ms.   A 7 French quadripolar ablation catheter was inserted percutaneously into the right femoral vein and advanced into the region of the AV node, coronary sinus, and tricuspid valve annulus. Mapping was carried out. Radiofrequency energy application was carried out resulting in accelerated junctional rhythm. Following this, additional rapid atrial pacing was carried out and the PR interval remained less than the RR interval. There was no inducible SVT. Additional programmed atrial stimulation was then carried out from the coronary sinus at a pacing cycle length of 500 ms. The S1-S2 interval was decreased down to 200 ms, resulting in the AV node ERP. There were multiple jumps and echo beats but no SVT. The patient was observed for approximately 15 minutes and had no inducible or recurrent SVT, and the PR interval was much less than the RR interval. The catheters were removed, hemostasis was observed, and the patient was returned to her room in satisfactory condition.  Complications: There were no immediate procedure complications  Results. 1. Baseline ECG. The baseline ECG demonstrates normal sinus rhythm with no ventricular preexcitation. 2. Baseline intervals. The sinus node cycle length was 580 ms. The HV interval was 35 ms. The AH interval was 103 ms. The QRS duration was 115 ms. 3. Rapid ventricular pacing. Rapid ventricular pacing was carried out from the right ventricle and stepwise decreased down to 300 ms where VA block was observed. During rapid ventricular pacing, the atrial activation was midline and decremental. 4.  Programmed ventricular stimulation. Programmed ventricular stimulation was carried out from the right ventricle  at a pacing cycle length of 500 ms. The S1-S2 interval was decreased down to 270 ms with retrograde AV node ERP was observed. During programmed ventricular stimulation, the atrial activation was midline and decremental. 5. Rapid atrial pacing. Rapid atrial pacing was carried out from the coronary sinus at a pacing cycle length of 600 ms. The patient interval was decreased down to 360 ms were AV block was demonstrated (post ablation) 6. Programmed atrial stimulation. Programmed atrial stimulation was carried out from the coronary sinus at a pacing cycle length of 500 ms. The S1-S2 interval was stepwise decreased down to 260 ms where SVT was observed. During programmed atrial stimulation, there were multiple AH jumps and echo beats and inducible SVT. Following catheter ablation, there were residual AH jumps and echo beats but no inducible SVT. 7. Arrhythmias observed. 1. AV node reentrant tachycardia, cycle length 300 ms initiation was with rapid atrial pacing, the duration was sustained, termination was with rapid atrial pacing.  8. Mapping.  Mapping of koch's triangle demonstrated a very small slow pathway region. 9. Radiofrequency energy application. A total of 8 radiofrequency energy applications were delivered in the region of Koch's triangle between site 8 and site 10.  Conclusion: Successful electrophysiologic study and catheter ablation of AV node reentrant tachycardia with the patient being rendered noninducible at the end of the procedure.  Lewayne Bunting, M.D

## 2015-02-24 NOTE — H&P (Signed)
HPI Julia [name] is referred today for evaluation of SVT. She is a pleasnat 44 yo woman with a h/o palpitations and documented SVT at rates of over 200 beats a minute. She has had multiple ER visits. She has been treated with increasing doses of metoprolol to try and control her symptoms. She feels fatigue and weak when she goes into SVT and this lasts for over 12 hours when the episodes resolved. She can stop her episodes with IV adenosine. She gets sob but has never had frank syncope with her SVT. She feels terrible on high dose metoprolol.  No Known Allergies   Current Outpatient Prescriptions  Medication Sig Dispense Refill  . Blood Glucose Monitoring Suppl (ONE TOUCH ULTRA SYSTEM KIT) W/DEVICE KIT 1 kit by Does not apply route once. 1 each 0  . glucose blood test strip Use as instructed 100 each 12  . lamoTRIgine (LAMICTAL) 100 MG tablet Take 1 tablet (100 mg total) by mouth daily. 30 tablet 3  . Lancets (ONETOUCH ULTRASOFT) lancets Use as instructed 100 each 12  . metFORMIN (GLUMETZA) 1000 MG (MOD) 24 hr tablet Take 2 tablets (2,000 mg total) by mouth daily with breakfast. 180 tablet 1  . metoprolol (LOPRESSOR) 100 MG tablet Take 1 tablet (100 mg total) by mouth 2 (two) times daily. 180 tablet 3   No current facility-administered medications for this visit.     Past Medical History  Diagnosis Date  . Diabetes mellitus   . HTN (hypertension)   . Hyperlipidemia   . SVT (supraventricular tachycardia)     ROS:  All systems reviewed and negative except as noted in the HPI.   Past Surgical History  Procedure Laterality Date  . Cesarean section       Family History  Problem Relation Age of Onset  . Hypertension Mother   . Thyroid disease Mother   . Diabetes Mother   . Thyroid disease Sister   . Diabetes Maternal Grandmother   . Hyperlipidemia Other   . Thyroid disease  Other      History   Social History  . Marital Status: Married    Spouse Name: N/A    Number of Children: N/A  . Years of Education: N/A   Occupational History  . Not on file.   Social History Main Topics  . Smoking status: Former Smoker    Types: Cigarettes    Quit date: 12/16/2002  . Smokeless tobacco: Not on file  . Alcohol Use: 1.0 oz/week    2 Not specified per week  . Drug Use: No  . Sexual Activity: Yes    Birth Control/ Protection: None   Other Topics Concern  . Not on file   Social History Narrative     BP 118/80 mmHg  Pulse 78  Ht 5' 4"  (1.626 m)  Wt 180 lb 12.8 oz (82.01 kg)  BMI 31.02 kg/m2  Physical Exam:  Well appearing middle aged woman, NAD HEENT: Unremarkable Neck: No JVD, no thyromegally Lymphatics: No adenopathy Back: No CVA tenderness Lungs: Clear HEART: Regular rate rhythm, no murmurs, no rubs, no clicks Abd: soft, obese, positive bowel sounds, no organomegally, no rebound, no guarding Ext: 2 plus pulses, no edema, no cyanosis, no clubbing Skin: No rashes no nodules Neuro: CN II through XII intact, motor grossly intact  EKG- NSR with no pre-excitation.  DEVICE  Normal device function. See PaceArt for details.   Assess/Plan:            SVT (supraventricular  tachycardia) - Evans Lance, MD at 12/31/2014 2:04 PM     Status: Written Related Problem: SVT (supraventricular tachycardia)   Expand All Collapse All   Her SVT is well controlled on high dose metoprolol but she feels terrible. She is strongly considering cathter ablation. I have discussed the risks/benefits/goals/expectations of the procedure with the patient and she wishes to proceed. She will call us to schedule.             HTN (hypertension) - Evans Lance, MD at 12/31/2014 2:05 PM     Status: Written Related Problem: HTN (hypertension)   Expand All Collapse All   Her  blood pressure has been well controlled on lopressor 100 bid. She may require a different blood pressure lowering medication if she is able to stop meotprolol after ablation.             Obesity, Class I, BMI 30-34.9 - Evans Lance, MD at 12/31/2014 2:05 PM     Status: Written Related Problem: Obesity, Class I, BMI 30-34.9   Expand All Collapse All   She needs to lose weight. Hopefully we can work on this after her ablation        Since prior clinic visit, no change in the history, physical exam, assessment and plan. For EPS/RFA of SVT  Cristopher Peru, M.D.

## 2015-02-24 NOTE — Discharge Summary (Signed)
Physician Discharge Summary       Patient ID: Julia [name] MRN: [id number] DOB/AGE: Nov 24, 1971 44 y.o.  Admit date: 02/24/2015 Discharge date: 02/24/2015 Primary Cardiologist:Dr. Lovena Le   Discharge Diagnoses:  Principal Problem:   SVT (supraventricular tachycardia) Active Problems:   S/P radiofrequency ablation operation for arrhythmia   Diabetes mellitus   HTN (hypertension)   Discharged Condition: good  Procedures: ablation for SVT by Dr. Lovena Le 02/24/15  Hospital Course: 44 yo woman with a h/o palpitations and documented SVT at rates of over 200 beats a minute. She has had multiple ER visits. She has been treated with increasing doses of metoprolol to try and control her symptoms. She feels fatigue and weak when she goes into SVT and this lasts for over 12 hours when the episodes resolved. She can stop her episodes with IV adenosine. She gets sob but has never had frank syncope with her SVT. She feels terrible on high dose metoprolol. She was seen by Dr. Lovena Le and after discussion plans were made for elective EP study and ablation.  This was undertaken on 02/24/15 and pt did well.  No complications. By that evening she ambulated without complications and was found stable for discharge by Dr. Lovena Le.     Consults: None  Significant Diagnostic Studies:  BMET    Component Value Date/Time   NA 135 02/17/2015 1600   K 4.0 02/17/2015 1600   CL 101 02/17/2015 1600   CO2 26 02/17/2015 1600   GLUCOSE 201* 02/17/2015 1600   BUN 15 02/17/2015 1600   CREATININE 0.75 02/17/2015 1600   CREATININE 0.57 09/30/2013 1012   CALCIUM 9.2 02/17/2015 1600   GFRNONAA >90 07/13/2014 1427   GFRAA >90 07/13/2014 1427    CBC    Component Value Date/Time   WBC 10.9* 02/17/2015 1600   WBC 7.2 07/30/2012 1410   RBC 5.44* 02/17/2015 1600   RBC 5.57* 07/30/2012 1410   HGB 14.9 02/17/2015 1600   HGB 15.1 07/30/2012 1410   HCT 44.2 02/17/2015 1600   HCT 47.7 07/30/2012 1410   PLT 298.0  02/17/2015 1600   MCV 81.2 02/17/2015 1600   MCV 85.7 07/30/2012 1410   MCH 28.9 07/13/2014 1427   MCH 27.1 07/30/2012 1410   MCHC 33.8 02/17/2015 1600   MCHC 31.7* 07/30/2012 1410   RDW 13.6 02/17/2015 1600   LYMPHSABS 1.7 02/17/2015 1600   MONOABS 0.4 02/17/2015 1600   EOSABS 0.3 02/17/2015 1600   BASOSABS 0.0 02/17/2015 1600       Discharge Exam: Blood pressure 103/69, pulse 84, temperature 97.9 F (36.6 C), temperature source Oral, resp. rate 19, height _0  (1.626 m), weight 180 lb (81.647 kg), last menstrual period 02/04/2015, SpO2 98 %.   Disposition: 01-Home or Self Care     Medication List    TAKE these medications        amoxicillin-clavulanate 875-125 MG per tablet  Commonly known as:  AUGMENTIN  Take 1 tablet by mouth 2 (two) times daily.     doxycycline 100 MG capsule  Commonly known as:  VIBRAMYCIN  Take 1 capsule (100 mg total) by mouth 2 (two) times daily.     fluconazole 150 MG tablet  Commonly known as:  DIFLUCAN  Take 1 tablet (150 mg total) by mouth once. Repeat if needed     glucose blood test strip  Use as instructed     INVOKANA 100 MG Tabs tablet  Generic drug:  canagliflozin  Take 100 mg by mouth  daily.     lamoTRIgine 100 MG tablet  Commonly known as:  LAMICTAL  Take 1 tablet (100 mg total) by mouth daily.     metFORMIN 1000 MG tablet  Commonly known as:  GLUCOPHAGE  Take 1,000 mg by mouth 2 (two) times daily with a meal.     metoprolol tartrate 25 MG tablet  Commonly known as:  LOPRESSOR  Take 1 tablet (25 mg total) by mouth 2 (two) times daily. Take twice a day for 2 weeks then twice every other day for 2 weeks.     nystatin-triamcinolone cream  Commonly known as:  MYCOLOG II  Apply 1 application topically 2 (two) times daily as needed (for itching).     ONE TOUCH ULTRA SYSTEM KIT W/DEVICE Kit  1 kit by Does not apply route once.     onetouch ultrasoft lancets  Use as instructed       Follow-up Information    Follow  up with Cristopher Peru, MD.   Specialty:  Cardiology   Why:  in 4 weeks the office will call with date and time   Contact information:   [address] [address] [phone]        Discharge Instructions: We changed your metoprolol to 25 mg twice a day for 2 weeks then 25 mg twice every other day for 2 weeks then stop.   No driving for 5 days, no lifting over 5 pounds for 1 week No sex for 1 week  May return to work in 1 week  Call [employer] at [phone] if any bleeding, swelling or drainage at cath site.  May shower, no tub baths or pools for 1 week  Signed: [facility]: [facility] 02/24/2015, 7:26 PM  Time spent on discharge : <30 minutes.     EP Attending  Patient seen and examined. Agree with above.  Mikle Bosworth.D.

## 2015-02-24 NOTE — Progress Notes (Signed)
Site area:  Rt groin Site Prior to Removal:  Level  0 Pressure Applied For:  20 minutes Manual:   yes Patient Status During Pull:  stable Post Pull Site:  Level  0 Post Pull Instructions Given:  yes Post Pull Pulses Present:  yes Dressing Applied:  Small tegaderm Bedrest begins @  1050 Comments:  0 complications

## 2015-02-24 NOTE — Progress Notes (Signed)
Site area: rt ij venous sheath Site Prior to Removal:  Level 0 Pressure Applied For: 10 minutes Manual:   yes Patient Status During Pull:  stable Post Pull Site:  Level  0 Post Pull Instructions Given:  yes Post Pull Pulses Present:  na Dressing Applied:  Small tegaderm Bedrest begins @  Comments: 0 complications

## 2015-03-09 ENCOUNTER — Telehealth: Payer: Self-pay

## 2015-03-09 NOTE — Telephone Encounter (Signed)
Pt dentist office called wanting to know if okay to complete cleaning.  Instructed to submit request in writing and provided with our fax number.

## 2015-03-12 ENCOUNTER — Other Ambulatory Visit: Payer: Self-pay | Admitting: Family Medicine

## 2015-03-31 ENCOUNTER — Other Ambulatory Visit: Payer: Self-pay

## 2015-04-02 ENCOUNTER — Other Ambulatory Visit: Payer: Self-pay

## 2015-04-02 ENCOUNTER — Encounter: Payer: Self-pay | Admitting: Internal Medicine

## 2015-04-02 ENCOUNTER — Ambulatory Visit (INDEPENDENT_AMBULATORY_CARE_PROVIDER_SITE_OTHER): Payer: 59 | Admitting: Internal Medicine

## 2015-04-02 VITALS — BP 126/90 | HR 88 | Ht 64.0 in | Wt 184.0 lb

## 2015-04-02 DIAGNOSIS — I471 Supraventricular tachycardia: Secondary | ICD-10-CM

## 2015-04-02 DIAGNOSIS — Z9889 Other specified postprocedural states: Secondary | ICD-10-CM

## 2015-04-02 DIAGNOSIS — I10 Essential (primary) hypertension: Secondary | ICD-10-CM

## 2015-04-02 DIAGNOSIS — Z8679 Personal history of other diseases of the circulatory system: Secondary | ICD-10-CM

## 2015-04-02 MED ORDER — HYDROCHLOROTHIAZIDE 25 MG PO TABS: 25.0000 mg | ORAL_TABLET | Freq: Every day | ORAL | Status: DC

## 2015-04-02 NOTE — Assessment & Plan Note (Signed)
Her blood pressure has been up. As she has reduced her metoprolol. I will start her on hydrochlorothiazide today. She will continue her very low dose beta blocker. I'll refer her back to Dr. Dr. Elease HashimotoNahser for additional adjustments in medications as he deems appropriate. She is encouraged to exercise, lose weight, and maintain a low-sodium diet.

## 2015-04-02 NOTE — Progress Notes (Signed)
HPI Julia [name] returns today for follow-up. She is a 44 year old woman with a history of tachypalpitations and documented SVT who underwent catheter ablation several weeks ago. In the interim, she tried weaning herself off of metoprolol but had palpitations. She is currently on 25 mg twice a day. Her blood pressure is slightly elevated. She notes that previously she had taken other antihypertensive medications. She has occasional palpitations despite metoprolol. No sustained heart racing. Allergies  Allergen Reactions  . Other Itching and Rash    Epidural medication given during C section     Current Outpatient Prescriptions  Medication Sig Dispense Refill  . Blood Glucose Monitoring Suppl (ONE TOUCH ULTRA SYSTEM KIT) W/DEVICE KIT 1 kit by Does not apply route once. 1 each 0  . glucose blood test strip Use as instructed 100 each 12  . INVOKANA 100 MG TABS tablet Take 100 mg by mouth daily.   1  . Lancets (ONETOUCH ULTRASOFT) lancets Use as instructed 100 each 12  . metFORMIN (GLUCOPHAGE) 1000 MG tablet Take 1,000 mg by mouth 2 (two) times daily with a meal.    . metoprolol tartrate (LOPRESSOR) 25 MG tablet Take 25 mg by mouth 2 (two) times daily.    . fluticasone (FLONASE) 50 MCG/ACT nasal spray Place 2 sprays into both nostrils daily.  6  . hydrochlorothiazide (HYDRODIURIL) 25 MG tablet Take 1 tablet (25 mg total) by mouth daily. 90 tablet 3   No current facility-administered medications for this visit.     Past Medical History  Diagnosis Date  . Diabetes mellitus   . HTN (hypertension)   . Hyperlipidemia   . SVT (supraventricular tachycardia)   . SVT (supraventricular tachycardia) 11/08/2015  . Complication of anesthesia     I had a reaction to a epidural "    ROS:   All systems reviewed and negative except as noted in the HPI.   Past Surgical History  Procedure Laterality Date  . Cesarean section    . Ablation of dysrhythmic focus  02/24/2015    SVT  .  Supraventricular tachycardia ablation N/A 02/24/2015    Procedure: SUPRAVENTRICULAR TACHYCARDIA ABLATION;  Surgeon: Evans Lance, MD;  Location[facility] CATH LAB;  Service: Cardiovascular;  Laterality: N/A;     Family History  Problem Relation Age of Onset  . Hypertension Mother   . Thyroid disease Mother   . Diabetes Mother   . Thyroid disease Sister   . Diabetes Maternal Grandmother   . Hyperlipidemia Other   . Thyroid disease Other      History   Social History  . Marital Status: Married    Spouse Name: N/A  . Number of Children: N/A  . Years of Education: N/A   Occupational History  . Not on file.   Social History Main Topics  . Smoking status: Former Smoker    Types: Cigarettes    Quit date: 12/16/2002  . Smokeless tobacco: Never Used  . Alcohol Use: 1.0 oz/week    2 Standard drinks or equivalent per week  . Drug Use: No  . Sexual Activity: Yes    Birth Control/ Protection: None   Other Topics Concern  . Not on file   Social History Narrative     BP 126/90 mmHg  Pulse 88  Ht 5' 4"  (1.626 m)  Wt 184 lb (83.462 kg)  BMI 31.57 kg/m2  Physical Exam:  Well appearing 44 yo woman, NAD HEENT: Unremarkable Neck:  No JVD, no  thyromegally Back:  No CVA tenderness Lungs:  Clear, with no wheezes, rales, or rhonchi. HEART:  Regular rate rhythm, no murmurs, no rubs, no clicks Abd:  soft, positive bowel sounds, no organomegally, no rebound, no guarding Ext:  2 plus pulses, trace peripheral edema, no cyanosis, no clubbing Skin:  No rashes no nodules Neuro:  CN II through XII intact, motor grossly intact  EKG - normal sinus rhythm with poor R-wave progression  Assess/Plan:

## 2015-04-02 NOTE — Assessment & Plan Note (Signed)
The patient is doing well status post catheter ablation. She will reduce her dose of metoprolol to 12.5 mg twice daily.

## 2015-04-02 NOTE — Patient Instructions (Signed)
Medication Instructions:  Your physician has recommended you make the following change in your medication:  1) START Hydrochlorothiazide 25 mg daily   Labwork: NONE  Testing/Procedures: NONE  Follow-Up: Follow up with Dr. Ladona Ridgelaylor as needed.   Any Other Special Instructions Will Be Listed Below (If Applicable).

## 2015-04-06 ENCOUNTER — Telehealth: Payer: Self-pay | Admitting: Internal Medicine

## 2015-04-06 NOTE — Telephone Encounter (Signed)
New Message  Pt requested to speak w/ Rn about medication change. Pt was offered (at last OV) a medication In place hydrochlorozone (sp?). Pt wanted to discuss more about the alternative medication. Please call back and dsicuss.

## 2015-04-06 NOTE — Telephone Encounter (Addendum)
lft message on machine for her to return our call

## 2015-04-07 NOTE — Telephone Encounter (Signed)
Follow up      [facility]

## 2015-04-07 NOTE — Telephone Encounter (Signed)
Calling stating that Dr. Ladona Ridgelaylor started her on HCTZ 25 mg daily on 4/29. States she thinks the dose is too much.  She has lost 10 lbs since Sat.; mouth dry and feels like she is dehydrated.  BP 120/95 and HR has been 100-110 a few times.  States she didn't take it this AM.  Has started eating bananas because she is afraid her K+ might be low.  Advised will forward to Dr. Ladona Ridgelaylor and Anselm PancoastKelly Lanier,RN for recommendations.  Suggested she could hold dose for in AM or take 1/2 tablet until hear back from Dr. Ladona Ridgelaylor.

## 2015-04-09 NOTE — Telephone Encounter (Signed)
F/u ° ° °Pt calling concerning previous message. Please call pt.  °

## 2015-04-09 NOTE — Telephone Encounter (Signed)
LMTCB. Will forward message to Dr. Ladona Ridgelaylor at this time resending previous request.

## 2015-04-14 ENCOUNTER — Encounter: Payer: Self-pay | Admitting: Internal Medicine

## 2015-04-14 MED ORDER — LISINOPRIL-HYDROCHLOROTHIAZIDE 10-12.5 MG PO TABS: 1.0000 | ORAL_TABLET | Freq: Every day | ORAL | Status: DC

## 2015-04-14 MED ORDER — METOPROLOL TARTRATE 25 MG PO TABS: 12.5000 mg | ORAL_TABLET | Freq: Two times a day (BID) | ORAL | Status: DC

## 2015-04-14 NOTE — Telephone Encounter (Addendum)
She felt completely drained on the HCTZ to the point she couldn't work.  So she stopped and and her BP went back up running 128/91-110/88.  She says she has been on Lisinopril in the past but feels she needs some fluid pill also.  She did not try the HCYZ at lower dose jsut stopped.  She is aware to continue the Metoprolol and start the Lisinopril/HCT 10/12.5mg  daily.  She will follow up with Dr Elease HashimotoNahser going forward if continues to have problems with her BP

## 2015-06-22 ENCOUNTER — Encounter: Payer: Self-pay | Admitting: *Deleted

## 2015-09-21 NOTE — H&P (Signed)
Alcario Droughtmy E Hedman  DICTATION # [id number] CSN# [id number]   Meriel PicaHOLLAND,Corydon Schweiss M, MD 09/21/2015 9:03 AM

## 2015-09-22 NOTE — H&P (Signed)
[name]:  [name], Julia                   ACCOUNT NO.:  [id number]  MEDICAL RECORD NO.:  [id number]  LOCATION:                               [facility]  PHYSICIAN:  Duke Salviaichard M. Marcelle OverlieHolland, M.D.DATE OF BIRTH:  1971/02/07  DATE OF ADMISSION:  10/08/2015 DATE OF DISCHARGE:                             HISTORY & PHYSICAL   CHIEF COMPLAINT:  Vulvar persistent condyloma.  HISTORY OF PRESENT ILLNESS:  A 44 year old, G1, P1.  This patient has been evaluated over the last 6 months with vulvar condyloma, failed Veregen treatment due to significant erythema, has persistence of periclitoral condyloma presents now for CO2 laser ablation of vulvar condyloma.  This procedure including specific risks related to bleeding, infection, persistence of the condyloma, and the need for re-treatment discussed with her which she understands and accepts.  PAST MEDICAL HISTORY:  ALLERGIES:  None.  CURRENT MEDICATIONS:  Metoprolol, Invokana, and metformin.  PREVIOUS SURGERIES:  She has had 1 prior cesarean section in 2006.  She is currently separated using condoms for contraception.  REVIEW OF SYSTEMS:  Significant for history of positive HSV 1 and 2, treated in the past.  Also history of Trichomonas within the last 6 months that was treated with tinidazole.  FAMILY HISTORY:  Significant for thyroid disease, UTI, arthritis, diabetes, hypertension.  SOCIAL HISTORY:  Denies alcohol, tobacco, or drug use.  She is separated.  Julia [name] is her medical doctor.  Last Pap December 2015 was normal.  PHYSICAL EXAMINATION:  VITAL SIGNS:  Temp 98.2, blood pressure 120/78. HEENT:  Unremarkable. NECK:  Supple without masses. LUNGS:  Clear. CARDIOVASCULAR:  Regular rate and rhythm without murmurs, rubs, or gallops. BREASTS:  Without masses. ABDOMEN:  Soft, flat, nontender.  In the vulvar area, there are characteristic periclitoral condyloma.  The lower vulva was unremarkable on prior colposcopy.  Vagina and cervix  normal.  Uterus mid position, normal size.  Adnexa negative. EXTREMITIES:  Unremarkable. NEUROLOGIC:  Unremarkable.  IMPRESSION:  Persistent vulvar condyloma.  PLAN:  CO2 laser ablation.  Procedure and risks discussed as above.     Julia [name] M. Marcelle OverlieHolland, M.D.     [facility]/MEDQ  D:  09/21/2015  T:  09/21/2015  Job:  [id number]

## 2015-10-01 ENCOUNTER — Encounter (HOSPITAL_BASED_OUTPATIENT_CLINIC_OR_DEPARTMENT_OTHER): Payer: Self-pay | Admitting: *Deleted

## 2015-10-01 NOTE — Progress Notes (Addendum)
NPO AFTER MN.  ARRIVE AT 0600.  NEEDS ISTAT AND URINE PREG.  CURRENT EKG IN CHART AND EPIC.  WILL TAKE METOPROLOL AND PREVACID AM DOS W/ SIPS OF WATER.

## 2015-10-07 NOTE — Anesthesia Preprocedure Evaluation (Addendum)
Anesthesia Evaluation  Patient identified by MRN, date of birth, ID band Patient awake    Reviewed: Allergy & Precautions, NPO status , Patient's Chart, lab work & pertinent test results  History of Anesthesia Complications (+) history of anesthetic complications  Airway Mallampati: II  TM Distance: >3 FB Neck ROM: Full    Dental no notable dental hx.    Pulmonary neg pulmonary ROS, former smoker,    Pulmonary exam normal breath sounds clear to auscultation       Cardiovascular hypertension, Pt. on medications Normal cardiovascular exam Rhythm:Regular Rate:Normal  H/O SVT s/p ablation   Neuro/Psych negative neurological ROS  negative psych ROS   GI/Hepatic negative GI ROS, Neg liver ROS, GERD  Medicated,  Endo/Other  diabetes, Type 2, Oral Hypoglycemic Agents  Renal/GU negative Renal ROS  negative genitourinary   Musculoskeletal negative musculoskeletal ROS (+)   Abdominal (+) + obese,   Peds negative pediatric ROS (+)  Hematology negative hematology ROS (+)   Anesthesia Other Findings   Reproductive/Obstetrics negative OB ROS                            Anesthesia Physical Anesthesia Plan  ASA: II  Anesthesia Plan: General   Post-op Pain Management:    Induction: Intravenous  Airway Management Planned: LMA  Additional Equipment:   Intra-op Plan:   Post-operative Plan: Extubation in OR  Informed Consent: I have reviewed the patients History and Physical, chart, labs and discussed the procedure including the risks, benefits and alternatives for the proposed anesthesia with the patient or authorized representative who has indicated his/her understanding and acceptance.   Dental advisory given  Plan Discussed with: CRNA  Anesthesia Plan Comments:         Anesthesia Quick Evaluation

## 2015-10-08 ENCOUNTER — Ambulatory Visit (HOSPITAL_BASED_OUTPATIENT_CLINIC_OR_DEPARTMENT_OTHER): Payer: 59 | Admitting: Anesthesiology

## 2015-10-08 ENCOUNTER — Encounter (HOSPITAL_BASED_OUTPATIENT_CLINIC_OR_DEPARTMENT_OTHER)
Admission: RE | Disposition: A | Discharge status: Home / Self Care | Payer: Self-pay | Source: Ambulatory Visit | Attending: Obstetrics and Gynecology

## 2015-10-08 ENCOUNTER — Ambulatory Visit (HOSPITAL_BASED_OUTPATIENT_CLINIC_OR_DEPARTMENT_OTHER)
Admission: RE | Admit: 2015-10-08 | Discharge: 2015-10-08 | Disposition: A | Discharge status: Home / Self Care | Payer: 59 | Source: Ambulatory Visit | Attending: Obstetrics and Gynecology | Admitting: Obstetrics and Gynecology

## 2015-10-08 ENCOUNTER — Encounter (HOSPITAL_BASED_OUTPATIENT_CLINIC_OR_DEPARTMENT_OTHER): Payer: Self-pay | Admitting: *Deleted

## 2015-10-08 DIAGNOSIS — A630 Anogenital (venereal) warts: Secondary | ICD-10-CM | POA: Insufficient documentation

## 2015-10-08 DIAGNOSIS — Z87891 Personal history of nicotine dependence: Secondary | ICD-10-CM | POA: Insufficient documentation

## 2015-10-08 DIAGNOSIS — Z7984 Long term (current) use of oral hypoglycemic drugs: Secondary | ICD-10-CM | POA: Diagnosis not present

## 2015-10-08 DIAGNOSIS — Z79899 Other long term (current) drug therapy: Secondary | ICD-10-CM | POA: Diagnosis not present

## 2015-10-08 DIAGNOSIS — E119 Type 2 diabetes mellitus without complications: Secondary | ICD-10-CM | POA: Diagnosis not present

## 2015-10-08 DIAGNOSIS — Z6829 Body mass index (BMI) 29.0-29.9, adult: Secondary | ICD-10-CM | POA: Insufficient documentation

## 2015-10-08 DIAGNOSIS — E669 Obesity, unspecified: Secondary | ICD-10-CM | POA: Insufficient documentation

## 2015-10-08 DIAGNOSIS — I10 Essential (primary) hypertension: Secondary | ICD-10-CM | POA: Diagnosis not present

## 2015-10-08 DIAGNOSIS — K219 Gastro-esophageal reflux disease without esophagitis: Secondary | ICD-10-CM | POA: Insufficient documentation

## 2015-10-08 HISTORY — DX: Personal history of other diseases of the circulatory system: Z86.79

## 2015-10-08 HISTORY — DX: Type 2 diabetes mellitus without complications: E11.9

## 2015-10-08 HISTORY — PX: LASER ABLATION CONDOLAMATA: SHX5941

## 2015-10-08 HISTORY — DX: Anogenital (venereal) warts: A63.0

## 2015-10-08 HISTORY — DX: Herpesviral infection, unspecified: B00.9

## 2015-10-08 HISTORY — DX: Gastro-esophageal reflux disease without esophagitis: K21.9

## 2015-10-08 LAB — POCT I-STAT 4, (NA,K, GLUC, HGB,HCT)
Glucose, Bld: 232 mg/dL — ABNORMAL HIGH (ref 65–99)
HEMATOCRIT: 50 % — AB (ref 36.0–46.0)
Hemoglobin: 17 g/dL — ABNORMAL HIGH (ref 12.0–15.0)
Potassium: 4 mmol/L (ref 3.5–5.1)
SODIUM: 136 mmol/L (ref 135–145)

## 2015-10-08 LAB — POCT PREGNANCY, URINE: Preg Test, Ur: NEGATIVE

## 2015-10-08 LAB — GLUCOSE, CAPILLARY: Glucose-Capillary: 184 mg/dL — ABNORMAL HIGH (ref 65–99)

## 2015-10-08 SURGERY — ABLATION, CONDYLOMA, USING LASER
Anesthesia: General | Site: Vulva

## 2015-10-08 MED ORDER — METOCLOPRAMIDE HCL 5 MG/ML IJ SOLN
INTRAMUSCULAR | Status: DC | PRN
  Administered 2015-10-08: 10 mg via INTRAVENOUS

## 2015-10-08 MED ORDER — FENTANYL CITRATE (PF) 100 MCG/2ML IJ SOLN
25.0000 ug | INTRAMUSCULAR | Status: DC | PRN
  Filled 2015-10-08: qty 1

## 2015-10-08 MED ORDER — MIDAZOLAM HCL 2 MG/2ML IJ SOLN
INTRAMUSCULAR | Status: AC
  Filled 2015-10-08: qty 4

## 2015-10-08 MED ORDER — ONDANSETRON HCL 4 MG/2ML IJ SOLN
INTRAMUSCULAR | Status: DC | PRN
  Administered 2015-10-08: 4 mg via INTRAVENOUS

## 2015-10-08 MED ORDER — FENTANYL CITRATE (PF) 100 MCG/2ML IJ SOLN
INTRAMUSCULAR | Status: DC | PRN
  Administered 2015-10-08 (×3): 12.5 ug via INTRAVENOUS
  Administered 2015-10-08: 25 ug via INTRAVENOUS
  Administered 2015-10-08 (×3): 12.5 ug via INTRAVENOUS

## 2015-10-08 MED ORDER — LIDOCAINE HCL (CARDIAC) 20 MG/ML IV SOLN
INTRAVENOUS | Status: DC | PRN
  Administered 2015-10-08: 80 mg via INTRAVENOUS

## 2015-10-08 MED ORDER — MIDAZOLAM HCL 5 MG/5ML IJ SOLN
INTRAMUSCULAR | Status: DC | PRN
  Administered 2015-10-08: 2 mg via INTRAVENOUS

## 2015-10-08 MED ORDER — DEXTROSE 5 % IV SOLN
2.0000 g | INTRAVENOUS | Status: AC
  Administered 2015-10-08: 2 g via INTRAVENOUS
  Filled 2015-10-08: qty 2

## 2015-10-08 MED ORDER — KETOROLAC TROMETHAMINE 30 MG/ML IJ SOLN
INTRAMUSCULAR | Status: DC | PRN
  Administered 2015-10-08: 30 mg via INTRAVENOUS

## 2015-10-08 MED ORDER — ACETIC ACID 5 % SOLN
Status: DC | PRN
  Administered 2015-10-08: 1 via TOPICAL

## 2015-10-08 MED ORDER — PROPOFOL 10 MG/ML IV BOLUS
INTRAVENOUS | Status: DC | PRN
  Administered 2015-10-08: 160 mg via INTRAVENOUS

## 2015-10-08 MED ORDER — LACTATED RINGERS IV SOLN
INTRAVENOUS | Status: DC
  Administered 2015-10-08: 07:00:00 via INTRAVENOUS
  Filled 2015-10-08: qty 1000

## 2015-10-08 MED ORDER — PROMETHAZINE HCL 25 MG/ML IJ SOLN
6.2500 mg | INTRAMUSCULAR | Status: DC | PRN
  Filled 2015-10-08: qty 1

## 2015-10-08 MED ORDER — SILVER SULFADIAZINE 1 % EX CREA
TOPICAL_CREAM | CUTANEOUS | Status: DC | PRN
  Administered 2015-10-08: 1 via TOPICAL

## 2015-10-08 MED ORDER — DEXAMETHASONE SODIUM PHOSPHATE 10 MG/ML IJ SOLN
INTRAMUSCULAR | Status: DC | PRN
  Administered 2015-10-08: 10 mg via INTRAVENOUS

## 2015-10-08 MED ORDER — FENTANYL CITRATE (PF) 100 MCG/2ML IJ SOLN
INTRAMUSCULAR | Status: AC
  Filled 2015-10-08: qty 4

## 2015-10-08 MED ORDER — FLUCONAZOLE 150 MG PO TABS: ORAL_TABLET | ORAL | Status: DC

## 2015-10-08 MED ORDER — OXYCODONE-ACETAMINOPHEN 5-325 MG PO TABS: 1.0000 | ORAL_TABLET | Freq: Four times a day (QID) | ORAL | Status: DC | PRN

## 2015-10-08 MED ORDER — CEFOTETAN DISODIUM-DEXTROSE 2-2.08 GM-% IV SOLR
INTRAVENOUS | Status: AC
  Filled 2015-10-08: qty 50

## 2015-10-08 MED ORDER — LIDOCAINE 5 % EX OINT: 1.0000 "application " | TOPICAL_OINTMENT | Freq: Three times a day (TID) | CUTANEOUS | Status: DC | PRN

## 2015-10-08 SURGICAL SUPPLY — 26 items
BLADE SURG 15 STRL LF DISP TIS (BLADE) ×1 IMPLANT
BLADE SURG 15 STRL SS (BLADE) ×1
CATH ROBINSON RED A/P 16FR (CATHETERS) ×2 IMPLANT
COVER BACK TABLE 60X90IN (DRAPES) ×2 IMPLANT
DEPRESSOR TONGUE BLADE STERILE (MISCELLANEOUS) ×2 IMPLANT
DRAPE LG THREE QUARTER DISP (DRAPES) ×2 IMPLANT
ELECT REM PT RETURN 9FT ADLT (ELECTROSURGICAL) ×2
ELECTRODE REM PT RTRN 9FT ADLT (ELECTROSURGICAL) ×1 IMPLANT
GLOVE BIO SURGEON STRL SZ7 (GLOVE) ×4 IMPLANT
GOWN STRL REUS W/ TWL LRG LVL3 (GOWN DISPOSABLE) ×2 IMPLANT
GOWN STRL REUS W/TWL LRG LVL3 (GOWN DISPOSABLE) ×2
KIT ROOM TURNOVER WOR (KITS) ×2 IMPLANT
LEGGING LITHOTOMY PAIR STRL (DRAPES) ×2 IMPLANT
NEEDLE HYPO 25X1 1.5 SAFETY (NEEDLE) ×2 IMPLANT
PACK BASIN DAY SURGERY FS (CUSTOM PROCEDURE TRAY) ×2 IMPLANT
PAD PREP 24X48 CUFFED NSTRL (MISCELLANEOUS) ×2 IMPLANT
PENCIL BUTTON HOLSTER BLD 10FT (ELECTRODE) ×2 IMPLANT
SUT VIC AB 3-0 PS2 18 (SUTURE) ×1
SUT VIC AB 3-0 PS2 18XBRD (SUTURE) ×1 IMPLANT
SYR CONTROL 10ML LL (SYRINGE) ×2 IMPLANT
TOWEL OR 17X24 6PK STRL BLUE (TOWEL DISPOSABLE) ×4 IMPLANT
TRAY DSU PREP LF (CUSTOM PROCEDURE TRAY) ×2 IMPLANT
TUBE CONNECTING 12X1/4 (SUCTIONS) ×2 IMPLANT
VACUUM HOSE 7/8X10 W/ WAND (MISCELLANEOUS) ×2 IMPLANT
WATER STERILE IRR 500ML POUR (IV SOLUTION) ×2 IMPLANT
YANKAUER SUCT BULB TIP NO VENT (SUCTIONS) ×2 IMPLANT

## 2015-10-08 NOTE — Transfer of Care (Signed)
Immediate Anesthesia Transfer of Care Note  Patient: Julia [name]  Procedure(s) Performed: Procedure(s) (LRB): CO2 LASER ABLATION CONDOLAMATA OF VULVA (N/A)  Patient Location: PACU  Anesthesia Type: General  Level of Consciousness: awake, sedated, patient cooperative and responds to stimulation  Airway & Oxygen Therapy: Patient Spontanous Breathing and Patient connected to face mask oxygen  Post-op Assessment: Report given to PACU RN, Post -op Vital signs reviewed and stable and Patient moving all extremities  Post vital signs: Reviewed and stable  Complications: No apparent anesthesia complications

## 2015-10-08 NOTE — Anesthesia Procedure Notes (Signed)
Procedure Name: LMA Insertion Date/Time: 10/08/2015 7:37 AM Performed by: Jessica PriestBEESON, Janalee Grobe C Pre-anesthesia Checklist: Patient identified, Emergency Drugs available, Suction available and Patient being monitored Patient Re-evaluated:Patient Re-evaluated prior to inductionOxygen Delivery Method: Circle System Utilized Preoxygenation: Pre-oxygenation with 100% oxygen Intubation Type: IV induction Ventilation: Mask ventilation without difficulty LMA: LMA inserted LMA Size: 4.0 Number of attempts: 1 Airway Equipment and Method: Bite block Placement Confirmation: positive ETCO2 Tube secured with: Tape Dental Injury: Teeth and Oropharynx as per pre-operative assessment

## 2015-10-08 NOTE — Anesthesia Postprocedure Evaluation (Signed)
  Anesthesia Post-op Note  Patient: Julia [name]  Procedure(s) Performed: Procedure(s) (LRB): CO2 LASER ABLATION CONDOLAMATA OF VULVA (N/A)  Patient Location: PACU  Anesthesia Type: General  Level of Consciousness: awake and alert   Airway and Oxygen Therapy: Patient Spontanous Breathing  Post-op Pain: mild  Post-op Assessment: Post-op Vital signs reviewed, Patient's Cardiovascular Status Stable, Respiratory Function Stable, Patent Airway and No signs of Nausea or vomiting  Last Vitals:  Filed Vitals:   10/08/15 0830  BP: 114/80  Pulse: 82  Temp:   Resp: 14    Post-op Vital Signs: stable   Complications: No apparent anesthesia complications

## 2015-10-08 NOTE — Op Note (Signed)
Preoperative diagnosis: Vulvar condyloma  Postoperative diagnosis: Same  Procedure: Vulvar colposcopy with CO2 laser ablation of vulvar condyloma  Surgeon: Marcelle OverlieHolland  Anesthesia: Gen.  EBL: Less than 5 cc  Procedure and findings:  The patient taken the operating room after an adequate level of general anesthesia was obtained with the legs in stirrups the perineum and vagina were prepped and draped, rinsed with saline bladder was drained. Appropriate timeouts taken at that point.  Magnified vulvar colposcopy was then carried out from above the clitoris down to the perianal area. As noted in the office colposcopy the main focus of the condyloma had been around the periclitoral area. In fact, I could not identify any condyloma below that level through colposcopy.  CO2 laser was set on 20 W continuous with a defocused beam and the visible condyloma were then ablated down to the level of surrounding skin. When no further condyloma were noted the procedure was terminated Silvadene cream was applied. She tolerated this well went to recovery room in good condition.  Dictated with [facility]  Muhammed Teutsch Milana ObeyM Amire Gossen M.D.

## 2015-10-08 NOTE — Discharge Instructions (Addendum)
1.  Warm H2O sitz TID prn, may use blow dryer on low to dry bottom>>then use Sivadene or Xylocaine topical prn  Call your surgeon if you experience:   1.  Fever over 101.0. 2.  Inability to urinate. 3.  Nausea and/or vomiting. 4.  Extreme swelling or bruising at the surgical site. 5.  Continued bleeding from the incision. 6.  Increased pain, redness or drainage from the incision. 7.  Problems related to your pain medication. 8. Any change in color, movement and/or sensation 9. Any problems and/or concerns   Post Anesthesia Home Care Instructions  Activity: Get plenty of rest for the remainder of the day. A responsible adult should stay with you for 24 hours following the procedure.  For the next 24 hours, DO NOT: -Drive a car -Advertising copywriterperate machinery -Drink alcoholic beverages -Take any medication unless instructed by your physician -Make any legal decisions or sign important papers.  Meals: Start with liquid foods such as gelatin or soup. Progress to regular foods as tolerated. Avoid greasy, spicy, heavy foods. If nausea and/or vomiting occur, drink only clear liquids until the nausea and/or vomiting subsides. Call your physician if vomiting continues.  Special Instructions/Symptoms: Your throat may feel dry or sore from the anesthesia or the breathing tube placed in your throat during surgery. If this causes discomfort, gargle with warm salt water. The discomfort should disappear within 24 hours.  If you had a scopolamine patch placed behind your ear for the management of post- operative nausea and/or vomiting:  1. The medication in the patch is effective for 72 hours, after which it should be removed.  Wrap patch in a tissue and discard in the trash. Wash hands thoroughly with soap and water. 2. You may remove the patch earlier than 72 hours if you experience unpleasant side effects which may include dry mouth, dizziness or visual disturbances. 3. Avoid touching the patch. Wash your  hands with soap and water after contact with the patch.

## 2015-10-08 NOTE — Progress Notes (Signed)
The patient was re-examined with no change in status 

## 2015-10-12 ENCOUNTER — Encounter (HOSPITAL_BASED_OUTPATIENT_CLINIC_OR_DEPARTMENT_OTHER): Payer: Self-pay | Admitting: Obstetrics and Gynecology

## 2015-11-12 ENCOUNTER — Ambulatory Visit (INDEPENDENT_AMBULATORY_CARE_PROVIDER_SITE_OTHER): Payer: 59 | Admitting: Cardiovascular Disease

## 2015-11-12 ENCOUNTER — Encounter: Payer: Self-pay | Admitting: Cardiovascular Disease

## 2015-11-12 VITALS — BP 116/98 | HR 96 | Ht 64.0 in | Wt 174.1 lb

## 2015-11-12 DIAGNOSIS — R000 Tachycardia, unspecified: Secondary | ICD-10-CM | POA: Insufficient documentation

## 2015-11-12 MED ORDER — METOPROLOL SUCCINATE ER 25 MG PO TB24: 25.0000 mg | ORAL_TABLET | Freq: Every day | ORAL | Status: DC

## 2015-11-12 NOTE — Patient Instructions (Signed)
Medication Instructions:  START Toprol XL 25 mg once daily   Labwork: None Ordered   Testing/Procedures: None Ordered   Follow-Up: Your physician recommends that you schedule a follow-up appointment in: as needed with Dr. Elease HashimotoNahser.   If you need a refill on your cardiac medications before your next appointment, please call your pharmacy.   Thank you for choosing [facility]! Eligha BridegroomMichelle Erline Siddoway, RN [phone]

## 2015-11-12 NOTE — Progress Notes (Signed)
Julia [name] Bourbeau Date of Birth  13-Mar-1971       [facility]    [place] Office [address], [address]  [address], [address] [place], [place]  [address]   [place], [place]  [id number] [phone]     [phone]   Fax  [phone]     Fax [phone]  Problem List: 1. Supraventricular tachycardia 2. Hypertension  3. Diabetes mellitus  History of Present Illness:  Julia [name] is seen today for follow up of SVT. See her in the past for evaluation of some chest discomfort. She normal stress echocardiogram in 2010. She had normal left ventricular systolic function at that time.  She is here to follow up for an episode of SVT.   It started suddenly while at work.  She had been drinking lots of diet mountain dew.  ( has since cut back). She had stopped her metformin but ha restarted.    She has had lots of palpitations  Nov. 17, 2015:  Araina has continued to have palpitations .  Takes an extra metoprolol on occasion.    Seems to be related to her periods. Glucoses are better.   Still working on diet.    Dec. 9, 2016: Had an SVT ablation in March with Dr. Ladona Ridgelaylor. Metoprolol was stopped at that time .   Marland Kitchen.     Current Outpatient Prescriptions on File Prior to Visit  Medication Sig Dispense Refill  . fluticasone (FLONASE) 50 MCG/ACT nasal spray Place 2 sprays into both nostrils daily.  6  . hydrochlorothiazide (MICROZIDE) 12.5 MG capsule Take 12.5 mg by mouth every morning.    . INVOKANA 100 MG TABS tablet Take 100 mg by mouth daily. Takes in am  1  . lidocaine (XYLOCAINE) 5 % ointment Apply 1 application topically 3 (three) times daily as needed. Use topically TID prn pain relief.. Can alternate with Silvadene cream as needed 35.44 g 0  . metFORMIN (GLUCOPHAGE) 1000 MG tablet Take 1,000 mg by mouth 2 (two) times daily with a meal.    . metoprolol tartrate (LOPRESSOR) 25 MG tablet Take 0.5 tablets (12.5 mg total) by mouth 2 (two) times daily. (Patient taking differently:  Take 25 mg by mouth every morning. ) 90 tablet 3   No current facility-administered medications on file prior to visit.    No Known Allergies  Past Medical History  Diagnosis Date  . HTN (hypertension)   . Hyperlipidemia   . History of supraventricular tachycardia   . Type 2 diabetes mellitus (HCC)   . Condyloma acuminatum of vulva   . HSV-1 infection   . HSV-2 infection   . Complication of anesthesia     I had a reaction to a epidural "broke out"  . GERD (gastroesophageal reflux disease)     Past Surgical History  Procedure Laterality Date  . Cesarean section  09-06-2005  . Supraventricular tachycardia ablation N/A 02/24/2015    Procedure: SUPRAVENTRICULAR TACHYCARDIA ABLATION;  Surgeon: Marinus MawGregg W Taylor, MD;  [facility];  Service: Cardiovascular;  Laterality: N/A;  . Cardiovascular stress test  06-04-2007    normal nuclear study/  no ischemia/  normal LVF, ef 63%  . Transthoracic echocardiogram  01-06-2009    normal LVF, ef 55-60%,  trace MR and TR/  normal stress echo  . Laser ablation condolamata N/A 10/08/2015    Procedure: CO2 LASER ABLATION CONDOLAMATA OF VULVA;  Surgeon: Richarda Overlieichard Holland, MD;  [facility];  Service: Gynecology;  Laterality: N/A;    History  Smoking status  . Former Smoker -- 8 years  . Types: Cigarettes  . Quit date: 12/16/2002  Smokeless tobacco  . Never Used    History  Alcohol Use  . 1.0 oz/week  . 2 Standard drinks or equivalent per week    Comment: rare    Family History  Problem Relation Age of Onset  . Hypertension Mother   . Thyroid disease Mother   . Diabetes Mother   . Thyroid disease Sister   . Diabetes Maternal Grandmother   . Hyperlipidemia Other   . Thyroid disease Other     Reviw of Systems:  Reviewed in the HPI.  All other systems are negative.  Physical Exam: Blood pressure 116/98, pulse 96, height  (1.626 m), weight 174 lb 1.9 oz (78.98 kg), SpO2 97 %. Wt Readings from Last  3 Encounters:  11/12/15 174 lb 1.9 oz (78.98 kg)  10/08/15 171 lb (77.565 kg)  04/02/15 184 lb (83.462 kg)     General: Well developed, well nourished, in no acute distress.  Head: Normocephalic, atraumatic, sclera non-icteric, mucus membranes are moist,   Neck: Supple. Carotids are 2 + without bruits. No JVD   Lungs: Clear   Heart: RR, normla s1S2  Abdomen: Soft, non-tender, non-distended with normal bowel sounds.  Msk:  Strength and tone are normal   Extremities: No clubbing or cyanosis. No edema.  Distal pedal pulses are 2+ and equal    Neuro: CN II - XII intact.  Alert and oriented X 3.   Psych:  Normal   ECG: In the ER .  Narrow complex tachycardia ( SVT) at a rate of 220 ECG #2 - sinus tach at 115.   Assessment / Plan:   1. Supraventricular tachycardia- status post ablation She now has benign sinus tachycardia. We'll start her on Toprol-XL 25 mg a day. I'll see her on an as-needed basis if her primary medical doctor will assume responsibility of refilling the Toprol. If not, I'll see her once a year.  2. Hypertension - continue current meds   3. Diabetes mellitus - encouraged weight loss and exercise     Nahser, Deloris Ping, MD  11/12/2015 3:20 PM    [facility] [address] [place],  [address] [place], [place]  [address] Pager [phone] Phone: [phone]; Fax: [phone]   [facility]  [address] [place], [place]  [address] [phone]   Fax [phone]

## 2015-12-02 ENCOUNTER — Telehealth: Payer: Self-pay | Admitting: Family Medicine

## 2015-12-02 NOTE — Telephone Encounter (Signed)
Left a message for patient to return call to schedule annual exam and follow up on diabetes care.  Will also send a letter to patient.  If we are no longer her pcp please find out who is so that we may update her chart.

## 2016-02-22 ENCOUNTER — Telehealth: Payer: Self-pay | Admitting: Family Medicine

## 2016-02-22 NOTE — Telephone Encounter (Signed)
Called patient to see if they have had the flu shot.   If they have where and when?  If not ask them to come in and get it.  If they decline please document it in health maintenance. 

## 2016-04-15 ENCOUNTER — Ambulatory Visit (INDEPENDENT_AMBULATORY_CARE_PROVIDER_SITE_OTHER): Payer: 59 | Admitting: Emergency Medicine

## 2016-04-15 VITALS — BP 122/80 | HR 114 | Temp 98.7°F | Resp 18 | Ht 64.0 in | Wt 180.0 lb

## 2016-04-15 DIAGNOSIS — L539 Erythematous condition, unspecified: Secondary | ICD-10-CM

## 2016-04-15 DIAGNOSIS — L568 Other specified acute skin changes due to ultraviolet radiation: Secondary | ICD-10-CM

## 2016-04-15 DIAGNOSIS — Z13220 Encounter for screening for lipoid disorders: Secondary | ICD-10-CM

## 2016-04-15 DIAGNOSIS — E039 Hypothyroidism, unspecified: Secondary | ICD-10-CM

## 2016-04-15 DIAGNOSIS — E1165 Type 2 diabetes mellitus with hyperglycemia: Secondary | ICD-10-CM | POA: Diagnosis not present

## 2016-04-15 DIAGNOSIS — E119 Type 2 diabetes mellitus without complications: Secondary | ICD-10-CM | POA: Diagnosis not present

## 2016-04-15 DIAGNOSIS — S90821A Blister (nonthermal), right foot, initial encounter: Secondary | ICD-10-CM | POA: Diagnosis not present

## 2016-04-15 DIAGNOSIS — IMO0001 Reserved for inherently not codable concepts without codable children: Secondary | ICD-10-CM

## 2016-04-15 LAB — COMPREHENSIVE METABOLIC PANEL
ALK PHOS: 115 U/L (ref 33–115)
ALT: 15 U/L (ref 6–29)
AST: 19 U/L (ref 10–35)
Albumin: 4.3 g/dL (ref 3.6–5.1)
BUN: 14 mg/dL (ref 7–25)
CALCIUM: 9.1 mg/dL (ref 8.6–10.2)
CHLORIDE: 98 mmol/L (ref 98–110)
CO2: 27 mmol/L (ref 20–31)
Creat: 0.59 mg/dL (ref 0.50–1.10)
GLUCOSE: 199 mg/dL — AB (ref 65–99)
POTASSIUM: 3.6 mmol/L (ref 3.5–5.3)
Sodium: 136 mmol/L (ref 135–146)
Total Bilirubin: 0.5 mg/dL (ref 0.2–1.2)
Total Protein: 7.6 g/dL (ref 6.1–8.1)

## 2016-04-15 LAB — POCT CBC
Granulocyte percent: 73.3 %G (ref 37–80)
HEMATOCRIT: 45.3 % (ref 37.7–47.9)
HEMOGLOBIN: 15.9 g/dL (ref 12.2–16.2)
Lymph, poc: 1.5 (ref 0.6–3.4)
MCH: 27.7 pg (ref 27–31.2)
MCHC: 35.1 g/dL (ref 31.8–35.4)
MCV: 78.9 fL — AB (ref 80–97)
MID (CBC): 0.5 (ref 0–0.9)
MPV: 7.2 fL (ref 0–99.8)
POC GRANULOCYTE: 5.4 (ref 2–6.9)
POC LYMPH PERCENT: 20.3 %L (ref 10–50)
POC MID %: 6.4 % (ref 0–12)
Platelet Count, POC: 236 10*3/uL (ref 142–424)
RBC: 5.74 M/uL — AB (ref 4.04–5.48)
RDW, POC: 14.8 %
WBC: 7.4 10*3/uL (ref 4.6–10.2)

## 2016-04-15 LAB — LIPID PANEL
CHOL/HDL RATIO: 8 ratio — AB (ref <=5.0)
Cholesterol: 240 mg/dL — ABNORMAL HIGH (ref 125–200)
HDL: 30 mg/dL — AB (ref >=46)
TRIGLYCERIDES: 798 mg/dL — AB (ref <150)

## 2016-04-15 LAB — POCT SEDIMENTATION RATE: POCT SED RATE: 12 mm/hr (ref 0–22)

## 2016-04-15 LAB — TSH: TSH: 5.11 m[IU]/L — AB

## 2016-04-15 LAB — POCT GLYCOSYLATED HEMOGLOBIN (HGB A1C): HEMOGLOBIN A1C: 12

## 2016-04-15 LAB — GLUCOSE, POCT (MANUAL RESULT ENTRY): POC GLUCOSE: 179 mg/dL — AB (ref 70–99)

## 2016-04-15 MED ORDER — RANITIDINE HCL 150 MG PO TABS: 150.0000 mg | ORAL_TABLET | Freq: Two times a day (BID) | ORAL | Status: DC

## 2016-04-15 MED ORDER — METFORMIN HCL 1000 MG PO TABS: 1000.0000 mg | ORAL_TABLET | Freq: Two times a day (BID) | ORAL | Status: DC

## 2016-04-15 MED ORDER — CETIRIZINE HCL 10 MG PO TABS: 10.0000 mg | ORAL_TABLET | Freq: Every day | ORAL | Status: DC

## 2016-04-15 MED ORDER — GLUCOSE BLOOD VI STRP: ORAL_STRIP | Status: DC

## 2016-04-15 NOTE — Progress Notes (Signed)
[facility] [address], [place] [address] [phone]  Date:  04/15/2016   Name:  Julia [name]   DOB:  1971/02/02   MRN:  [id number]  PCP:  Norberto SorensonSHAW,EVA, MD    Chief Complaint: Diabetes and ulcer on foot   History of Present Illness:  This is a 45 y.o. female with PMH SVT s/p ablation, DM2, HTN who is presenting for follow up diabetes. Last seen here for her diabetes 1.5 years ago. States she started running out of her meds 5 months ago and has been rationing them and taking inconsistently since that time. She ran out metformin 2 weeks ago and ran out of invokana 1 week ago. She states "I'm bad at diabetes". When asked why she hasn't returned in so long she states "I don't want to face the music". She doesn't check her sugars. It has been some time since she saw an eye doctor. Last A1C 1.5 years ago was 11.7.   Pt came in today because of a rash all over her body -- worse on her bilateral feet. Thinks it started about 3 weeks ago when she was at the beach. States she looked down at her feet and saw that they were red but did not think much of it. She has realized over the past week or so that her whole body is generally red. She has begun to feel like she is sunburnt. She feels hot and has a mild stinging on her face, hands and feet. She went to [place] this past week for a conference. States she was inside the whole time. She noticed on this trip that she started to develop a blister on her right foot, mild pain at the site. She has continued to take metoprolol and hydrochlorithiazide regularly. No new meds. She has gotten a new scented lotion around the same time these symptoms started -- she uses on her whole body. She has also started to use a new detergent. She denies fever, chills, malaise, mouth lesions, vaginal lesions.  She has a hx of hypothyroidism but states she was able to come off her synthroid a few years ago. She also has a history of psoriasis. She has  a dermatologist -- Dr. Terri PiedraLupton. She has a family history of lupus.  Review of Systems:  Review of Systems See HPI   Patient Active Problem List   Diagnosis Date Noted  . Sinus tachycardia (HCC) 11/12/2015  . S/P radiofrequency ablation operation for arrhythmia 02/24/2015  . SVT (supraventricular tachycardia) (HCC) 08/13/2014  . Palpitations 08/13/2014  . Obesity, Class I, BMI 30-34.9 09/30/2013  . Diabetes mellitus (HCC) 08/01/2012  . HTN (hypertension) 08/01/2012    Prior to Admission medications   Medication Sig Start Date End Date Taking? Authorizing Provider  fluticasone (FLONASE) 50 MCG/ACT nasal spray Place 2 sprays into both nostrils daily. 03/12/15  Yes Historical Provider, MD  hydrochlorothiazide (MICROZIDE) 12.5 MG capsule Take 12.5 mg by mouth every morning.   Yes Historical Provider, MD  INVOKANA 100 MG TABS tablet Take 100 mg by mouth daily. Takes in am 01/14/15  Yes Historical Provider, MD  metFORMIN (GLUCOPHAGE) 1000 MG tablet Take 1,000 mg by mouth 2 (two) times daily with a meal.   Yes Historical Provider, MD  metoprolol succinate (TOPROL-XL) 25 MG 24 hr tablet Take 1 tablet (25 mg total) by mouth daily. 11/12/15  Yes Vesta MixerPhilip J Nahser, MD  lidocaine (XYLOCAINE) 5 % ointment Apply 1 application topically 3 (three) times  daily as needed. Use topically TID prn pain relief.. Can alternate with Silvadene cream as needed Patient not taking: Reported on 04/15/2016 10/08/15   Richarda Overlie, MD    No Known Allergies  Past Surgical History  Procedure Laterality Date  . Cesarean section  09-06-2005  . Supraventricular tachycardia ablation N/A 02/24/2015    Procedure: SUPRAVENTRICULAR TACHYCARDIA ABLATION;  Surgeon: Marinus Maw, MD;  Location: [facility] CATH LAB;  Service: Cardiovascular;  Laterality: N/A;  . Cardiovascular stress test  06-04-2007    normal nuclear study/  no ischemia/  normal LVF, ef 63%  . Transthoracic echocardiogram  01-06-2009    normal LVF, ef 55-60%,  trace MR  and TR/  normal stress echo  . Laser ablation condolamata N/A 10/08/2015    Procedure: CO2 LASER ABLATION CONDOLAMATA OF VULVA;  Surgeon: Richarda Overlie, MD;  Location: [facility];  Service: Gynecology;  Laterality: N/A;    Social History  Substance Use Topics  . Smoking status: Former Smoker -- 8 years    Types: Cigarettes    Quit date: 12/16/2002  . Smokeless tobacco: Never Used  . Alcohol Use: 1.0 oz/week    2 Standard drinks or equivalent per week     Comment: rare    Family History  Problem Relation Age of Onset  . Hypertension Mother   . Thyroid disease Mother   . Diabetes Mother   . Thyroid disease Sister   . Diabetes Maternal Grandmother   . Hyperlipidemia Other   . Thyroid disease Other     Medication list has been reviewed and updated.  Physical Examination:  Physical Exam  Constitutional: She is oriented to person, place, and time. She appears well-developed and well-nourished. No distress.  HENT:  Head: Normocephalic and atraumatic.  Right Ear: Hearing normal.  Left Ear: Hearing normal.  Nose: Nose normal.  Mouth/Throat: Uvula is midline, oropharynx is clear and moist and mucous membranes are normal.  No oral lesions  Eyes: Conjunctivae and lids are normal. Right eye exhibits no discharge. Left eye exhibits no discharge. No scleral icterus.  Neck: Carotid bruit is not present. No thyromegaly present.  Cardiovascular: Normal rate, regular rhythm, normal heart sounds and normal pulses.   No murmur heard. Pulmonary/Chest: Effort normal. No respiratory distress. She has no wheezes. She has no rhonchi. She has no rales.  Musculoskeletal: Normal range of motion.  Lymphadenopathy:       Head (right side): No submental, no submandibular and no tonsillar adenopathy present.       Head (left side): No submental, no submandibular and no tonsillar adenopathy present.    She has no cervical adenopathy.  Neurological: She is alert and oriented to  person, place, and time.  Diabetic Foot Exam - Simple   Visual Inspection  No deformities, no ulcerations, no other skin breakdown bilaterally:  Yes  Sensation Testing  Intact to touch and monofilament testing bilaterally:  Yes  Pulse Check  Posterior Tibialis and Dorsalis pulse intact bilaterally:  Yes  Skin: Skin is warm, dry and intact.  General skin erythema diffusely. Dorsal feet with most erythema. Large 2 cm fluid filled blister on right dorsal foot.  Psychiatric: She has a normal mood and affect. Her speech is normal and behavior is normal. Thought content normal.    BP 122/80 mmHg  Pulse 114  Temp(Src) 98.7 F (37.1 C) (Oral)  Resp 18  Ht  (1.626 m)  Wt 180 lb (81.647 kg)  BMI 30.88 kg/m2  SpO2  97%  LMP 03/29/2016  Results for orders placed or performed in visit on 04/15/16  POCT CBC  Result Value Ref Range   WBC 7.4 4.6 - 10.2 K/uL   Lymph, poc 1.5 0.6 - 3.4   POC LYMPH PERCENT 20.3 10 - 50 %L   MID (cbc) 0.5 0 - 0.9   POC MID % 6.4 0 - 12 %M   POC Granulocyte 5.4 2 - 6.9   Granulocyte percent 73.3 37 - 80 %G   RBC 5.74 (A) 4.04 - 5.48 M/uL   Hemoglobin 15.9 12.2 - 16.2 g/dL   HCT, POC 40.9 81.1 - 47.9 %   MCV 78.9 (A) 80 - 97 fL   MCH, POC 27.7 27 - 31.2 pg   MCHC 35.1 31.8 - 35.4 g/dL   RDW, POC 91.4 %   Platelet Count, POC 236 142 - 424 K/uL   MPV 7.2 0 - 99.8 fL  POCT glucose (manual entry)  Result Value Ref Range   POC Glucose 179 (A) 70 - 99 mg/dl  POCT glycosylated hemoglobin (Hb A1C)  Result Value Ref Range   Hemoglobin A1C 12.0    Assessment and Plan:  1. Uncontrolled type 2 diabetes mellitus without complication, without long-term current use of insulin (HCC) 2. Encounter for diabetic foot exam Uncontrolled. A1C 12.0. She has been taking invokana and metformin inconsistently. She will stop invokana since could be contributing to rash. She will continue metformin. Will not add anything else for now until rash has improved. She will start  checking sugars daily and keep a record. She will return in 2 weeks once rash resolved to follow up on diabetes and determine a better medication regimen for her. - POCT glucose (manual entry) - POCT glycosylated hemoglobin (Hb A1C) - Comprehensive metabolic panel - metFORMIN (GLUCOPHAGE) 1000 MG tablet; Take 1 tablet (1,000 mg total) by mouth 2 (two) times daily with a meal.  Dispense: 60 tablet; Refill: 5  3. Hypothyroidism, unspecified hypothyroidism type - TSH  4. Skin erythema 5. Photosensitivity dermatitis 6. Blister of foot Possible drug reaction vs autoimmune. Blister on foot. No blisters in vaginal area or in mouth. She will stop invokana and hydrochlorithiazide since both could be offending agents. She stop using new lotion and detergent. Start zantac and zyrtec. Will call Dr. Dorita Sciara office to see if can get her worked into his schedule this coming Monday 5/15. We discussed if rash worsens, develops more blisters, develops lesions in mouth or vaginal region, go to ED asap. - POCT CBC - POCT SEDIMENTATION RATE - ranitidine (ZANTAC) 150 MG tablet; Take 1 tablet (150 mg total) by mouth 2 (two) times daily.  Dispense: 60 tablet; Refill: 0 - cetirizine (ZYRTEC ALLERGY) 10 MG tablet; Take 1 tablet (10 mg total) by mouth daily.  Dispense: 30 tablet; Refill: 0 - Wound culture  7. Lipid screening - Lipid panel   Discussed case with Dr. Cleta Alberts.   [place] Miners Dyke Brackett, MHS [facility]  04/15/2016

## 2016-04-15 NOTE — Patient Instructions (Addendum)
Stop hydrochlorithiazide. Check BP at home and keep a record Stop invokana Start checking your sugars daily -- keep a record of this as well.  Take zantac twice a day Take zyrtec once a day Drink lots of water, at least 64 oz water a day Stop lotion and go back to old detergent I will call Dr. Terri PiedraLupton and try to get you in on Monday  If you get lesions in your mouth, eyes, vagina -- go to the ED    IF you received an x-ray today, you will receive an invoice from [facility]. Please contact [facility] at [phone] with questions or concerns regarding your invoice.   IF you received labwork today, you will receive an invoice from [facility]. Please contact [facility] at [phone] with questions or concerns regarding your invoice.   Our billing staff will not be able to assist you with questions regarding bills from these companies.  You will be contacted with the lab results as soon as they are available. The fastest way to get your results is to activate your My Chart account. Instructions are located on the last page of this paperwork. If you have not heard from us regarding the results in 2 weeks, please contact this office.

## 2016-04-17 ENCOUNTER — Encounter: Payer: Self-pay | Admitting: Family Medicine

## 2016-04-17 LAB — WOUND CULTURE
Gram Stain: NONE SEEN
ORGANISM ID, BACTERIA: NO GROWTH

## 2016-04-20 ENCOUNTER — Ambulatory Visit (INDEPENDENT_AMBULATORY_CARE_PROVIDER_SITE_OTHER): Payer: 59 | Admitting: Family Medicine

## 2016-04-20 ENCOUNTER — Encounter (HOSPITAL_COMMUNITY): Payer: Self-pay | Admitting: *Deleted

## 2016-04-20 ENCOUNTER — Emergency Department (HOSPITAL_COMMUNITY)
Admission: EM | Admit: 2016-04-20 | Discharge: 2016-04-20 | Disposition: A | Discharge status: Home / Self Care | Payer: 59 | Attending: Emergency Medicine | Admitting: Emergency Medicine

## 2016-04-20 VITALS — BP 122/88 | HR 94 | Temp 98.5°F | Resp 16 | Ht 64.0 in | Wt 186.2 lb

## 2016-04-20 DIAGNOSIS — L568 Other specified acute skin changes due to ultraviolet radiation: Secondary | ICD-10-CM | POA: Diagnosis not present

## 2016-04-20 DIAGNOSIS — I10 Essential (primary) hypertension: Secondary | ICD-10-CM | POA: Diagnosis not present

## 2016-04-20 DIAGNOSIS — Z7951 Long term (current) use of inhaled steroids: Secondary | ICD-10-CM | POA: Diagnosis not present

## 2016-04-20 DIAGNOSIS — Z8619 Personal history of other infectious and parasitic diseases: Secondary | ICD-10-CM | POA: Diagnosis not present

## 2016-04-20 DIAGNOSIS — Z87891 Personal history of nicotine dependence: Secondary | ICD-10-CM | POA: Insufficient documentation

## 2016-04-20 DIAGNOSIS — S90821D Blister (nonthermal), right foot, subsequent encounter: Secondary | ICD-10-CM | POA: Diagnosis not present

## 2016-04-20 DIAGNOSIS — Z3202 Encounter for pregnancy test, result negative: Secondary | ICD-10-CM | POA: Insufficient documentation

## 2016-04-20 DIAGNOSIS — L564 Polymorphous light eruption: Secondary | ICD-10-CM | POA: Insufficient documentation

## 2016-04-20 DIAGNOSIS — X32XXXA Exposure to sunlight, initial encounter: Secondary | ICD-10-CM | POA: Insufficient documentation

## 2016-04-20 DIAGNOSIS — E119 Type 2 diabetes mellitus without complications: Secondary | ICD-10-CM | POA: Insufficient documentation

## 2016-04-20 DIAGNOSIS — Z8719 Personal history of other diseases of the digestive system: Secondary | ICD-10-CM | POA: Diagnosis not present

## 2016-04-20 DIAGNOSIS — Z79899 Other long term (current) drug therapy: Secondary | ICD-10-CM | POA: Diagnosis not present

## 2016-04-20 DIAGNOSIS — F419 Anxiety disorder, unspecified: Secondary | ICD-10-CM | POA: Insufficient documentation

## 2016-04-20 DIAGNOSIS — E781 Pure hyperglyceridemia: Secondary | ICD-10-CM

## 2016-04-20 DIAGNOSIS — R609 Edema, unspecified: Secondary | ICD-10-CM

## 2016-04-20 DIAGNOSIS — E1165 Type 2 diabetes mellitus with hyperglycemia: Secondary | ICD-10-CM

## 2016-04-20 DIAGNOSIS — R21 Rash and other nonspecific skin eruption: Secondary | ICD-10-CM | POA: Diagnosis present

## 2016-04-20 DIAGNOSIS — IMO0001 Reserved for inherently not codable concepts without codable children: Secondary | ICD-10-CM

## 2016-04-20 LAB — COMPREHENSIVE METABOLIC PANEL
ALT: 23 U/L (ref 14–54)
ANION GAP: 11 (ref 5–15)
AST: 26 U/L (ref 15–41)
Albumin: 3.9 g/dL (ref 3.5–5.0)
Alkaline Phosphatase: 129 U/L — ABNORMAL HIGH (ref 38–126)
BUN: 11 mg/dL (ref 6–20)
CALCIUM: 9.5 mg/dL (ref 8.9–10.3)
CO2: 24 mmol/L (ref 22–32)
Chloride: 97 mmol/L — ABNORMAL LOW (ref 101–111)
Creatinine, Ser: 0.73 mg/dL (ref 0.44–1.00)
GFR calc non Af Amer: >60 mL/min (ref >60)
Glucose, Bld: 488 mg/dL — ABNORMAL HIGH (ref 65–99)
Potassium: 4.5 mmol/L (ref 3.5–5.1)
SODIUM: 132 mmol/L — AB (ref 135–145)
TOTAL PROTEIN: 7.5 g/dL (ref 6.5–8.1)
Total Bilirubin: 0.6 mg/dL (ref 0.3–1.2)

## 2016-04-20 LAB — CBC WITH DIFFERENTIAL/PLATELET
Basophils Absolute: 0 10*3/uL (ref 0.0–0.1)
Basophils Relative: 1 %
EOS ABS: 0 10*3/uL (ref 0.0–0.7)
EOS PCT: 0 %
HCT: 44.6 % (ref 36.0–46.0)
Hemoglobin: 15.1 g/dL — ABNORMAL HIGH (ref 12.0–15.0)
LYMPHS ABS: 1.1 10*3/uL (ref 0.7–4.0)
Lymphocytes Relative: 14 %
MCH: 26.3 pg (ref 26.0–34.0)
MCHC: 33.9 g/dL (ref 30.0–36.0)
MCV: 77.6 fL — ABNORMAL LOW (ref 78.0–100.0)
MONOS PCT: 3 %
Monocytes Absolute: 0.3 10*3/uL (ref 0.1–1.0)
Neutro Abs: 6.4 10*3/uL (ref 1.7–7.7)
Neutrophils Relative %: 82 %
PLATELETS: 261 10*3/uL (ref 150–400)
RBC: 5.75 MIL/uL — ABNORMAL HIGH (ref 3.87–5.11)
RDW: 14.1 % (ref 11.5–15.5)
WBC: 7.8 10*3/uL (ref 4.0–10.5)

## 2016-04-20 LAB — URINE MICROSCOPIC-ADD ON

## 2016-04-20 LAB — URINALYSIS, ROUTINE W REFLEX MICROSCOPIC
Bilirubin Urine: NEGATIVE
Glucose, UA: >1000 mg/dL — AB
Hgb urine dipstick: NEGATIVE
KETONES UR: 15 mg/dL — AB
LEUKOCYTES UA: NEGATIVE
NITRITE: NEGATIVE
PROTEIN: NEGATIVE mg/dL
Specific Gravity, Urine: 1.02 (ref 1.005–1.030)
pH: 6 (ref 5.0–8.0)

## 2016-04-20 MED ORDER — MUPIROCIN 2 % EX OINT: 1.0000 "application " | TOPICAL_OINTMENT | Freq: Three times a day (TID) | CUTANEOUS | Status: DC

## 2016-04-20 MED ORDER — DEXAMETHASONE SODIUM PHOSPHATE 10 MG/ML IJ SOLN
10.0000 mg | Freq: Once | INTRAMUSCULAR | Status: AC
  Administered 2016-04-20: 10 mg via INTRAMUSCULAR
  Filled 2016-04-20: qty 1

## 2016-04-20 MED ORDER — FUROSEMIDE 20 MG PO TABS
20.0000 mg | ORAL_TABLET | Freq: Every day | ORAL | Status: DC
Start: 2016-04-20 — End: 2016-05-16

## 2016-04-20 MED ORDER — DEXAMETHASONE SODIUM PHOSPHATE 10 MG/ML IJ SOLN: 10.0000 mg | Freq: Once | INTRAMUSCULAR | Status: DC

## 2016-04-20 NOTE — ED Provider Notes (Signed)
CSN: [id number]     Arrival date & time 04/20/16  1900 History   First MD Initiated Contact with Patient 04/20/16 1918     Chief Complaint  Patient presents with  . Blisters     HPI   Julia [name] is a 45 year old lady with hypertension, type 2 diabetes, and supraventricular tachycardia here with a rash.  Two weeks ago, after she spent a day on the beach, she had numerous red bumps pop up on her bilateral feet. She had several other bumps pop up on her back as well. She's also had a burning sensation in her soles and palms. Today, she had two small ulcers pop up on the mucosal surface of her upper lip and she decided to come to the emergency room. She was prescribed prednisone but took her first dose today. She denies any arthralgias, malar rash, Raynaud's phenomena, pleuritis, neurologic symptoms, fevers, or other complaints.  Past Medical History  Diagnosis Date  . HTN (hypertension)   . Hyperlipidemia   . History of supraventricular tachycardia   . Type 2 diabetes mellitus (HCC)   . Condyloma acuminatum of vulva   . HSV-1 infection   . HSV-2 infection   . Complication of anesthesia     I had a reaction to a epidural "broke out"  . GERD (gastroesophageal reflux disease)    Past Surgical History  Procedure Laterality Date  . Cesarean section  09-06-2005  . Supraventricular tachycardia ablation N/A 02/24/2015    Procedure: SUPRAVENTRICULAR TACHYCARDIA ABLATION;  Surgeon: Marinus Maw, MD;  Location: [facility] CATH LAB;  Service: Cardiovascular;  Laterality: N/A;  . Cardiovascular stress test  06-04-2007    normal nuclear study/  no ischemia/  normal LVF, ef 63%  . Transthoracic echocardiogram  01-06-2009    normal LVF, ef 55-60%,  trace MR and TR/  normal stress echo  . Laser ablation condolamata N/A 10/08/2015    Procedure: CO2 LASER ABLATION CONDOLAMATA OF VULVA;  Surgeon: Richarda Overlie, MD;  [facility];  Service: Gynecology;  Laterality: N/A;   Family  History  Problem Relation Age of Onset  . Hypertension Mother   . Thyroid disease Mother   . Diabetes Mother   . Thyroid disease Sister   . Diabetes Maternal Grandmother   . Hyperlipidemia Other   . Thyroid disease Other    Social History  Substance Use Topics  . Smoking status: Former Smoker -- 8 years    Types: Cigarettes    Quit date: 12/16/2002  . Smokeless tobacco: Never Used  . Alcohol Use: 1.0 oz/week    2 Standard drinks or equivalent per week     Comment: rare   OB History    No data available     Review of Systems  Constitutional: Negative for fever, chills and activity change.  Eyes: Negative for pain and redness.  Respiratory: Negative for shortness of breath.   Cardiovascular: Negative for chest pain.  Gastrointestinal: Negative for abdominal pain.  Endocrine: Negative for cold intolerance and heat intolerance.  Musculoskeletal: Negative for joint swelling and arthralgias.  Skin: Positive for color change and rash.  Neurological: Negative for dizziness and syncope.  Psychiatric/Behavioral: The patient is nervous/anxious.    Allergies  Review of patient's allergies indicates no known allergies.  Home Medications   Prior to Admission medications   Medication Sig Start Date End Date Taking? Authorizing Provider  fluticasone (FLONASE) 50 MCG/ACT nasal spray Place 2 sprays into both nostrils daily.  03/12/15  Yes Historical Provider, MD  glucose blood test strip Use as instructed 04/15/16  Yes Lanier ClamNicole Bush V, PA-C  metFORMIN (GLUCOPHAGE) 1000 MG tablet Take 1 tablet (1,000 mg total) by mouth 2 (two) times daily with a meal. 04/15/16  Yes Lanier ClamNicole Bush V, PA-C  triamcinolone cream (KENALOG) 0.1 % Apply 1 application topically daily as needed. For rash   Yes Historical Provider, MD   BP 155/115 mmHg  Pulse 127  Temp(Src) 98 F (36.7 C) (Oral)  Resp 20  Ht 5\' 4"  (1.626 m)  Wt 84.369 kg  BMI 31.91 kg/m2  SpO2 97%  LMP 03/29/2016 Physical Exam  Constitutional:  She is oriented to person, place, and time. She appears well-developed and well-nourished.  HENT:  Head: Normocephalic and atraumatic.  Eyes: Conjunctivae are normal. Pupils are equal, round, and reactive to light.  Neck: Normal range of motion. Neck supple.  Cardiovascular: Normal rate, regular rhythm, normal heart sounds and intact distal pulses.   Pulmonary/Chest: Effort normal and breath sounds normal.  Abdominal: Soft. Bowel sounds are normal.  Musculoskeletal: Normal range of motion. She exhibits no edema or tenderness.  Neurological: She is alert and oriented to person, place, and time.  Skin:  She has pinpoint red papules on her bilateral lower extremities surrounded my subtle macular erythema.  On her upper inner lip, she has two pinpoint ulcers. There are no other oral lesions.  She has no nailfold telangiectasias nor nail pitting.  Psychiatric: She has a normal mood and affect. Her behavior is normal.    ED Course  Procedures (including critical care time) Labs Review Labs Reviewed  CBC WITH DIFFERENTIAL/PLATELET - Abnormal; Notable for the following:    RBC 5.75 (*)    Hemoglobin 15.1 (*)    MCV 77.6 (*)    All other components within normal limits  COMPREHENSIVE METABOLIC PANEL  URINALYSIS, ROUTINE W REFLEX MICROSCOPIC (NOT AT [facility])  POC URINE PREG, ED    Imaging Review No results found. I have personally reviewed and evaluated these images and lab results as part of my medical decision-making.   EKG Interpretation   Date/Time:  Thursday Apr 20 2016 19:15:20 EDT Ventricular Rate:  121 PR Interval:  154 QRS Duration: 74 QT Interval:  306 QTC Calculation: 434 R Axis:   -10 Text Interpretation:  Sinus tachycardia Low voltage QRS Cannot rule out  Anteroseptal infarct , age undetermined Abnormal ECG Confirmed by BEATON   MD, ROBERT ([id number]) on 04/20/2016 7:31:48 PM      MDM   Final diagnoses:  Polymorphous light eruption   Julia [name] is a 45 year old  lady here with diffuse red papules on her bilateral lower extremities following sun-exposure at the beach two weeks ago. She has two small oral ulcers, but denies arthralgias, malar rash, discoid lesions, fevers, or pleuritis, so I doubt this is SLE. 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 She is already on prednisone 40mg  daily and topical triamcinolone which I believe is completely appropriate. We've given her 10mg  decadron intramuscularly here to hasten her relief. She also complains of burning in her hands and feet; this could be simple diabetic neuropathy but given her subtle polycythemia, erythromelalgia is another consideration. I think a JAK2 mutation would be prudent to screen for myeloproliferative disorder on an outpatient basis.  Selina CooleyKyle Peighton Mehra, MD 04/20/16 [id number]  Nelva Nayobert Beaton, MD 04/22/16 [phone]

## 2016-04-20 NOTE — ED Notes (Signed)
Patient presents stating she has been having a reaction to some meds but her PMD has not been able to figure out what is going on and instructed to come to the ED if she notices blisters in her mouth.  Started on Prednisone and Lasix today heart is racing, BP elevated due to the fact that she had to be taken off all her meds to determine what the reaction is to.  Has blisters on her feet which are swollen and now has blisters in her mouth

## 2016-04-20 NOTE — ED Notes (Addendum)
When pt presented to nurse first she stated "skin reaction and blisters".  Pt was sent by PCP for same. No distress noted steady and smooth gait. A/o x4.

## 2016-04-20 NOTE — Patient Instructions (Addendum)
Take lasix 1-2 times a day, based your swelling and weight. Monitor your weight daily. If you get back down 180 pounds, you may stop the lasix. Take prednisone as directed Continue the zyrtec and zantac bactroban on your blister once it opens Stop the metoprolol If you get blisters in your mouth or vaginal region, go to the emergency room asap Follow up with Dr. Clelia CroftShaw on Saturday    IF you received an x-ray today, you will receive an invoice from [facility]. Please contact [facility] at [phone] with questions or concerns regarding your invoice.   IF you received labwork today, you will receive an invoice from [facility]. Please contact [facility] at [phone] with questions or concerns regarding your invoice.   Our billing staff will not be able to assist you with questions regarding bills from these companies.  You will be contacted with the lab results as soon as they are available. The fastest way to get your results is to activate your My Chart account. Instructions are located on the last page of this paperwork. If you have not heard from us regarding the results in 2 weeks, please contact this office.

## 2016-04-20 NOTE — Progress Notes (Signed)
[facility] [address], [place] [id number] [phone]  Date:  04/20/2016   Name:  Julia [name]   DOB:  March 28, 1971   MRN:  [id number]  PCP:  Norberto SorensonSHAW,EVA, MD    Chief Complaint: Blister   History of Present Illness:  This is a 45 y.o. female with PMH DM2 uncontrolled, SVT, HTN, palps, HLD who is presenting for follow up rash. She was seen here on 5/13 to primarily follow up on her diabetes but also mentioned a rash that had been going on for 3 weeks, which had worsened over the previous week. Rash consisted of general erythema over entire body, a 2 cm blister on her right foot and general burning sensation, esp on hands and face. Rash thought to be d/t drug reaction. Had her stop hctz and invokana. Started on zyrtec, zantac. Referred to her dermatologist, Dr. Terri PiedraLupton. Dr. Terri Piedralupton apparently agreed this seemed to be a photosensitivity rash -- took a biopsy from her back and gave a tapering dose of steroids started at 40 mg and tapering down 5 mg each day for 8 days. She has not started on the prednisone yet -- wanted to come in and be checked first. Rash has not improved at all over the past week and actually seems to be getting a little worse. The blister on her right foot is resolved but has developed a new blister on that foot that she noticed upon waking this AM. She is still having a burning sensation. She now feels that her mouth and chest are burning. No lesions in mouth or vaginal area as far as she knows. She is swelling in her legs and face -- has gained 6 pounds over the past week. She had one day in the past week when she felt things were getting a little better. She also had 1 day in the past week that she forgot to take her metoprolol but isn't sure if these days were the same. She denies fever, palps.  Pt was put on metoprolol 1 year ago for SVT. Since that time she has had an ablation. Her metoprolol has gradually been cut down - she is doing great on 25 mg  metoprolol. occ gets sensation she is having palps but not like when she had SVT.  Last A1C 1 week ago 12.0. Increased metformin to BID. Stopped invokana d/t possibly causing rash. She had been taking metformin and invokana inconsisitently over the past 6 months. Last follow up for DM was 1.5 years ago with Dr. Clelia CroftShaw.  Last visit labs: TSH 5.11 CBC wnl CMP wnl Sed rate nml Wound culture from blister no growth Lipid panel --- marked increased triglycerides to 798 and total cholesterol 240. LDL unable to be calculated. She was fasting for that blood work.  Review of Systems:  Review of Systems See HPI  Patient Active Problem List   Diagnosis Date Noted  . Uncontrolled type 2 diabetes mellitus without complication, without long-term current use of insulin (HCC) 04/15/2016  . Sinus tachycardia (HCC) 11/12/2015  . S/P radiofrequency ablation operation for arrhythmia 02/24/2015  . SVT (supraventricular tachycardia) (HCC) 08/13/2014  . Palpitations 08/13/2014  . HTN (hypertension) 08/01/2012    Prior to Admission medications   Medication Sig Start Date End Date Taking? Authorizing Provider  cetirizine (ZYRTEC ALLERGY) 10 MG tablet Take 1 tablet (10 mg total) by mouth daily. 04/15/16  Yes Lanier ClamNicole Niveah Boerner V, PA-C  fluticasone (FLONASE) 50 MCG/ACT nasal spray Place 2 sprays  into both nostrils daily. 03/12/15  Yes Historical Provider, MD  glucose blood test strip Use as instructed 04/15/16  Yes Lanier Clam V, PA-C  metFORMIN (GLUCOPHAGE) 1000 MG tablet Take 1 tablet (1,000 mg total) by mouth 2 (two) times daily with a meal. 04/15/16  Yes Lanier Clam V, PA-C  metoprolol succinate (TOPROL-XL) 25 MG 24 hr tablet Take 1 tablet (25 mg total) by mouth daily. 11/12/15  Yes Vesta Mixer, MD  ranitidine (ZANTAC) 150 MG tablet Take 1 tablet (150 mg total) by mouth 2 (two) times daily. 04/15/16  Yes Dorna Leitz, PA-C    No Known Allergies  Past Surgical History  Procedure Laterality Date  . Cesarean  section  09-06-2005  . Supraventricular tachycardia ablation N/A 02/24/2015    Procedure: SUPRAVENTRICULAR TACHYCARDIA ABLATION;  Surgeon: Marinus Maw, MD;  Location: [facility] CATH LAB;  Service: Cardiovascular;  Laterality: N/A;  . Cardiovascular stress test  06-04-2007    normal nuclear study/  no ischemia/  normal LVF, ef 63%  . Transthoracic echocardiogram  01-06-2009    normal LVF, ef 55-60%,  trace MR and TR/  normal stress echo  . Laser ablation condolamata N/A 10/08/2015    Procedure: CO2 LASER ABLATION CONDOLAMATA OF VULVA;  Surgeon: Richarda Overlie, MD;  Location: [facility];  Service: Gynecology;  Laterality: N/A;   Social History  Substance Use Topics  . Smoking status: Former Smoker -- 8 years    Types: Cigarettes    Quit date: 12/16/2002  . Smokeless tobacco: Never Used  . Alcohol Use: 1.0 oz/week    2 Standard drinks or equivalent per week     Comment: rare    Family History  Problem Relation Age of Onset  . Hypertension Mother   . Thyroid disease Mother   . Diabetes Mother   . Thyroid disease Sister   . Diabetes Maternal Grandmother   . Hyperlipidemia Other   . Thyroid disease Other     Medication list has been reviewed and updated.  Physical Examination:  Physical Exam  Constitutional: She is oriented to person, place, and time. She appears well-developed and well-nourished. No distress.  HENT:  Head: Normocephalic and atraumatic.  Right Ear: Hearing normal.  Left Ear: Hearing normal.  Nose: Nose normal.  Mouth/Throat: Uvula is midline and mucous membranes are normal. No oral lesions. Posterior oropharyngeal erythema present. No oropharyngeal exudate or posterior oropharyngeal edema.  Eyes: Conjunctivae and lids are normal. Right eye exhibits no discharge. Left eye exhibits no discharge. No scleral icterus.  Cardiovascular: Normal rate, regular rhythm, normal heart sounds and normal pulses.   No murmur heard. Pulmonary/Chest: Effort normal  and breath sounds normal. No respiratory distress. She has no wheezes. She has no rhonchi. She has no rales.  Musculoskeletal: Normal range of motion.  Lymphadenopathy:       Head (right side): No submental, no submandibular and no tonsillar adenopathy present.       Head (left side): No submental, no submandibular and no tonsillar adenopathy present.    She has no cervical adenopathy.  Neurological: She is alert and oriented to person, place, and time.  Skin: Skin is warm, dry and intact.  Still with general erythema over entire body 2 cm blister on right foot now deflated and flush with skin, no evidence infection New 0.5 cm blister right distal later foot No other blisters No oral lesions. Bandage over right lower back from biopsy 2 days ago 1+ edema bilateral LE to  level mid-shin Face and lower eyelids appear puffy  Psychiatric: She has a normal mood and affect. Her speech is normal and behavior is normal. Thought content normal.   BP 122/88 mmHg  Pulse 94  Temp(Src) 98.5 F (36.9 C) (Oral)  Resp 16  Ht 5\' 4"  (1.626 m)  Wt 186 lb 3.2 oz (84.46 kg)  BMI 31.95 kg/m2  SpO2 98%  LMP 03/29/2016  Assessment and Plan:  1. Photosensitivity 2. Blister of foot, right, subsequent encounter 3. Swelling  Continued/mild worsening photosensitivity rash. No better with stopped hctz and invokana. Stop metoprolol. Continue zyrtec and zantac. She will start the prednisone taper which is obviously not ideal with her uncontrolled DM but is necessary at this time. Biopsy performed by Dr. Terri Piedra pending. Pt having some swelling since coming off the hctz and has gained 6 pounds in the past week. Prescribed lasix to take 1-2 times a day over the next few days. She will monitor her weight at home and once back down to normal wt she will stop. She will return here in 2 days to follow up with Dr. Clelia Croft. Go to ED if rash worsens, develops lesions in mouth or vaginal region. - mupirocin ointment (BACTROBAN)  2 %; Apply 1 application topically 3 (three) times daily.  Dispense: 22 g; Refill: 1 - furosemide (LASIX) 20 MG tablet; Take 1 tablet (20 mg total) by mouth daily.  Dispense: 20 tablet; Refill: 0  4. Uncontrolled type 2 diabetes mellitus without complication, without long-term current use of insulin (HCC) Continue metformin 1000 mg BID for now. Will determine better regimen at follow up after rash is resolved.  5. Hypertriglyceridemia Needs statin therapy -- address at follow up after rash resolves.   [place] Brackett, MHS [facility]  04/20/2016

## 2016-04-21 LAB — POC URINE PREG, ED: Preg Test, Ur: NEGATIVE

## 2016-04-22 ENCOUNTER — Ambulatory Visit (INDEPENDENT_AMBULATORY_CARE_PROVIDER_SITE_OTHER): Payer: 59 | Admitting: Family Medicine

## 2016-04-22 VITALS — BP 120/98 | HR 91 | Temp 98.1°F | Resp 16 | Ht 64.25 in | Wt 181.2 lb

## 2016-04-22 DIAGNOSIS — L568 Other specified acute skin changes due to ultraviolet radiation: Secondary | ICD-10-CM

## 2016-04-22 DIAGNOSIS — S90821D Blister (nonthermal), right foot, subsequent encounter: Secondary | ICD-10-CM | POA: Diagnosis not present

## 2016-04-22 DIAGNOSIS — E039 Hypothyroidism, unspecified: Secondary | ICD-10-CM

## 2016-04-22 DIAGNOSIS — IMO0001 Reserved for inherently not codable concepts without codable children: Secondary | ICD-10-CM

## 2016-04-22 DIAGNOSIS — E1165 Type 2 diabetes mellitus with hyperglycemia: Secondary | ICD-10-CM

## 2016-04-22 DIAGNOSIS — E781 Pure hyperglyceridemia: Secondary | ICD-10-CM

## 2016-04-22 MED ORDER — METFORMIN HCL 1000 MG PO TABS: 1000.0000 mg | ORAL_TABLET | Freq: Two times a day (BID) | ORAL | Status: DC

## 2016-04-22 MED ORDER — DULAGLUTIDE 0.75 MG/0.5ML ~~LOC~~ SOAJ: 0.7500 mg | SUBCUTANEOUS | Status: DC

## 2016-04-22 NOTE — Progress Notes (Addendum)
By signing my name below, I, Julia [name], attest that this documentation has been prepared under the direction and in the presence of Norberto Sorenson, MD.  Electronically Signed: Arvilla Market, Medical Scribe. 04/22/2016. 8:24 AM.  Subjective:    Patient ID: Julia [name], female    DOB: 01-22-1971, 45 y.o.   MRN: [id number] invokana   HPI Chief Complaint  Patient presents with  . Follow-up    Rt Foot    HPI Comments: Julia [name] is a 45 y.o. female who [facility] to the [facility] for [facility] follow-up. Presented 1 week prior for uncontrolled DM. She ran out of medication several weeks prior when she failed to come in for recheck because she didn't want to be told her DM was uncontrolled. She also had diffuse whole body rash for 3 weeks; worse bilaterally feet with erythema and spread to hands and face with erythema. Hot and stingy sensation followed by blister on right foot. Her A1c was 12. She was instructed to stop the Invokana and hctz because it could be exacerbating the rash, restart metformin and monitor with close follow-up, change back to original lotions and detergents, start zantac and zyrtec and follow-up with dermatologist Dr. Terri Piedra. CBC and sed rate were nl, wound culture was negative. TSH mildy elevated at 5.1. Lipid panel uncontrolled with LDL unable to be calc due to triglycerides of 800. Pt followed up with [facility] again 2 days prior. Her dermatologist agreed with the diagnosis of photosensitivity rash, did biopsy.  2d prior her rash had continued to worsen and she developed a second blister on her foot. She gained 6 lbs in a week with swelling on legs and face and reports her mouth is burning. She was started on PRN lasix  which she has used twice with good result and started the prednisone taper that was prescribed to her by derm. Later that day after her visit she developed 2 ulcers on her mucosal surface of her upper lip despite starting the prednisone so she went to  the ER as she had been instructed. She was given 10 mg of decadron IM as ER doctor agreed with photosensitivity rash due to either invokana or HCTZ, was instructed to continue to prednisone taper and try topical TAC. Suggested that burning sensation on extremities was due to diabetic neuropathy vs erythronelalgia and suggested screening for JAK2 mutation.  She's doing much better now. She reports having thin blotchy complexion chronically.  Yesterday she had 2-3 episodes of her hands burning and a little this morning, but her burning feet has resolved. During the "burning" episodes, her extremities still feel cool to the touch.  She responded well to just 2 doses of the lasix. The burning episodes do not seem to be connected to activity or temp. She also describes chest burning where it feels like her breath would be very hot.    Her sugar was 306 yesterday. She ate some blueberries and some some other fruit. She's been more thirsty and feels like walking more. Last week she's been sedentary but she's trying "beach body", which is an exercise program that uses the buddy system to hold her accountable and considering taking "shakeology." that's used by people who are apart of beachbody. 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 Her medical problems has given her a wake up call. 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 She mentions her medical problems have been stressing her out.  PMHx rheumatological: Older sister has something similar to this with her fingers going blue, and more pain. She was checked for lupus and it was negative. Aunt has lupus. Mom has RA   Patient Active Problem List   Diagnosis Date Noted  . Uncontrolled type 2 diabetes mellitus without complication, without long-term current use of insulin (HCC) 04/15/2016  . Sinus tachycardia (HCC) 11/12/2015  . S/P radiofrequency ablation  operation for arrhythmia 02/24/2015  . SVT (supraventricular tachycardia) (HCC) 08/13/2014  . Palpitations 08/13/2014  . HTN (hypertension) 08/01/2012   Past Medical History  Diagnosis Date  . HTN (hypertension)   . Hyperlipidemia   . History of supraventricular tachycardia   . Type 2 diabetes mellitus (HCC)   . Condyloma acuminatum of vulva   . HSV-1 infection   . HSV-2 infection   . Complication of anesthesia     I had a reaction to a epidural "broke out"  . GERD (gastroesophageal reflux disease)    Past Surgical History  Procedure Laterality Date  . Cesarean section  09-06-2005  . Supraventricular tachycardia ablation N/A 02/24/2015    Procedure: SUPRAVENTRICULAR TACHYCARDIA ABLATION;  Surgeon: Marinus Maw, MD;  Location: [facility] CATH LAB;  Service: Cardiovascular;  Laterality: N/A;  . Cardiovascular stress test  06-04-2007    normal nuclear study/  no ischemia/  normal LVF, ef 63%  . Transthoracic echocardiogram  01-06-2009    normal LVF, ef 55-60%,  trace MR and TR/  normal stress echo  . Laser ablation condolamata N/A 10/08/2015    Procedure: CO2 LASER ABLATION CONDOLAMATA OF VULVA;  Surgeon: Richarda Overlie, MD;  Location: [facility];  Service: Gynecology;  Laterality: N/A;   No Known Allergies Prior to Admission medications   Medication Sig Start Date End Date Taking? Authorizing Provider  cetirizine (ZYRTEC) 10 MG chewable tablet Chew 10 mg by mouth daily.   Yes Historical Provider, MD  fluticasone (FLONASE) 50 MCG/ACT nasal spray Place 2 sprays into both nostrils daily. 03/12/15  Yes Historical Provider, MD  glucose blood test strip Use as instructed 04/15/16  Yes Lanier Clam V, PA-C  metFORMIN (GLUCOPHAGE) 1000 MG tablet Take 1 tablet (1,000 mg total) by mouth 2 (two) times daily with a meal. 04/15/16  Yes Lanier Clam V, PA-C  ranitidine (ZANTAC) 150 MG tablet Take 150 mg by mouth 2 (two) times daily.   Yes Historical Provider, MD  triamcinolone cream  (KENALOG) 0.1 % Apply 1 application topically daily as needed. For rash   Yes Historical Provider, MD   Social History   Social History  . Marital Status: Married    Spouse Name: N/A  . Number of Children: N/A  . Years of Education: N/A   Occupational History  . Not on file.   Social History Main Topics  . Smoking status: Former Smoker -- 8 years    Types: Cigarettes    Quit date: 12/16/2002  . Smokeless tobacco: Never Used  . Alcohol Use: 1.0 oz/week    2 Standard drinks or equivalent per week     Comment: rare  . Drug Use: No  . Sexual Activity: Yes    Birth Control/ Protection: None   Other Topics Concern  . Not on file   Social History Narrative   Depression screen [facility] 2/9 04/22/2016 04/20/2016 04/15/2016 09/22/2014  Decreased Interest 0 0 0 0  Down, Depressed, Hopeless 0 0 0 0  PHQ - 2 Score 0 0 0 0   Review of Systems  Constitutional: Positive for unexpected weight change. Negative for fever, activity change and fatigue.  HENT: Negative for sore throat and trouble swallowing.   Cardiovascular: Negative for leg swelling.  Gastrointestinal: Negative for nausea, vomiting and diarrhea.  Genitourinary: Negative for frequency, decreased urine volume and genital sores.  Skin: Positive for color change and rash. Negative for pallor and wound.  Neurological: Negative for headaches.  Hematological: Does not bruise/bleed easily.   Objective:  BP 120/98 mmHg  Pulse 91  Temp(Src) 98.1 F (36.7 C) (Oral)  Resp 16  Ht 5' 4.25" (1.632 m)  Wt 181 lb 3.2 oz (82.192 kg)  BMI 30.86 kg/m2  SpO2 98%  LMP 03/29/2016  Physical Exam  Constitutional: She appears well-developed and well-nourished. No distress.  HENT:  Head: Normocephalic and atraumatic.  Eyes: Conjunctivae are normal.  Neck: Neck supple. Thyromegaly present.  Cardiovascular: Normal rate, regular rhythm and normal heart sounds.  Exam reveals no gallop and no friction rub.   No murmur heard. Pulmonary/Chest:  Effort normal and breath sounds normal. No respiratory distress. She has no wheezes. She has no rales.  Musculoskeletal:  2+ pedal pulses Delayed capillary refill, over 5 sec in both hands and feet Persistent erythema over truck with delayed capillary refill  Neurological: She is alert.  Skin: Skin is warm and dry.  Psychiatric: She has a normal mood and affect. Her behavior is normal.  Nursing note and vitals reviewed.  Assessment & Plan:   1. Uncontrolled type 2 diabetes mellitus without complication, without long-term current use of insulin (HCC) - Cont metformin. Suspected that Invokana may have contributed to photosensitivity dermatits so d/c.  Try trulicity. Check cbgs - given glucose log. If still elevated in a mo, call to increase trulicity dose - coupon given.  2. Blister of foot, right, subsequent encounter - resolving well.  3. Photosensitivity dermatitis - resolving, finishing steroid taper with top TAC.  Pathology results from Dr. Dorita SciaraLupton's biopsy still P.  4. Hypothyroidism, unspecified hypothyroidism type - use to be on synthroid but stable off for years - tsh mildly elev at 5 last wk but may be due to acute inflammatory state  5. Hypertriglyceridemia - check flp at f/u in 3 mos    Meds ordered this encounter  Medications  . cetirizine (ZYRTEC) 10 MG chewable tablet    Sig: Chew 10 mg by mouth daily.  . ranitidine (ZANTAC) 150 MG tablet    Sig: Take 150 mg by mouth 2 (two) times daily.  . metFORMIN (GLUCOPHAGE) 1000 MG tablet    Sig: Take 1 tablet (1,000 mg total) by mouth 2 (two) times daily with a meal.    Dispense:  90 tablet    Refill:  3  . Dulaglutide (TRULICITY) 0.75 MG/0.5ML SOPN    Sig: Inject 0.75 mg into the skin once a week.    Dispense:  4 pen    Refill:  3   Over 40 min spent in face-to-face evaluation of and consultation with patient and coordination of care.  Over 50% of this time was spent counseling this patient.  I personally performed the services  described in this documentation, which was scribed in my presence. The recorded information has been reviewed and considered, and addended by me as needed.  Norberto SorensonEva Ellicia Alix, MD MPH

## 2016-04-22 NOTE — Patient Instructions (Addendum)
IF you received an x-ray today, you will receive an invoice from [facility]. Please contact [facility] at [phone] with questions or concerns regarding your invoice.   IF you received labwork today, you will receive an invoice from [employer]. Please contact [facility] at [phone] with questions or concerns regarding your invoice.   Our billing staff will not be able to assist you with questions regarding bills from these companies.  You will be contacted with the lab results as soon as they are available. The fastest way to get your results is to activate your My Chart account. Instructions are located on the last page of this paperwork. If you have not heard from [place] regarding the results in 2 weeks, please contact this office.     Diabetes and Standards of Medical Care Diabetes is complicated. You may find that your diabetes team includes a dietitian, nurse, diabetes educator, eye doctor, and more. To help everyone know what is going on and to help you get the care you deserve, the following schedule of care was developed to help keep you on track. Below are the tests, exams, vaccines, medicines, education, and plans you will need. HbA1c test This test shows how well you have controlled your glucose over the past 2-3 months. It is used to see if your diabetes management plan needs to be adjusted.   It is performed at least 2 times a year if you are meeting treatment goals.  It is performed 4 times a year if therapy has changed or if you are not meeting treatment goals. Blood pressure test  This test is performed at every routine medical visit. The goal is less than 140/90 mm Hg for most people, but 130/80 mm Hg in some cases. Ask your health care provider about your goal. Dental exam  Follow up with the dentist regularly. Eye exam  If you are diagnosed with type 1 diabetes as a child, get an exam upon reaching the age of 58  years or older and having had diabetes for 3-5 years. Yearly eye exams are recommended after that initial eye exam.  If you are diagnosed with type 1 diabetes as an adult, get an exam within 5 years of diagnosis and then yearly.  If you are diagnosed with type 2 diabetes, get an exam as soon as possible after the diagnosis and then yearly. Foot care exam  Visual foot exams are performed at every routine medical visit. The exams check for cuts, injuries, or other problems with the feet.  You should have a complete foot exam performed every year. This exam includes an inspection of the structure and skin of your feet, a check of the pulses in your feet, and a check of the sensation in your feet.  Type 1 diabetes: The first exam is performed 5 years after diagnosis.  Type 2 diabetes: The first exam is performed at the time of diagnosis.  Check your feet nightly for cuts, injuries, or other problems with your feet. Tell your health care provider if anything is not healing. Kidney function test (urine microalbumin)  This test is performed once a year.  Type 1 diabetes: The first test is performed 5 years after diagnosis.  Type 2 diabetes: The first test is performed at the time of diagnosis.  A serum creatinine and estimated glomerular filtration rate (eGFR) test is done once a year to assess the level of chronic kidney disease (CKD), if present. Lipid profile (cholesterol,  HDL, LDL, triglycerides)  Performed every 5 years for most people.  The goal for LDL is less than 100 mg/dL. If you are at high risk, the goal is less than 70 mg/dL.  The goal for HDL is 40 mg/dL-50 mg/dL for men and 50 mg/dL-60 mg/dL for women. An HDL cholesterol of 60 mg/dL or higher gives some protection against heart disease.  The goal for triglycerides is less than 150 mg/dL. Immunizations  The flu (influenza) vaccine is recommended yearly for every person 49 months of age or older who has diabetes.  The  pneumonia (pneumococcal) vaccine is recommended for every person 46 years of age or older who has diabetes. Adults 81 years of age or older may receive the pneumonia vaccine as a series of two separate shots.  The hepatitis B vaccine is recommended for adults shortly after they have been diagnosed with diabetes.  The Tdap (tetanus, diphtheria, and pertussis) vaccine should be given:  According to normal childhood vaccination schedules, for children.  Every 10 years, for adults who have diabetes. Diabetes self-management education  Education is recommended at diagnosis and ongoing as needed. Treatment plan  Your treatment plan is reviewed at every medical visit.   This information is not intended to replace advice given to you by your health care provider. Make sure you discuss any questions you have with your health care provider.   Document Released: 09/17/2009 Document Revised: 12/11/2014 Document Reviewed: 04/22/2013 Elsevier Interactive Patient Education [employer].

## 2016-04-23 MED ORDER — METFORMIN HCL 1000 MG PO TABS
1000.0000 mg | ORAL_TABLET | Freq: Two times a day (BID) | ORAL | Status: DC
Start: 2016-04-23 — End: 2016-10-24

## 2016-04-23 MED ORDER — DULAGLUTIDE 0.75 MG/0.5ML ~~LOC~~ SOAJ: 0.7500 mg | SUBCUTANEOUS | Status: DC

## 2016-04-27 ENCOUNTER — Telehealth: Payer: Self-pay

## 2016-04-27 NOTE — Telephone Encounter (Signed)
PA approved. Notified pharm. 

## 2016-04-27 NOTE — Telephone Encounter (Signed)
Completed PA for trulicity on covermymeds. Pending.

## 2016-04-28 NOTE — Telephone Encounter (Signed)
Approval through 04/28/2019

## 2016-04-29 ENCOUNTER — Other Ambulatory Visit: Payer: Self-pay | Admitting: Physician Assistant

## 2016-05-16 ENCOUNTER — Other Ambulatory Visit: Payer: Self-pay | Admitting: Physician Assistant

## 2016-05-18 NOTE — Telephone Encounter (Signed)
Dr Clelia CroftShaw, these were Rxd for pt last month during an acute issue. You were the last to see pt for this. Do you want to give any RFs on these or have pt RTC?

## 2016-05-31 ENCOUNTER — Other Ambulatory Visit: Payer: Self-pay | Admitting: Internal Medicine

## 2016-06-01 ENCOUNTER — Other Ambulatory Visit: Payer: Self-pay | Admitting: Internal Medicine

## 2016-06-01 NOTE — Telephone Encounter (Signed)
Medication   hydrochlorothiazide (MICROZIDE) 12.5 MG capsule [19146]       hydrochlorothiazide (MICROZIDE) 12.5 MG capsule [[id number]][[id number]] DISCONTINUED     Order Details    Dose: 12.5 mg Route: Oral Frequency: Every morning - 10a   Dispense Quantity:  -- Refills:  -- Fills Remaining:  --          Sig: Take 12.5 mg by mouth every morning.         Discontinue Date:  04/15/2016 1003 Discontinue User:  Dorna LeitzNicole V Bush, PA-C Discontinue Reason:  Side effect (s)   Written Date:  -- Expiration Date:  -- Ordering Date:  10/01/15    Start Date:  -- End Date:  04/15/16     Ordering Provider:  -- Authorizing Provider:  Historical Provider, MD Ordering User:  Eddie Candleenise P Sexton, RN

## 2016-06-14 ENCOUNTER — Telehealth: Payer: Self-pay

## 2016-06-14 NOTE — Telephone Encounter (Signed)
[facility] sent a request and has not heard back from us.     ONE TOUCH ULTRA TEST test strip  THEY NEED SPECIFIC DIRECTIONS.  Do not send "take as directed."   [id number]

## 2016-06-16 ENCOUNTER — Other Ambulatory Visit: Payer: Self-pay

## 2016-06-16 MED ORDER — GLUCOSE BLOOD VI STRP: ORAL_STRIP | Status: DC

## 2016-06-19 ENCOUNTER — Other Ambulatory Visit: Payer: Self-pay

## 2016-06-19 MED ORDER — RANITIDINE HCL 150 MG PO TABS: ORAL_TABLET | ORAL | Status: DC

## 2016-06-19 NOTE — Telephone Encounter (Signed)
Ranitidine 150mg  tab (1 tab 2times daily) #60 RFx5  Original prescription 05/18/2016 Requested #90 w/1 refill - re-ordered.

## 2016-06-20 NOTE — Telephone Encounter (Signed)
Done already

## 2016-07-28 ENCOUNTER — Encounter: Payer: Self-pay | Admitting: Family Medicine

## 2016-07-28 ENCOUNTER — Ambulatory Visit (INDEPENDENT_AMBULATORY_CARE_PROVIDER_SITE_OTHER): Payer: 59 | Admitting: Family Medicine

## 2016-07-28 VITALS — BP 112/80 | HR 93 | Temp 98.3°F | Resp 16 | Ht 63.78 in | Wt 170.0 lb

## 2016-07-28 DIAGNOSIS — E785 Hyperlipidemia, unspecified: Secondary | ICD-10-CM

## 2016-07-28 DIAGNOSIS — E1165 Type 2 diabetes mellitus with hyperglycemia: Secondary | ICD-10-CM

## 2016-07-28 DIAGNOSIS — I10 Essential (primary) hypertension: Secondary | ICD-10-CM | POA: Diagnosis not present

## 2016-07-28 DIAGNOSIS — R7989 Other specified abnormal findings of blood chemistry: Secondary | ICD-10-CM | POA: Diagnosis not present

## 2016-07-28 DIAGNOSIS — IMO0001 Reserved for inherently not codable concepts without codable children: Secondary | ICD-10-CM

## 2016-07-28 DIAGNOSIS — R000 Tachycardia, unspecified: Secondary | ICD-10-CM

## 2016-07-28 LAB — COMPREHENSIVE METABOLIC PANEL
ALT: 11 U/L (ref 6–29)
AST: 14 U/L (ref 10–35)
Albumin: 4.6 g/dL (ref 3.6–5.1)
Alkaline Phosphatase: 105 U/L (ref 33–115)
BILIRUBIN TOTAL: 0.4 mg/dL (ref 0.2–1.2)
BUN: 10 mg/dL (ref 7–25)
CALCIUM: 9.5 mg/dL (ref 8.6–10.2)
CHLORIDE: 102 mmol/L (ref 98–110)
CO2: 27 mmol/L (ref 20–31)
Creat: 0.57 mg/dL (ref 0.50–1.10)
GLUCOSE: 109 mg/dL — AB (ref 65–99)
POTASSIUM: 4.4 mmol/L (ref 3.5–5.3)
Sodium: 139 mmol/L (ref 135–146)
Total Protein: 7.1 g/dL (ref 6.1–8.1)

## 2016-07-28 LAB — POCT GLYCOSYLATED HEMOGLOBIN (HGB A1C): Hemoglobin A1C: 7.6

## 2016-07-28 LAB — MICROALBUMIN / CREATININE URINE RATIO: CREATININE, URINE: 39 mg/dL (ref 20–320)

## 2016-07-28 LAB — THYROID PANEL WITH TSH
FREE THYROXINE INDEX: 1.8 (ref 1.4–3.8)
T3 UPTAKE: 28 % (ref 22–35)
T4 TOTAL: 6.4 ug/dL (ref 4.5–12.0)
TSH: 6.65 m[IU]/L — AB

## 2016-07-28 MED ORDER — METOPROLOL SUCCINATE ER 25 MG PO TB24: 25.0000 mg | ORAL_TABLET | Freq: Every day | ORAL | 1 refills | Status: DC

## 2016-07-28 MED ORDER — DULAGLUTIDE 1.5 MG/0.5ML ~~LOC~~ SOAJ: 1.5000 mg | SUBCUTANEOUS | 1 refills | Status: DC

## 2016-07-28 MED ORDER — FUROSEMIDE 20 MG PO TABS: ORAL_TABLET | ORAL | 1 refills | Status: DC

## 2016-07-28 NOTE — Patient Instructions (Addendum)
Ok to just take your metformin in the morning and then if you have a high carb dinner, dessert, or a large meal then take the evening metformin dose.  IF you received an x-ray today, you will receive an invoice from [facility]. Please contact [facility] at [phone] with questions or concerns regarding your invoice.   IF you received labwork today, you will receive an invoice from [facility]. Please contact [facility] at [phone] with questions or concerns regarding your invoice.   Our billing staff will not be able to assist you with questions regarding bills from these companies.  You will be contacted with the lab results as soon as they are available. The fastest way to get your results is to activate your My Chart account. Instructions are located on the last page of this paperwork. If you have not heard from us regarding the results in 2 weeks, please contact this office.     Paroxysmal Supraventricular Tachycardia Paroxysmal supraventricular tachycardia (PSVT) is a type of abnormal heart rhythm. It causes your heart to beat very quickly and then suddenly stop beating so quickly. A normal heart rate is 60-100 beats per minute. During an episode of PSVT, your heart rate may be 150-250 beats per minute. This can make you feel light-headed and short of breath. An episode of PSVT can be frightening. It is usually not dangerous. The heart has four chambers. All chambers need to work together for the heart to beat effectively. A normal heartbeat usually starts in the right upper chamber of the heart (atrium) when an area (sinoatrial node) puts out an electrical signal that spreads to the other chambers. People with PSVT may have abnormal electrical pathways, or they may have other areas in the upper chambers that send out electrical signals. The result is a very rapid heartbeat. When your heart beats very quickly, it does not have time to fill  completely with blood. When PSVT happens often or it lasts for long periods, it can lead to heart weakness and failure. Most people with PSVT do not have any other heart disease. CAUSES Abnormal electrical activity in the heart causes PSVT. It is not known why some people get PSVT and others do not. RISK FACTORS You may be more likely to have PSVT if:  You are 7020-47 years old.  You are a woman. Other factors that may increase your chances of an attack include:  Stress.  Being tired.  Smoking.  Stimulant drugs.  Alcoholic drinks.  Caffeine.  Pregnancy. SIGNS AND SYMPTOMS A mild episode of PSVT may cause no symptoms. If you do have signs and symptoms, they may include:  A pounding heart.  Feeling of skipped heartbeats (palpitations).  Weakness.  Shortness of breath.  Tightness or pain in your chest.  Light-headedness.  Anxiety.  Dizziness.  Sweating.  Nausea.  A fainting spell. DIAGNOSIS Your health care provider may suspect PSVT if you have symptoms that come and go. The health care provider will do a physical exam. If you are having an episode during the exam, the health care provider may be able to diagnose PSVT by listening to your heart and feeling your pulse. Tests may also be done, including:  An electrical study of your heart (electrocardiogram, or ECG).  A test in which you wear a portable ECG monitor all day (Holter monitor) or for several days (event monitor).  A test that involves taking an image of your heart using sound waves (echocardiogram) to rule  out other causes of a fast heart rate. TREATMENT You may not need treatment if episodes of PSVT do not happen often or if they do not cause symptoms. If PSVT episodes do cause symptoms, your health care provider may first suggest trying a self-treatment called vagus nerve stimulation. The vagus nerve extends down from the brain. It regulates certain body functions. Stimulating this nerve can slow down  the heart. Your health care provider can teach you ways to do this. You may need to try a few ways to find what works best for you. Options include:  Holding your breath and pushing, as though you are having a bowel movement.  Massaging an area on one side of your neck below your jaw.  Bending forward with your head between your legs.  Bending forward with your head between your legs and coughing.  Massaging your eyeballs with your eyes closed. If vagus nerve stimulation does not work, other treatment options include:  Medicines to prevent an attack.  Being treated in the hospital with medicine or electric shock to stop an attack (cardioversion). This treatment can include:  Getting medicine through an IV line.  Having a small electric shock delivered to your heart. You will be given medicine to make you sleep through this procedure.  If you have frequent episodes with symptoms, you may need a procedure to get rid of the faulty areas of your heart (radiofrequency ablation) and end the episodes of PSVT. In this procedure:  A long, thin tube (catheter) is passed through one of your veins into your heart.  Energy directed through the catheter eliminates the areas of your heart that are causing abnormal electric stimulation. HOME CARE INSTRUCTIONS  Take medicines only as directed by your health care provider.  Do not use caffeine in any form if caffeine triggers episodes of PSVT. Otherwise, consume caffeine in moderation. This means no more than a few cups of coffee or the equivalent each day.  Do not drink alcohol if alcohol triggers episodes of PSVT. Otherwise, limit alcohol intake to no more than 1 drink per day for nonpregnant women and 2 drinks per day for men. One drink equals 12 ounces of beer, 5 ounces of wine, or 1 ounces of hard liquor.  Do not use any tobacco products, including cigarettes, chewing tobacco, or electronic cigarettes. If you need help quitting, ask your health  care provider.  Try to get at least 7 hours of sleep each night.  Find healthy ways to manage stress.  Perform vagus nerve stimulation as directed by your health care provider.  Maintain a healthy weight.  Get some exercise on most days. Ask your health care provider to suggest some good activities for you. SEEK MEDICAL CARE IF:  You are having episodes of PSVT more often, or they are lasting longer.  Vagus nerve stimulation is no longer helping.  You have new symptoms during an episode. SEEK IMMEDIATE MEDICAL CARE IF:  You have chest pain or trouble breathing.  You have an episode of PSVT that has lasted longer than 20 minutes.  You have passed out from an episode of PSVT. These symptoms may represent a serious problem that is an emergency. Do not wait to see if the symptoms will go away. Get medical help right away. Call your local emergency services (911 in the [place] Do not drive yourself to the hospital.   This information is not intended to replace advice given to you by your health care provider. Make sure  you discuss any questions you have with your health care provider.   Document Released: 11/20/2005 Document Revised: 12/11/2014 Document Reviewed: 04/30/2014 Elsevier Interactive Patient Education [employer]

## 2016-07-30 ENCOUNTER — Emergency Department (HOSPITAL_COMMUNITY): Payer: 59

## 2016-07-30 ENCOUNTER — Emergency Department (HOSPITAL_COMMUNITY)
Admission: EM | Admit: 2016-07-30 | Discharge: 2016-07-30 | Disposition: A | Discharge status: Home / Self Care | Payer: 59 | Attending: Emergency Medicine | Admitting: Emergency Medicine

## 2016-07-30 ENCOUNTER — Encounter (HOSPITAL_COMMUNITY): Payer: Self-pay | Admitting: Emergency Medicine

## 2016-07-30 DIAGNOSIS — I10 Essential (primary) hypertension: Secondary | ICD-10-CM | POA: Diagnosis not present

## 2016-07-30 DIAGNOSIS — I493 Ventricular premature depolarization: Secondary | ICD-10-CM | POA: Insufficient documentation

## 2016-07-30 DIAGNOSIS — R911 Solitary pulmonary nodule: Secondary | ICD-10-CM | POA: Diagnosis not present

## 2016-07-30 DIAGNOSIS — R002 Palpitations: Secondary | ICD-10-CM

## 2016-07-30 DIAGNOSIS — Z87891 Personal history of nicotine dependence: Secondary | ICD-10-CM | POA: Insufficient documentation

## 2016-07-30 DIAGNOSIS — E1165 Type 2 diabetes mellitus with hyperglycemia: Secondary | ICD-10-CM | POA: Diagnosis not present

## 2016-07-30 DIAGNOSIS — Z7984 Long term (current) use of oral hypoglycemic drugs: Secondary | ICD-10-CM | POA: Diagnosis not present

## 2016-07-30 LAB — CBC
HCT: 41.6 % (ref 36.0–46.0)
Hemoglobin: 13.9 g/dL (ref 12.0–15.0)
MCH: 28.1 pg (ref 26.0–34.0)
MCHC: 33.4 g/dL (ref 30.0–36.0)
MCV: 84 fL (ref 78.0–100.0)
PLATELETS: 248 10*3/uL (ref 150–400)
RBC: 4.95 MIL/uL (ref 3.87–5.11)
RDW: 13.5 % (ref 11.5–15.5)
WBC: 5.8 10*3/uL (ref 4.0–10.5)

## 2016-07-30 LAB — BASIC METABOLIC PANEL
Anion gap: 7 (ref 5–15)
BUN: 7 mg/dL (ref 6–20)
CHLORIDE: 104 mmol/L (ref 101–111)
CO2: 27 mmol/L (ref 22–32)
CREATININE: 0.53 mg/dL (ref 0.44–1.00)
Calcium: 9.3 mg/dL (ref 8.9–10.3)
Glucose, Bld: 185 mg/dL — ABNORMAL HIGH (ref 65–99)
POTASSIUM: 4 mmol/L (ref 3.5–5.1)
SODIUM: 138 mmol/L (ref 135–145)

## 2016-07-30 LAB — I-STAT TROPONIN, ED: TROPONIN I, POC: 0 ng/mL (ref 0.00–0.08)

## 2016-07-30 NOTE — ED Notes (Signed)
Patient transported to CT 

## 2016-07-30 NOTE — Discharge Instructions (Signed)
Please read and follow all provided instructions.  Your diagnoses today include:  1. PVC (premature ventricular contraction)   2. Palpitations   3. Pulmonary nodule     Tests performed today include:  An EKG of your heart  A chest x-ray  Chest CT - does not show any problem with the sternum, does show a very small nodule which is not a problem since you do not smoke  Cardiac enzymes - a blood test for heart muscle damage was normal  Blood counts and electrolytes  Vital signs. See below for your results today.   Medications prescribed:   None  Take any prescribed medications only as directed.  Follow-up instructions: Please follow-up with your primary care provider for further evaluation of your symptoms. Discuss if metoprolol is a good medicine for you regarding palpitations.   Return instructions:  SEEK IMMEDIATE MEDICAL ATTENTION IF:  You have severe chest pain, especially if the pain is crushing or pressure-like and spreads to the arms, back, neck, or jaw, or if you have sweating, nausea (feeling sick to your stomach), or shortness of breath. THIS IS AN EMERGENCY. Don't wait to see if the pain will go away. Get medical help at once. Call 911 or 0 (operator). DO NOT drive yourself to the hospital.   Your chest pain gets worse and does not go away with rest.   You have an attack of chest pain lasting longer than usual, despite rest and treatment with the medications your caregiver has prescribed.   You wake from sleep with chest pain or shortness of breath.  You feel dizzy or faint.  You have chest pain not typical of your usual pain for which you originally saw your caregiver.   You have any other emergent concerns regarding your health.   Your vital signs today were: BP 118/88    Pulse 85    Temp 98 F (36.7 C) (Oral)    Resp 19    Ht 5\' 4"  (1.626 m)    Wt 77.1 kg    LMP 07/19/2016    SpO2 99%    BMI 29.18 kg/m  If your blood pressure (BP) was elevated above  135/85 this visit, please have this repeated by your doctor within one month. --------------

## 2016-07-30 NOTE — ED Provider Notes (Signed)
[facility]EM[facility]Y DEPT Provider Note   CSN: [id number] Arrival date & time: 07/30/16  1110     History   Chief Complaint Chief Complaint  Patient presents with  . Palpitations    HPI Julia [name] is a 45 y.o. female.  Patient with history of supraventricular tachycardia status post ablation, followed by Dr. Ladona Ridgel -- presents with persistent but worsening palpitations described as a flipping sensation in her chest. These are short-lived and intermittent. They are worse at night. Patient does report drinking some caffeine yesterday. Otherwise she is avoiding cold medications. She is not having any chest pain, lightheadedness or syncope. She does report some soreness over her sternum at times. No nausea, vomiting, or diarrhea. No treatments prior to arrival. The onset of this condition was acute. The course is constant. Aggravating factors: none. Alleviating factors: none.        Past Medical History:  Diagnosis Date  . Complication of anesthesia    I had a reaction to a epidural "broke out"  . Condyloma acuminatum of vulva   . GERD (gastroesophageal reflux disease)   . History of supraventricular tachycardia   . HSV-1 infection   . HSV-2 infection   . HTN (hypertension)   . Hyperlipidemia   . Type 2 diabetes mellitus [facility])     Patient Active Problem List   Diagnosis Date Noted  . Hyperlipidemia 07/28/2016  . Uncontrolled type 2 diabetes mellitus without complication, without long-term current use of insulin (HCC) 04/15/2016  . Sinus tachycardia (HCC) 11/12/2015  . S/P radiofrequency ablation operation for arrhythmia 02/24/2015  . SVT (supraventricular tachycardia) (HCC) 08/13/2014  . Palpitations 08/13/2014  . HTN (hypertension) 08/01/2012    Past Surgical History:  Procedure Laterality Date  . CARDIOVASCULAR STRESS TEST  06-04-2007   normal nuclear study/  no ischemia/  normal LVF, ef 63%  . CESAREAN SECTION  09-06-2005  . LASER ABLATION CONDOLAMATA N/A 10/08/2015   Procedure: CO2 LASER ABLATION CONDOLAMATA OF VULVA;  Surgeon: Richarda Overlie, MD;  Location: [facility];  Service: Gynecology;  Laterality: N/A;  . SUPRAVENTRICULAR TACHYCARDIA ABLATION N/A 02/24/2015   Procedure: SUPRAVENTRICULAR TACHYCARDIA ABLATION;  Surgeon: Marinus Maw, MD;  Location: [facility] CATH LAB;  Service: Cardiovascular;  Laterality: N/A;  . TRANSTHORACIC ECHOCARDIOGRAM  01-06-2009   normal LVF, ef 55-60%,  trace MR and TR/  normal stress echo    OB History    No data available       Home Medications    Prior to Admission medications   Medication Sig Start Date End Date Taking? Authorizing Provider  cetirizine (ZYRTEC) 10 MG chewable tablet Chew 10 mg by mouth daily.    Historical Provider, MD  Dulaglutide (TRULICITY) 1.5 MG/0.5ML SOPN Inject 1.5 mg into the skin once a week. 07/28/16   Sherren Mocha, MD  fluticasone (FLONASE) 50 MCG/ACT nasal spray Place 2 sprays into both nostrils daily. 03/12/15   Historical Provider, MD  furosemide (LASIX) 20 MG tablet TAKE 1 TABLET (20 MG TOTAL) BY MOUTH DAILY. 07/28/16   Sherren Mocha, MD  glucose blood (ONE TOUCH ULTRA TEST) test strip USE AS DIRECTED 06/16/16   Sherren Mocha, MD  glucose blood test strip Use as instructed 04/15/16   Dorna Leitz, PA-C  metFORMIN (GLUCOPHAGE) 1000 MG tablet Take 1 tablet (1,000 mg total) by mouth 2 (two) times daily with a meal. 04/23/16   Sherren Mocha, MD  metoprolol succinate (TOPROL-XL) 25 MG 24 hr tablet Take 1 tablet (  25 mg total) by mouth daily. 07/28/16   Sherren MochaEva N Shaw, MD  triamcinolone cream (KENALOG) 0.1 % Apply 1 application topically daily as needed. For rash    Historical Provider, MD    Family History Family History  Problem Relation Age of Onset  . Hypertension Mother   . Thyroid disease Mother   . Diabetes Mother   . Thyroid disease Sister   . Diabetes Maternal Grandmother   . Hyperlipidemia Other   . Thyroid disease Other     Social History Social History  Substance Use Topics    . Smoking status: Former Smoker    Years: 8.00    Types: Cigarettes    Quit date: 12/16/2002  . Smokeless tobacco: Never Used  . Alcohol use 1.0 oz/week    2 Standard drinks or equivalent per week     Comment: rare     Allergies   Review of patient's allergies indicates no known allergies.   Review of Systems Review of Systems  Constitutional: Negative for diaphoresis and fever.  Eyes: Negative for redness.  Respiratory: Negative for cough and shortness of breath.   Cardiovascular: Positive for chest pain. Negative for palpitations and leg swelling.  Gastrointestinal: Negative for abdominal pain, nausea and vomiting.  Genitourinary: Negative for dysuria.  Musculoskeletal: Negative for back pain and neck pain.  Skin: Negative for rash.  Neurological: Negative for syncope and light-headedness.  Psychiatric/Behavioral: The patient is not nervous/anxious.      Physical Exam Updated Vital Signs BP 118/88   Pulse 85   Temp 98 F (36.7 C) (Oral)   Resp 19   Ht 5\' 4"  (1.626 m)   Wt 77.1 kg   LMP 07/19/2016   SpO2 99%   BMI 29.18 kg/m   Physical Exam  Constitutional: She appears well-developed and well-nourished.  HENT:  Head: Normocephalic and atraumatic.  Mouth/Throat: Mucous membranes are normal. Mucous membranes are not dry.  Eyes: Conjunctivae are normal.  Neck: Trachea normal and normal range of motion. Neck supple. Normal carotid pulses and no JVD present. No muscular tenderness present. Carotid bruit is not present. No tracheal deviation present.  Cardiovascular: Normal rate, regular rhythm, S1 normal, S2 normal, normal heart sounds and intact distal pulses.  Exam reveals no decreased pulses.   No murmur heard. Pulmonary/Chest: Effort normal. No respiratory distress. She has no wheezes. She exhibits no tenderness.  Abdominal: Soft. Normal aorta and bowel sounds are normal. There is no tenderness. There is no rebound and no guarding.  Musculoskeletal: Normal  range of motion.  Neurological: She is alert.  Skin: Skin is warm and dry. She is not diaphoretic. No cyanosis. No pallor.  Psychiatric: She has a normal mood and affect.  Nursing note and vitals reviewed.    ED Treatments / Results  Labs (all labs ordered are listed, but only abnormal results are displayed) Labs Reviewed  BASIC METABOLIC PANEL - Abnormal; Notable for the following:       Result Value   Glucose, Bld 185 (*)    All other components within normal limits  CBC  PREGNANCY, URINE  I-STAT TROPOININ, ED    EKG  EKG Interpretation None       Radiology Dg Chest 2 View  Result Date: 07/30/2016 CLINICAL DATA:  Fluttering, palpitations for 12 hours EXAM: CHEST  2 VIEW COMPARISON:  None. FINDINGS: Normal mediastinum and cardiac silhouette. Normal pulmonary vasculature. No evidence of effusion, infiltrate, or pneumothorax. On the lateral projection there is an expansile lesion in  the inferior sternum measuring 4.7 x 2.8 cm. IMPRESSION: No acute cardiopulmonary process. Expansile lesion inferior sternum. Recommend correlation with prior trauma or surgery. If No such history, consider CT of the thorax for further evaluation. Electronically Signed   By: Genevive Bi M.D.   On: 07/30/2016 12:31   Ct Chest Wo Contrast  Result Date: 07/30/2016 CLINICAL DATA:  Heart palpitations, sternal lesion on chest radiograph EXAM: CT CHEST WITHOUT CONTRAST TECHNIQUE: Multidetector CT imaging of the chest was performed following the standard protocol without IV contrast. COMPARISON:  None. FINDINGS: Cardiovascular: Normal heart size. There is no pericardial effusion. Mediastinum/Nodes: The trachea appears patent and is midline. Normal appearance of the esophagus. No mediastinal or hilar adenopathy. No axillary or supraclavicular adenopathy. Lungs/Pleura: Nodule within the right upper lobe measures 3 mm, image 59 of series 3. Upper Abdomen: Normal appearance of the adrenal glands. There is no  focal liver abnormality. The visualized portions of the spleen are unremarkable. Musculoskeletal: No aggressive lytic or sclerotic bone lesions. IMPRESSION: 1. No suspicious bone lesions. 2. **An incidental finding of potential clinical significance has been found. Right mid lung nodule measures 3 mm. No follow-up needed if patient is low-risk. Non-contrast chest CT can be considered in 12 months if patient is high-risk. This recommendation follows the consensus statement: Guidelines for Management of Incidental Pulmonary Nodules Detected on CT Images:From the Fleischner Society 2017; published online before print (10.1148/radiol.[id number]).** Electronically Signed   By: Signa Kell M.D.   On: 07/30/2016 15:04    Procedures Procedures (including critical care time)  Medications Ordered in ED Medications - No data to display   Initial Impression / Assessment and Plan / ED Course  I have reviewed the triage vital signs and the nursing notes.  Pertinent labs & imaging results that were available during my care of the patient were reviewed by me and considered in my medical decision making (see chart for details).  Clinical Course   Patient seen and examined. We discussed chest x-ray findings. Patient given option of following up with PCP versus getting a CT scan today to further characterize. She will like to proceed with CT today. We discussed her palpitations. She had active palpitations while in bed that coincided with monomorphic PVCs on the monitor. She was having these once or twice a minute. Patient is on metoprolol at a low dose already to see if this helps her symptoms. She has discussed this with her primary care physician.  3:36 PM CT does not demonstrate the same abnormality is seen on chest x-ray. Likely an artifact. Patient informed the small pulmonary nodule, however she has not smoked in over 10 years. Feel that she is low risk and this does not require follow-up. However, she is  aware that nodule is present.   Encouraged to continue metoprolol at her current dose, follow-up with PCP and cardiologist discussed increasing dose or other treatment methods. Tried to reassure patient the PVCs are not dangerous and are more of a nuisance than anything else. Encouraged to return to the emergency department with any chest pain, shortness of breath, lightheadedness or syncope. Patient verbalizes understanding and agrees to plan. She is comfortable with discharge to home.  Final Clinical Impressions(s) / ED Diagnoses   Final diagnoses:  PVC (premature ventricular contraction)  Palpitations  Pulmonary nodule   Palpitations: Not the same as patient's previous racing heart with her SVT. Palpitations today coincided with PVC on monitor. She will continue her metoprolol and discuss this medication with her  physicians.  Pulmonary nodule: Patient is low risk. No follow-up required. Patient is aware. Sternal lesion  noted on chest x-ray not demonstrated on CT scan.  New Prescriptions New Prescriptions   No medications on file     Renne Crigler, PA-C 07/30/16 1539    Marily Memos, MD 07/30/16 1544

## 2016-07-30 NOTE — ED Triage Notes (Signed)
Pt states for the last 3 days she has been having intermittent palpitations with a history of SVT. pts hearts 86 NSR at this time. Pt reports right sided chest pressure. 3/10. Pt is warm and dry, breathing easily.

## 2016-08-06 NOTE — Progress Notes (Signed)
By signing my name below I, Shelah Lewandowsky, attest that this documentation has been prepared under the direction and in the presence of Norberto Sorenson, MD. Electonically Signed. Shelah Lewandowsky, Scribe 08/06/2016 at 11:20 AM   Subjective:    Patient ID: Julia [name], female    DOB: 1970-12-23, 45 y.o.   MRN: [id number]  Chief Complaint  Patient presents with  . Follow-up    glucose and discuss medications    HPI Julia [name] is a 44 y.o. female who presents to the [facility] for follow up reevaluation of uncontrolled DM. Pt last seen and evaluated at [facility] by Dr Clelia Croft on 05/02/16. At that time, pt's A1C was 12.0, Lipid triglycerides 800, HDL 30, LDL not calculable, non-HDL 210. Pt also had mildly elevated TSH of 5.1 at that time. Also at time of last visit, pt had been off all her medications for over several weeks and knew blood sugar was poorly controlled. Pt had been avoiding coming in for evaluation, because she was embarrassed by her non-compliance. At that point, pt taken off HCTZ and invokana due to photosensitive skin rash. 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 Pt was started on trulicity and restarted on her metformin.   Pt states that she is feeling much better today. Pt brought in a log sheet of her blood sugar and medications. Pt's fasting AM blood sugar is variable and 1/5 of the time sugar has been in the 110s and about half the time blood sugar has been 140-150 with occasional AM fasting blood sugar up to 180-200. 6 weeks ago, pt's sugars have been consistently in the 110s-120s. Pt reports that a week ago, she was traveling through [place] and her sugar was more elevated because she was not eating properly. Pt also has been forgetting to take metformin at night. Pt also reports that she thinks the trulicity is working well for her.   Pt has been doing weight watchers and has lost weight successfully.   Pt has history of SVT and had surgery and was put  on medications. Pt came off her heart medications 3 months ago. Pt started having heart palpitations a week ago and started taking metoprolol without side effects. Metoprolol has been controlling the palpitations.  Taking lasix, still having feet swelling when out in the heat.  Pt is excited about upcoming trip to the [place] next week.   Note that pt has a history of hypothyroidism and was stable off of synthroid for several years.  Pt has had a pneumonia vaccine a year ago.   Pt also reports that she last saw her eye doctor a year ago.   Patient Active Problem List   Diagnosis Date Noted  . Hyperlipidemia 07/28/2016  . Uncontrolled type 2 diabetes mellitus without complication, without long-term current use of insulin (HCC) 04/15/2016  . Sinus tachycardia (HCC) 11/12/2015  . S/P radiofrequency ablation operation for arrhythmia 02/24/2015  . SVT (supraventricular tachycardia) (HCC) 08/13/2014  . Palpitations 08/13/2014  . HTN (hypertension) 08/01/2012    Current Outpatient Prescriptions on File Prior to Visit  Medication Sig Dispense Refill  . fluticasone (FLONASE) 50 MCG/ACT nasal spray Place 2 sprays into both nostrils daily.  6  . glucose blood (ONE TOUCH ULTRA TEST) test strip USE AS DIRECTED 300 each 3  . glucose blood test strip Use as instructed 100 each 12  . metFORMIN (GLUCOPHAGE) 1000 MG tablet Take 1 tablet (1,000 mg total) by  mouth 2 (two) times daily with a meal. 90 tablet 3  . cetirizine (ZYRTEC) 10 MG chewable tablet Chew 10 mg by mouth daily.    Marland Kitchen. triamcinolone cream (KENALOG) 0.1 % Apply 1 application topically daily as needed. For rash     No current facility-administered medications on file prior to visit.     No Known Allergies  Depression screen [facility] 2/9 07/28/2016 04/22/2016 04/20/2016 04/15/2016 09/22/2014  Decreased Interest 0 0 0 0 0  Down, Depressed, Hopeless 0 0 0 0 0  PHQ - 2 Score 0 0 0 0 0       Review of Systems  Constitutional:  Negative for fever.  HENT: Negative for congestion.   Eyes: Negative for visual disturbance.  Respiratory: Negative for cough.   Cardiovascular: Positive for palpitations (resolved after restarting metoprolol).  Gastrointestinal: Negative for abdominal pain.  Genitourinary: Negative for dysuria.  Musculoskeletal: Negative for back pain.  Skin: Negative for rash.  Neurological: Negative for headaches.  Psychiatric/Behavioral: Negative for agitation.       Objective:   Physical Exam  Constitutional: She is oriented to person, place, and time. She appears well-developed and well-nourished. No distress.  HENT:  Head: Normocephalic and atraumatic.  Eyes: Conjunctivae are normal. Pupils are equal, round, and reactive to light.  Neck: Neck supple. Thyromegaly (mild) present.  Cardiovascular: Normal rate, regular rhythm, S1 normal, S2 normal and normal heart sounds.  Exam reveals no gallop and no friction rub.   No murmur heard. Pulmonary/Chest: Effort normal and breath sounds normal. No accessory muscle usage. No respiratory distress. She has no decreased breath sounds. She has no wheezes. She has no rhonchi. She has no rales.  Musculoskeletal: Normal range of motion.  Neurological: She is alert and oriented to person, place, and time.  Skin: Skin is warm and dry.  Psychiatric: She has a normal mood and affect. Her behavior is normal.  Nursing note and vitals reviewed.    BP 112/80 (BP Location: Right Arm, Patient Position: Sitting, Cuff Size: Normal)   Pulse 93   Temp 98.3 F (36.8 C)   Resp 16   Ht 5' 3.78" (1.62 m)   Wt 170 lb (77.1 kg)   LMP 07/19/2016   SpO2 98%   BMI 29.38 kg/m   Results for orders placed or performed in visit on 07/28/16  Comprehensive metabolic panel  Result Value Ref Range   Sodium 139 135 - 146 mmol/L   Potassium 4.4 3.5 - 5.3 mmol/L   Chloride 102 98 - 110 mmol/L   CO2 27 20 - 31 mmol/L   Glucose, Bld 109 (H) 65 - 99 mg/dL   BUN 10 7 - 25 mg/dL    Creat 0.960.57 0.450.50 - 4.091.10 mg/dL   Total Bilirubin 0.4 0.2 - 1.2 mg/dL   Alkaline Phosphatase 105 33 - 115 U/L   AST 14 10 - 35 U/L   ALT 11 6 - 29 U/L   Total Protein 7.1 6.1 - 8.1 g/dL   Albumin 4.6 3.6 - 5.1 g/dL   Calcium 9.5 8.6 - 81.110.2 mg/dL  Thyroid Panel With TSH  Result Value Ref Range   T4, Total 6.4 4.5 - 12.0 ug/dL   T3 Uptake 28 22 - 35 %   Free Thyroxine Index 1.8 1.4 - 3.8   TSH 6.65 (H) mIU/L  Microalbumin/Creatinine Ratio, Urine  Result Value Ref Range   Creatinine, Urine 39 20 - 320 mg/dL   Microalb, Ur <9.1<0.2 Not estab mg/dL  Microalb Creat Ratio SEE NOTE <30 mcg/mg creat  POCT glycosylated hemoglobin (Hb A1C)  Result Value Ref Range   Hemoglobin A1C 7.6         Assessment & Plan:   1. Uncontrolled type 2 diabetes mellitus without complication, without long-term current use of insulin (HCC) - doing AMAZING!  Huge improvement and tol meds well. Cont metformin qam and try using evening dose only when having carb heavy since she has such poor compliance with evening dose currently, increase trulicity  2. Essential hypertension   3. Hyperlipidemia   4. Abnormal TSH   5. Sinus tachycardia (HCC) - cont toprol to control sxs    Orders Placed This Encounter  Procedures  . Comprehensive metabolic panel  . Thyroid Panel With TSH  . Microalbumin/Creatinine Ratio, Urine  . POCT glycosylated hemoglobin (Hb A1C)    Meds ordered this encounter  Medications  . DISCONTD: metoprolol succinate (TOPROL-XL) 25 MG 24 hr tablet    Sig: Take 25 mg by mouth daily.  . metoprolol succinate (TOPROL-XL) 25 MG 24 hr tablet    Sig: Take 1 tablet (25 mg total) by mouth daily.    Dispense:  90 tablet    Refill:  1  . furosemide (LASIX) 20 MG tablet    Sig: TAKE 1 TABLET (20 MG TOTAL) BY MOUTH DAILY.    Dispense:  90 tablet    Refill:  1  . Dulaglutide (TRULICITY) 1.5 MG/0.5ML SOPN    Sig: Inject 1.5 mg into the skin once a week.    Dispense:  12 mL    Refill:  1    I  personally performed the services described in this documentation, which was scribed in my presence. The recorded information has been reviewed and considered, and addended by me as needed.   Norberto Sorenson, M.D.  [facility] [address] [place], [place] [address] [phone] phone [phone] fax  08/06/16 11:15 PM

## 2016-08-08 ENCOUNTER — Ambulatory Visit (INDEPENDENT_AMBULATORY_CARE_PROVIDER_SITE_OTHER): Payer: 59

## 2016-08-08 ENCOUNTER — Ambulatory Visit (INDEPENDENT_AMBULATORY_CARE_PROVIDER_SITE_OTHER): Payer: 59 | Admitting: Physician Assistant

## 2016-08-08 VITALS — BP 120/70 | HR 91 | Temp 98.8°F | Resp 18 | Ht 64.0 in | Wt 172.0 lb

## 2016-08-08 DIAGNOSIS — S62651A Nondisplaced fracture of medial phalanx of left index finger, initial encounter for closed fracture: Secondary | ICD-10-CM

## 2016-08-08 DIAGNOSIS — M79645 Pain in left finger(s): Secondary | ICD-10-CM | POA: Diagnosis not present

## 2016-08-08 DIAGNOSIS — M7989 Other specified soft tissue disorders: Secondary | ICD-10-CM

## 2016-08-08 NOTE — Progress Notes (Signed)
Splint placed to left index finger and applied buddy tape

## 2016-08-08 NOTE — Patient Instructions (Addendum)
  Buddy tape and ice and motrin for the pain - recheck in 10-14 days for a repeat xray   IF you received an x-ray today, you will receive an invoice [facility]. Please contact [facility] at [phone] with questions or concerns regarding your invoice.   IF you received labwork today, you will receive an invoice from [facility]. Please contact [facility] at [phone] with questions or concerns regarding your invoice.   Our billing staff will not be able to assist you with questions regarding bills from these companies.  You will be contacted with the lab results as soon as they are available. The fastest way to get your results is to activate your My Chart account. Instructions are located on the last page of this paperwork. If you have not heard from us regarding the results in 2 weeks, please contact this office.

## 2016-08-08 NOTE — Progress Notes (Signed)
Julia [name]  MRN: [id number] DOB: 1971/03/23  Subjective:  Pt presents to clinic with left index pain for the last 5 days after she was walking through a doorway and her finger got caught on the door frame - sh had immediate pain.  She has taken a few doses of motrin for the 1st 2 days because the pain was so bad but she has not taken it since then because she does not like to take medication.  Review of Systems  Patient Active Problem List   Diagnosis Date Noted  . Hyperlipidemia 07/28/2016  . Uncontrolled type 2 diabetes mellitus without complication, without long-term current use of insulin (HCC) 04/15/2016  . Sinus tachycardia (HCC) 11/12/2015  . S/P radiofrequency ablation operation for arrhythmia 02/24/2015  . SVT (supraventricular tachycardia) (HCC) 08/13/2014  . Palpitations 08/13/2014  . HTN (hypertension) 08/01/2012    Current Outpatient Prescriptions on File Prior to Visit  Medication Sig Dispense Refill  . Dulaglutide (TRULICITY) 1.5 MG/0.5ML SOPN Inject 1.5 mg into the skin once a week. 12 mL 1  . fluticasone (FLONASE) 50 MCG/ACT nasal spray Place 2 sprays into both nostrils daily.  6  . furosemide (LASIX) 20 MG tablet TAKE 1 TABLET (20 MG TOTAL) BY MOUTH DAILY. 90 tablet 1  . glucose blood (ONE TOUCH ULTRA TEST) test strip USE AS DIRECTED 300 each 3  . glucose blood test strip Use as instructed 100 each 12  . metFORMIN (GLUCOPHAGE) 1000 MG tablet Take 1 tablet (1,000 mg total) by mouth 2 (two) times daily with a meal. 90 tablet 3  . metoprolol succinate (TOPROL-XL) 25 MG 24 hr tablet Take 1 tablet (25 mg total) by mouth daily. (Patient taking differently: Take 50 mg by mouth daily. ) 90 tablet 1  . triamcinolone cream (KENALOG) 0.1 % Apply 1 application topically daily as needed. For rash     No current facility-administered medications on file prior to visit.     No Known Allergies  Pt patients past, family and social history were reviewed and  updated.  Objective:  BP 120/70 (BP Location: Right Arm, Patient Position: Sitting, Cuff Size: Small)   Pulse 91   Temp 98.8 F (37.1 C) (Oral)   Resp 18   Ht 5\' 4"  (1.626 m)   Wt 172 lb (78 kg)   LMP 07/19/2016   SpO2 99%   BMI 29.52 kg/m   Physical Exam  Constitutional: She is oriented to person, place, and time and well-developed, well-nourished, and in no distress.  HENT:  Head: Normocephalic and atraumatic.  Right Ear: Hearing and external ear normal.  Left Ear: Hearing and external ear normal.  Eyes: Conjunctivae are normal.  Neck: Normal range of motion.  Pulmonary/Chest: Effort normal.  Musculoskeletal:       Hands: Neurological: She is alert and oriented to person, place, and time. Gait normal.  Skin: Skin is warm and dry.  Psychiatric: Mood, memory, affect and judgment normal.  Vitals reviewed.  Dg Finger Index Left  Result Date: 08/08/2016 CLINICAL DATA:  Closed door on finger.  Pain and swelling EXAM: LEFT INDEX FINGER 2+V COMPARISON:  None. FINDINGS: Fracture involving the base of the the second middle phalanx extending into the PIP joint. Fracture involves the volar plate. No significant displacement. No joint space narrowing. IMPRESSION: Fracture at the base of the second middle phalanx extending into the PIP joint. Electronically Signed   By: Marlan Palau M.D.   On: 08/08/2016 17:56    Assessment and  Plan :  Swelling of limb - Plan: DG Finger Index Left  Finger pain, left  Closed nondisplaced fracture of middle phalanx of left index finger, initial encounter   Buddy tape with static splint - ok for patient to remove to shower - use tylenol and motrin to help with pain - recheck in 10 days to make sure placement of fx has not changed.  Benny LennertSarah Isyss Espinal PA-C  [facility] 08/08/2016 6:25 PM

## 2016-08-09 ENCOUNTER — Encounter: Payer: Self-pay | Admitting: Family Medicine

## 2016-08-18 ENCOUNTER — Ambulatory Visit (INDEPENDENT_AMBULATORY_CARE_PROVIDER_SITE_OTHER): Payer: 59

## 2016-08-18 ENCOUNTER — Encounter: Payer: Self-pay | Admitting: Physician Assistant

## 2016-08-18 ENCOUNTER — Ambulatory Visit (INDEPENDENT_AMBULATORY_CARE_PROVIDER_SITE_OTHER): Payer: 59 | Admitting: Physician Assistant

## 2016-08-18 VITALS — BP 126/88 | HR 86 | Temp 98.1°F | Resp 18 | Ht 64.0 in | Wt 168.0 lb

## 2016-08-18 DIAGNOSIS — M79645 Pain in left finger(s): Secondary | ICD-10-CM | POA: Diagnosis not present

## 2016-08-18 NOTE — Progress Notes (Signed)
Julia [name]  MRN: [id number] DOB: 1971-11-18  PCP: Norberto SorensonSHAW,EVA, MD  Subjective:  Pt is a pleasant 45 year old female presents to clinic for follow-up finger fracture of left index finger.  She was seen by Benny LennertSarah Weber 10 days ago. She initially caught her finger in a door frame. States she has been keeping it buddy taped as instructed by Maralyn SagoSarah.  Says she cannot bend finger very well due to swelling and pain. Is not elevating it. Uses ice sometimes. Takes motrin for pain as needed. She does not like taking medication.    Review of Systems  Constitutional: Negative for chills, diaphoresis and fever.  Cardiovascular: Negative.   Gastrointestinal: Negative.   Musculoskeletal: Positive for arthralgias (finger pain). Negative for joint swelling.  Skin: Negative.     Patient Active Problem List   Diagnosis Date Noted  . Hyperlipidemia 07/28/2016  . Uncontrolled type 2 diabetes mellitus without complication, without long-term current use of insulin (HCC) 04/15/2016  . Sinus tachycardia (HCC) 11/12/2015  . S/P radiofrequency ablation operation for arrhythmia 02/24/2015  . SVT (supraventricular tachycardia) (HCC) 08/13/2014  . Palpitations 08/13/2014  . HTN (hypertension) 08/01/2012    Current Outpatient Prescriptions on File Prior to Visit  Medication Sig Dispense Refill  . Dulaglutide (TRULICITY) 1.5 MG/0.5ML SOPN Inject 1.5 mg into the skin once a week. 12 mL 1  . fluticasone (FLONASE) 50 MCG/ACT nasal spray Place 2 sprays into both nostrils daily.  6  . furosemide (LASIX) 20 MG tablet TAKE 1 TABLET (20 MG TOTAL) BY MOUTH DAILY. 90 tablet 1  . glucose blood (ONE TOUCH ULTRA TEST) test strip USE AS DIRECTED 300 each 3  . glucose blood test strip Use as instructed 100 each 12  . metFORMIN (GLUCOPHAGE) 1000 MG tablet Take 1 tablet (1,000 mg total) by mouth 2 (two) times daily with a meal. 90 tablet 3  . metoprolol succinate (TOPROL-XL) 25 MG 24 hr tablet Take 1 tablet (25 mg total) by mouth  daily. (Patient taking differently: Take 50 mg by mouth daily. ) 90 tablet 1  . triamcinolone cream (KENALOG) 0.1 % Apply 1 application topically daily as needed. For rash     No current facility-administered medications on file prior to visit.     No Known Allergies  Objective:  BP 126/88 (BP Location: Right Arm, Patient Position: Sitting, Cuff Size: Small)   Pulse 86   Temp 98.1 F (36.7 C) (Oral)   Resp 18   Ht 5\' 4"  (1.626 m)   Wt 168 lb (76.2 kg)   LMP 07/19/2016   SpO2 100%   BMI 28.84 kg/m   Physical Exam  Constitutional: She is oriented to person, place, and time and well-developed, well-nourished, and in no distress. No distress.  Cardiovascular: Normal rate, regular rhythm and normal heart sounds.   Pulmonary/Chest: No respiratory distress.  Musculoskeletal:  Swelling second digit left hand. Reduced ROM due to swelling. Minor TTP PIP joint. No deformity, erythema, or temperature abnormality appreciated.    Neurological: She is alert and oriented to person, place, and time. GCS score is 15.  Skin: Skin is warm and dry.  Psychiatric: Mood, memory, affect and judgment normal.  Vitals reviewed.   Dg Finger Index Left  Result Date: 08/18/2016 CLINICAL DATA:  Follow-up recent fracture. EXAM: LEFT INDEX FINGER 2+V COMPARISON:  08/08/2016 FINDINGS: Three views study again shows the anterior corner fracture involving the base of the middle phalanx. No further distraction of the fracture fragments. Alignment remains intact  appeared overlying soft tissues are unremarkable. IMPRESSION: Stable. Electronically Signed   By: Kennith Center M.D.   On: 08/18/2016 09:05   Assessment and Plan :  1. Pain of finger of left hand - Her finger fracture is stable. Discussed with patient importance of elevating hand, as this will decrease the amount of swelling to her finger. Continue using splint and buddy tape. Elevate, motrin. Follow-up in 10 days  - DG Finger Index Left; Future   Marco Collie, PA-C  [facility] 08/18/2016 8:24 AM

## 2016-08-18 NOTE — Patient Instructions (Addendum)
Continue using your splint. Please be sure to elevate finger several times a day at or above heart level. Use your motrin/ibuprofen as directed for better pain relief.  Follow-up in 10 days.  Trulicity- you have 14 days after it is out of the refrigerator before it expires. Do not expose it to more than 86 degrees F.    Thank you for coming in today. I hope you feel we met your needs.  Feel free to call [facility] if you have any questions or further requests.  Please consider signing up for MyChart if you do not already have it, as this is a great way to communicate with me.  Best,  Whitney McVey, PA-C   IF you received an x-ray today, you will receive an invoice from [facility]. Please contact [facility] at [phone] with questions or concerns regarding your invoice.   IF you received labwork today, you will receive an invoice from [employer]. Please contact [facility] at [phone] with questions or concerns regarding your invoice.   Our billing staff will not be able to assist you with questions regarding bills from these companies.  You will be contacted with the lab results as soon as they are available. The fastest way to get your results is to activate your My Chart account. Instructions are located on the last page of this paperwork. If you have not heard from [place] regarding the results in 2 weeks, please contact this office.

## 2016-09-29 NOTE — Progress Notes (Signed)
Cardiology Office Note    Date:  10/02/2016   ID:  Julia [name], DOB Jun 12, 1971, MRN [id number]  PCP:  Norberto Sorenson, MD  Cardiologist:  Dr. Elease Hashimoto Electrophysiologist: Dr. Ladona Ridgel  Chief Complaint: palpitations  History of Present Illness:   Julia [name] is a 45 y.o. female SVT s/p ablation 02/2015, HTN, HTN, DM and GERD who presented for   Stress echo in 2010 for chest discomfort was normal. Hx of SVT ablation 02/2015 by Dr. Ladona Ridgel. Metoprolol stopped at that time.   She was doing well on cardiac stand point when last seen by Dr. Elease Hashimoto 11/12/15. Started on Toprol-XL 25 mg a day. F/u PRN.   Here today for evaluation of palpitation. She self discontinued Troprol-XL in May 2017. Noted palpitations in Aug 2017 and back on Toprol xl 25mg  qd by herself--> currently taking Troprol 50mg  qd and extra 25mg  for breakthrough palpitations. No improvement. She gets palpitations 2-3 times/week lasting 2-3 minutes, sometime more. No associated dizziness, dyspnea, chest pain or syncope. She drinks 12 oz of soda with meals and noted palpitation after wards. Mostly in AM before she takes her AM dose of metoprolol. Recent lab from PCP showed elevated TSH but normal Free T4 and T3.   Past Medical History:  Diagnosis Date  . Complication of anesthesia    I had a reaction to a epidural "broke out"  . Condyloma acuminatum of vulva   . GERD (gastroesophageal reflux disease)   . History of supraventricular tachycardia   . HSV-1 infection   . HSV-2 infection   . HTN (hypertension)   . Hyperlipidemia   . Type 2 diabetes mellitus (HCC)     Past Surgical History:  Procedure Laterality Date  . CARDIOVASCULAR STRESS TEST  06-04-2007   normal nuclear study/  no ischemia/  normal LVF, ef 63%  . CESAREAN SECTION  09-06-2005  . LASER ABLATION CONDOLAMATA N/A 10/08/2015   Procedure: CO2 LASER ABLATION CONDOLAMATA OF VULVA;  Surgeon: Richarda Overlie, MD;  Location: [facility];  Service: Gynecology;   Laterality: N/A;  . SUPRAVENTRICULAR TACHYCARDIA ABLATION N/A 02/24/2015   Procedure: SUPRAVENTRICULAR TACHYCARDIA ABLATION;  Surgeon: Marinus Maw, MD;  Location: [facility] CATH LAB;  Service: Cardiovascular;  Laterality: N/A;  . TRANSTHORACIC ECHOCARDIOGRAM  01-06-2009   normal LVF, ef 55-60%,  trace MR and TR/  normal stress echo    Current Medications: Prior to Admission medications   Medication Sig Start Date End Date Taking? Authorizing Provider  Dulaglutide (TRULICITY) 1.5 MG/0.5ML SOPN Inject 1.5 mg into the skin once a week. 07/28/16   Sherren Mocha, MD  fluticasone (FLONASE) 50 MCG/ACT nasal spray Place 2 sprays into both nostrils daily. 03/12/15   Historical Provider, MD  furosemide (LASIX) 20 MG tablet TAKE 1 TABLET (20 MG TOTAL) BY MOUTH DAILY. 07/28/16   Sherren Mocha, MD  glucose blood (ONE TOUCH ULTRA TEST) test strip USE AS DIRECTED 06/16/16   Sherren Mocha, MD  glucose blood test strip Use as instructed 04/15/16   Dorna Leitz, PA-C  metFORMIN (GLUCOPHAGE) 1000 MG tablet Take 1 tablet (1,000 mg total) by mouth 2 (two) times daily with a meal. 04/23/16   Sherren Mocha, MD  metoprolol succinate (TOPROL-XL) 25 MG 24 hr tablet Take 1 tablet (25 mg total) by mouth daily. Patient taking differently: Take 50 mg by mouth daily.  07/28/16   Sherren Mocha, MD  triamcinolone cream (KENALOG) 0.1 % Apply 1 application topically daily as needed.  For rash    Historical Provider, MD    Allergies:   Review of patient's allergies indicates no known allergies.   Social History   Social History  . Marital status: Married    Spouse name: N/A  . Number of children: N/A  . Years of education: N/A   Social History Main Topics  . Smoking status: Former Smoker    Years: 8.00    Types: Cigarettes    Quit date: 12/16/2002  . Smokeless tobacco: Never Used  . Alcohol use 1.0 oz/week    2 Standard drinks or equivalent per week     Comment: rare  . Drug use: No  . Sexual activity: Yes    Birth control/ protection:  None   Other Topics Concern  . Not on file   Social History Narrative  . No narrative on file     Family History:  The patient's family history includes Diabetes in her maternal grandmother and mother; Hyperlipidemia in her other; Hypertension in her mother; Thyroid disease in her mother, other, and sister.   ROS:   Please see the history of present illness.    ROS All other systems reviewed and are negative.   PHYSICAL EXAM:   VS:  BP 134/82   Pulse (!) 112   Ht 5\' 4"  (1.626 m)   Wt 172 lb (78 kg)   BMI 29.52 kg/m    GEN: Well nourished, well developed, in no acute distress  HEENT: normal  Neck: no JVD, carotid bruits, or masses Cardiac: RRR; no murmurs, rubs, or gallops,no edema  Respiratory:  clear to auscultation bilaterally, normal work of breathing GI: soft, nontender, nondistended, + BS MS: no deformity or atrophy  Skin: warm and dry, no rash Neuro:  Alert and Oriented x 3, Strength and sensation are intact Psych: euthymic mood, full affect  Wt Readings from Last 3 Encounters:  10/02/16 172 lb (78 kg)  08/18/16 168 lb (76.2 kg)  08/08/16 172 lb (78 kg)      Studies/Labs Reviewed:   EKG:  EKG is not ordered today.    Recent Labs: 07/28/2016: ALT 11; TSH 6.65 07/30/2016: BUN 7; Creatinine, Ser 0.53; Hemoglobin 13.9; Platelets 248; Potassium 4.0; Sodium 138   Lipid Panel    Component Value Date/Time   CHOL 240 (H) 04/15/2016 0912   TRIG 798 (H) 04/15/2016 0912   HDL 30 (L) 04/15/2016 0912   CHOLHDL 8.0 (H) 04/15/2016 0912   VLDL NOT CALC 04/15/2016 0912   LDLCALC NOT CALC 04/15/2016 0912    Additional studies/ records that were reviewed today include:   As above   ASSESSMENT & PLAN:    1. Palpitations - different from SVT. Likely due to caffeine and abnormal thyroid level. Advised to completley stop cafieenated product. She TSH check however she wished to f/u with PCP. Can not increase BB due to fatigue. Add Cardizem CD at night. PRN short acting  Cardizem for breakthrough palpitations. Will get 30 days monitor after her trip from [place] and [place].   2. SVT s/p ablation 2016  3. HTN - Stable and well controlled   4. Abnormal TSH - F/u with PCP    Medication Adjustments/Labs and Tests Ordered: Current medicines are reviewed at length with the patient today.  Concerns regarding medicines are outlined above.  Medication changes, Labs and Tests ordered today are listed in the Patient Instructions below. Patient Instructions  Medication Instructions:  Your physician has recommended you make the following change  in your medication:  1-Cardizem CD 120 mg by mouth nightly 2-Cardizem 30 mg by mouth as needed for palpitations.  Labwork: NONE  Testing/Procedures: Your physician has recommended that you wear an 30 day event monitor. Event monitors are medical devices that record the heart's electrical activity. Doctors most often us these monitors to diagnose arrhythmias. Arrhythmias are problems with the speed or rhythm of the heartbeat. The monitor is a small, portable device. You can wear one while you do your normal daily activities. This is usually used to diagnose what is causing palpitations/syncope (passing out).  Follow-Up: Your physician wants you to follow-up with Dr. Elease HashimotoNahser after monitor result.   If you need a refill on your cardiac medications before your next appointment, please call your pharmacy.       Lorelei PontSigned, Chino Sardo, [place]  10/02/2016 9:34 AM    [facility] [address], [place], [place]  [address] Phone: [phone]) [phone]; Fax: [phone]

## 2016-10-02 ENCOUNTER — Ambulatory Visit (INDEPENDENT_AMBULATORY_CARE_PROVIDER_SITE_OTHER): Payer: 59 | Admitting: Physician Assistant

## 2016-10-02 VITALS — BP 134/82 | HR 112 | Ht 64.0 in | Wt 172.0 lb

## 2016-10-02 DIAGNOSIS — I471 Supraventricular tachycardia: Secondary | ICD-10-CM | POA: Diagnosis not present

## 2016-10-02 DIAGNOSIS — Z9889 Other specified postprocedural states: Secondary | ICD-10-CM

## 2016-10-02 DIAGNOSIS — R002 Palpitations: Secondary | ICD-10-CM | POA: Diagnosis not present

## 2016-10-02 DIAGNOSIS — I10 Essential (primary) hypertension: Secondary | ICD-10-CM

## 2016-10-02 DIAGNOSIS — R946 Abnormal results of thyroid function studies: Secondary | ICD-10-CM

## 2016-10-02 DIAGNOSIS — R7989 Other specified abnormal findings of blood chemistry: Secondary | ICD-10-CM

## 2016-10-02 DIAGNOSIS — Z8679 Personal history of other diseases of the circulatory system: Secondary | ICD-10-CM

## 2016-10-02 MED ORDER — DILTIAZEM HCL ER COATED BEADS 120 MG PO CP24: 120.0000 mg | ORAL_CAPSULE | Freq: Every day | ORAL | 3 refills | Status: DC

## 2016-10-02 MED ORDER — DILTIAZEM HCL 30 MG PO TABS: 30.0000 mg | ORAL_TABLET | Freq: Every day | ORAL | 3 refills | Status: DC | PRN

## 2016-10-02 NOTE — Patient Instructions (Addendum)
Medication Instructions:  Your physician has recommended you make the following change in your medication:  1-Cardizem CD 120 mg by mouth nightly 2-Cardizem 30 mg by mouth as needed for palpitations.  Labwork: NONE  Testing/Procedures: Your physician has recommended that you wear an 30 day event monitor. Event monitors are medical devices that record the heart's electrical activity. Doctors most often us these monitors to diagnose arrhythmias. Arrhythmias are problems with the speed or rhythm of the heartbeat. The monitor is a small, portable device. You can wear one while you do your normal daily activities. This is usually used to diagnose what is causing palpitations/syncope (passing out).  Follow-Up: Your physician wants you to follow-up with Dr. Elease HashimotoNahser after monitor result.   If you need a refill on your cardiac medications before your next appointment, please call your pharmacy.

## 2016-10-06 ENCOUNTER — Telehealth: Payer: Self-pay | Admitting: *Deleted

## 2016-10-06 MED ORDER — METOPROLOL SUCCINATE ER 50 MG PO TB24: 50.0000 mg | ORAL_TABLET | Freq: Every day | ORAL | 3 refills | Status: DC

## 2016-10-06 NOTE — Addendum Note (Signed)
Addended by: Levi AlandSWINYER, Latronda Spink M on: 10/06/2016 05:26 PM   Modules accepted: Orders

## 2016-10-06 NOTE — Telephone Encounter (Signed)
Spoke with patient to clarify medication instructions.  I advised per Dr. Elease HashimotoNahser, I will refill Toprol 50 mg tablets for patient to take daily and that she may take an additional 25 mg as needed.  She has a follow-up appointment with Dr. Elease HashimotoNahser on 1/16.

## 2016-10-06 NOTE — Telephone Encounter (Signed)
Patient called and was rude to me, but upset that she saw Bhagat, [place] on 10/02/16 and he instructed her to take metoprolol 50 mg qd, but an rx was not sent in. Her med list from that office visit has 25 mg qd listed and her snapshot has 50-75 mg qd listed. Per documentation from recent office visit She self discontinued Troprol-XL in May 2017. Noted palpitations in Aug 2017 and back on Toprol xl 25mg  qd by herself--> currently taking Troprol 50mg  qd and extra 25mg  for breakthrough palpitations. No improvement. She gets palpitations 2-3 times/week lasting 2-3 minutes, sometime more. No associated dizziness, dyspnea, chest pain or syncope. She drinks 12 oz of soda with meals and noted palpitation after wards. Mostly in AM before she takes her AM dose of metoprolol. She would like this sent to the mail order pharmacy. Please advise. Thanks, MI

## 2016-10-24 ENCOUNTER — Other Ambulatory Visit: Payer: Self-pay | Admitting: Family Medicine

## 2016-10-24 DIAGNOSIS — IMO0001 Reserved for inherently not codable concepts without codable children: Secondary | ICD-10-CM

## 2016-10-24 DIAGNOSIS — E1165 Type 2 diabetes mellitus with hyperglycemia: Principal | ICD-10-CM

## 2016-10-24 NOTE — Telephone Encounter (Signed)
07/2016 last ov and labs  

## 2016-10-31 ENCOUNTER — Encounter: Payer: Self-pay | Admitting: *Deleted

## 2016-10-31 NOTE — Progress Notes (Signed)
Patient ID: Julia [name], female   DOB: 15-May-1971, 45 y.o.   MRN: [id number] Patient did not show up for 10/31/17, 8:30 AM, appointment to have a 30 day cardiac event monitor applied.

## 2016-12-07 ENCOUNTER — Ambulatory Visit (INDEPENDENT_AMBULATORY_CARE_PROVIDER_SITE_OTHER): Payer: 59

## 2016-12-07 DIAGNOSIS — R002 Palpitations: Secondary | ICD-10-CM

## 2016-12-19 ENCOUNTER — Ambulatory Visit: Payer: 59 | Admitting: Cardiovascular Disease

## 2016-12-19 ENCOUNTER — Ambulatory Visit: Payer: 59 | Admitting: Physician Assistant

## 2016-12-24 ENCOUNTER — Other Ambulatory Visit: Payer: Self-pay | Admitting: Family Medicine

## 2016-12-24 DIAGNOSIS — IMO0001 Reserved for inherently not codable concepts without codable children: Secondary | ICD-10-CM

## 2016-12-24 DIAGNOSIS — E1165 Type 2 diabetes mellitus with hyperglycemia: Principal | ICD-10-CM

## 2016-12-27 ENCOUNTER — Telehealth: Payer: Self-pay | Admitting: Physician Assistant

## 2016-12-27 NOTE — Telephone Encounter (Signed)
Attempted to contact patient.  Medication refill of metformin given however she should return within 30 days as advised on prescription.  For follow up of diabetes

## 2017-01-19 ENCOUNTER — Encounter: Payer: Self-pay | Admitting: Cardiovascular Disease

## 2017-01-19 ENCOUNTER — Ambulatory Visit (INDEPENDENT_AMBULATORY_CARE_PROVIDER_SITE_OTHER): Payer: 59 | Admitting: Cardiovascular Disease

## 2017-01-19 ENCOUNTER — Telehealth: Payer: Self-pay | Admitting: Cardiovascular Disease

## 2017-01-19 VITALS — BP 146/92 | HR 100 | Ht 64.0 in | Wt 178.1 lb

## 2017-01-19 DIAGNOSIS — R002 Palpitations: Secondary | ICD-10-CM | POA: Diagnosis not present

## 2017-01-19 DIAGNOSIS — I471 Supraventricular tachycardia, unspecified: Secondary | ICD-10-CM

## 2017-01-19 DIAGNOSIS — Z5181 Encounter for therapeutic drug level monitoring: Secondary | ICD-10-CM

## 2017-01-19 DIAGNOSIS — Z5189 Encounter for other specified aftercare: Secondary | ICD-10-CM | POA: Diagnosis not present

## 2017-01-19 LAB — BASIC METABOLIC PANEL
BUN/Creatinine Ratio: 18 (ref 9–23)
BUN: 11 mg/dL (ref 6–24)
CALCIUM: 8.9 mg/dL (ref 8.7–10.2)
CHLORIDE: 96 mmol/L (ref 96–106)
CO2: 23 mmol/L (ref 18–29)
Creatinine, Ser: 0.61 mg/dL (ref 0.57–1.00)
GFR calc non Af Amer: 108 mL/min/{1.73_m2} (ref >59)
GFR, EST AFRICAN AMERICAN: 125 mL/min/{1.73_m2} (ref >59)
Glucose: 366 mg/dL — ABNORMAL HIGH (ref 65–99)
POTASSIUM: 4.2 mmol/L (ref 3.5–5.2)
Sodium: 136 mmol/L (ref 134–144)

## 2017-01-19 MED ORDER — PROPRANOLOL HCL 10 MG PO TABS: 10.0000 mg | ORAL_TABLET | Freq: Four times a day (QID) | ORAL | 3 refills | Status: DC

## 2017-01-19 MED ORDER — FUROSEMIDE 20 MG PO TABS: 20.0000 mg | ORAL_TABLET | Freq: Every day | ORAL | 3 refills | Status: DC

## 2017-01-19 MED ORDER — METOPROLOL SUCCINATE ER 50 MG PO TB24: ORAL_TABLET | ORAL | 3 refills | Status: DC

## 2017-01-19 NOTE — Telephone Encounter (Signed)
[facility] is calling to verify the instructions for the metoprolol medication. Please call at [phone]. Thanks.

## 2017-01-19 NOTE — Progress Notes (Signed)
Julia [name] Date of Birth  20-Nov-1971       [facility]    [place] [facility] [address]  [address], [address] [place], [place]  [address]   [place], [place]  [id number] [phone]     [phone]   Fax  [phone]     Fax [phone]  Problem List: 1. Supraventricular tachycardia 2. Hypertension  3. Diabetes mellitus  History of Present Illness:  Julia [name] is seen today for follow up of SVT. See her in the past for evaluation of some chest discomfort. She normal stress echocardiogram in 2010. She had normal left ventricular systolic function at that time.  She is here to follow up for an episode of SVT.   It started suddenly while at work.  She had been drinking lots of diet mountain dew.  ( has since cut back). She had stopped her metformin but ha restarted.    She has had lots of palpitations  Nov. 17, 2015:  Athenia has continued to have palpitations .  Takes an extra metoprolol on occasion.    Seems to be related to her periods. Glucoses are better.   Still working on diet.    Dec. 9, 2016: Had an SVT ablation in March with Dr. Ladona Ridgelaylor. Metoprolol was stopped at that time .   Feb. 16, 2018:  Julia [name] is seen today after a 1 year visit  She has seen Vin in the meanwhile  Event monitor shows NSR with PVCs .    Is not exercising .    Has been traveling lots with work Marcina Millard( [place])   Current Outpatient Prescriptions on File Prior to Visit  Medication Sig Dispense Refill  . betamethasone valerate lotion (VALISONE) 0.1 % as directed.    . Clobetasol Propionate 0.05 % shampoo as directed.    . Dulaglutide (TRULICITY) 1.5 MG/0.5ML SOPN Inject 1.5 mg into the skin once a week. 12 mL 1  . Fluocinolone Acetonide Scalp 0.01 % OIL as directed.    . fluticasone (FLONASE) 50 MCG/ACT nasal spray Place 2 sprays into both nostrils daily.  6  . furosemide (LASIX) 20 MG tablet TAKE 1 TABLET (20 MG TOTAL) BY MOUTH DAILY. 90 tablet 1  . glucose blood (ONE TOUCH  ULTRA TEST) test strip USE AS DIRECTED 300 each 3  . glucose blood test strip Use as instructed 100 each 12  . metFORMIN (GLUCOPHAGE) 1000 MG tablet TAKE 1 TABLET TWICE DAILY  WITH MEALS 60 tablet 0  . triamcinolone cream (KENALOG) 0.1 % Apply 1 application topically daily as needed. For rash    . metoprolol succinate (TOPROL-XL) 50 MG 24 hr tablet Take 1 tablet (50 mg total) by mouth daily. Take with or immediately following a meal. 90 tablet 3   No current facility-administered medications on file prior to visit.     No Known Allergies  Past Medical History:  Diagnosis Date  . Complication of anesthesia    I had a reaction to a epidural "broke out"  . Condyloma acuminatum of vulva   . GERD (gastroesophageal reflux disease)   . History of supraventricular tachycardia   . HSV-1 infection   . HSV-2 infection   . HTN (hypertension)   . Hyperlipidemia   . Type 2 diabetes mellitus (HCC)     Past Surgical History:  Procedure Laterality Date  . CARDIOVASCULAR STRESS TEST  06-04-2007   normal nuclear study/  no ischemia/  normal LVF, ef 63%  .  CESAREAN SECTION  09-06-2005  . LASER ABLATION CONDOLAMATA N/A 10/08/2015   Procedure: CO2 LASER ABLATION CONDOLAMATA OF VULVA;  Surgeon: Richarda Overlie, MD;  Location: [facility];  Service: Gynecology;  Laterality: N/A;  . SUPRAVENTRICULAR TACHYCARDIA ABLATION N/A 02/24/2015   Procedure: SUPRAVENTRICULAR TACHYCARDIA ABLATION;  Surgeon: Marinus Maw, MD;  Location: [facility] CATH LAB;  Service: Cardiovascular;  Laterality: N/A;  . TRANSTHORACIC ECHOCARDIOGRAM  01-06-2009   normal LVF, ef 55-60%,  trace MR and TR/  normal stress echo    History  Smoking Status  . Former Smoker  . Years: 8.00  . Types: Cigarettes  . Quit date: 12/16/2002  Smokeless Tobacco  . Never Used    History  Alcohol Use  . 1.0 oz/week  . 2 Standard drinks or equivalent per week    Comment: rare    Family History  Problem Relation Age of Onset  .  Hypertension Mother   . Thyroid disease Mother   . Diabetes Mother   . Thyroid disease Sister   . Diabetes Maternal Grandmother   . Hyperlipidemia Other   . Thyroid disease Other     Reviw of Systems:  Reviewed in the HPI.  All other systems are negative.  Physical Exam: Blood pressure (!) 146/92, pulse 100, height 5\' 4"  (1.626 m), weight 178 lb 1.9 oz (80.8 kg), SpO2 100 %. Wt Readings from Last 3 Encounters:  01/19/17 178 lb 1.9 oz (80.8 kg)  10/02/16 172 lb (78 kg)  08/18/16 168 lb (76.2 kg)     General: Well developed, well nourished, in no acute distress.  Head: Normocephalic, atraumatic, sclera non-icteric, mucus membranes are moist,   Neck: Supple. Carotids are 2 + without bruits. No JVD   Lungs: Clear   Heart: RR, normla s1S2  Abdomen: Soft, non-tender, non-distended with normal bowel sounds.  Msk:  Strength and tone are normal   Extremities: No clubbing or cyanosis. No edema.  Distal pedal pulses are 2+ and equal    Neuro: CN II - XII intact.  Alert and oriented X 3.   Psych:  Normal   ECG: In the ER .  Narrow complex tachycardia ( SVT) at a rate of 220 ECG #2 - sinus tach at 115.   Assessment / Plan:   1. Supraventricular tachycardia- status post ablation She now has benign sinus tachycardia. She's on Toprol XL 50 mg a day. She continues to have some rare palpitations. We will increase her Toprol-XL to 70 50 g a day. I will also give her a prescription for propranolol to take 4 times a day as needed. Her event monitor revealed normal sinus rhythm with occasional premature ventricular contractions.  2. Hypertension - continue current meds   3. Diabetes mellitus - encouraged weight loss and exercise   4. Fatigue/shortness of breath: She continues to have episodes of fatigue and shortness breath. Her last echocardiogram was 8 years ago. We will repeat her echocardiogram. I'll see her again in 6 months for follow-up visit.   [name], MD    01/19/2017 8:32 AM    [facility] [address] [place],  [address] [place], [place]  [address] Pager [phone] Phone: [phone]; Fax: [phone]

## 2017-01-19 NOTE — Patient Instructions (Signed)
Medication Instructions:  Your physician has recommended you make the following change in your medication:  1. Refilled Lasix (20 mg ) daily 2. Increase Toprol (75 mg ) one and one half tablet daily 3. Propranolol (10 mg) 4 times daily as needed    Labwork: Your physician recommends that you have lab work today: bmet   Testing/Procedures: Your physician has requested that you have an echocardiogram. Echocardiography is a painless test that uses sound waves to create images of your heart. It provides your doctor with information about the size and shape of your heart and how well your heart's chambers and valves are working. This procedure takes approximately one hour. There are no restrictions for this procedure.    Follow-Up: Your physician wants you to follow-up in: 6 months with Dr. Elease HashimotoNahser.  You will receive a reminder letter in the mail two months in advance. If you don't receive a letter, please call our office to schedule the follow-up appointment.   Any Other Special Instructions Will Be Listed Below (If Applicable).     If you need a refill on your cardiac medications before your next appointment, please call your pharmacy.

## 2017-01-23 MED ORDER — METOPROLOL SUCCINATE ER 50 MG PO TB24: ORAL_TABLET | ORAL | 3 refills | Status: DC

## 2017-01-23 NOTE — Telephone Encounter (Signed)
Spoke with [name] at [facility] to clarify prescriptions. Toprol XL is ordered for 75 mg daily in addition to propranolol 10 mg QID up to 4 times daily as needed. She thanked me for the call

## 2017-01-24 ENCOUNTER — Other Ambulatory Visit: Payer: Self-pay | Admitting: Physician Assistant

## 2017-01-24 DIAGNOSIS — E1165 Type 2 diabetes mellitus with hyperglycemia: Principal | ICD-10-CM

## 2017-01-24 DIAGNOSIS — IMO0001 Reserved for inherently not codable concepts without codable children: Secondary | ICD-10-CM

## 2017-02-07 ENCOUNTER — Ambulatory Visit (HOSPITAL_COMMUNITY): Payer: 59 | Attending: Cardiology

## 2017-02-07 DIAGNOSIS — I361 Nonrheumatic tricuspid (valve) insufficiency: Secondary | ICD-10-CM | POA: Diagnosis not present

## 2017-02-07 DIAGNOSIS — I471 Supraventricular tachycardia: Secondary | ICD-10-CM

## 2017-02-07 DIAGNOSIS — R002 Palpitations: Secondary | ICD-10-CM | POA: Diagnosis not present

## 2017-02-28 ENCOUNTER — Other Ambulatory Visit: Payer: Self-pay | Admitting: *Deleted

## 2017-02-28 DIAGNOSIS — R002 Palpitations: Secondary | ICD-10-CM

## 2017-02-28 DIAGNOSIS — I471 Supraventricular tachycardia: Secondary | ICD-10-CM

## 2017-02-28 MED ORDER — METOPROLOL SUCCINATE ER 50 MG PO TB24: ORAL_TABLET | ORAL | 3 refills | Status: DC

## 2017-02-28 NOTE — Telephone Encounter (Signed)
Telephone   01/19/2017 [facility] [address] [facility]  Vesta MixerPhilip J Nahser, MD  Cardiology   Palpitations +1 more  Dx   Medication Problem  Reason for call   Conversation: Medication Problem  (Newest Message First)  January 23, 2017  Levi AlandMichelle M Swinyer, RN      8:54 AM  Note    Spoke with [name] at [facility] to clarify prescriptions. Toprol XL is ordered for 75 mg daily in addition to propranolol 10 mg QID up to 4 times daily as needed. She thanked me for the call          8:52 AM    Levi AlandMichelle M Swinyer, RN contacted [facility] (Other)  January 19, 2017        9:26 AM  Lana FishLashonda D [place] routed this conversation to Levi AlandMichelle M Swinyer, RN  Lana FishLashonda D [place]      9:24 AM  Note    [facility] is calling to verify the instructions for the metoprolol medication. Please call at [phone]. Thanks.          9:23 AM    [facility] (Other) contacted Lana FishLashonda D [place]

## 2017-03-02 DIAGNOSIS — J0100 Acute maxillary sinusitis, unspecified: Secondary | ICD-10-CM | POA: Diagnosis not present

## 2017-03-02 DIAGNOSIS — J209 Acute bronchitis, unspecified: Secondary | ICD-10-CM | POA: Diagnosis not present

## 2017-03-15 ENCOUNTER — Other Ambulatory Visit: Payer: Self-pay | Admitting: *Deleted

## 2017-03-15 DIAGNOSIS — R002 Palpitations: Secondary | ICD-10-CM

## 2017-03-15 DIAGNOSIS — I471 Supraventricular tachycardia: Secondary | ICD-10-CM

## 2017-03-15 MED ORDER — PROPRANOLOL HCL 10 MG PO TABS: 10.0000 mg | ORAL_TABLET | Freq: Four times a day (QID) | ORAL | 3 refills | Status: DC | PRN

## 2017-03-15 NOTE — Telephone Encounter (Signed)
Telephone   01/19/2017 [facility]  Vesta Mixer, MD  Cardiology   Palpitations +1 more  Dx   Medication Problem  Reason for call   Conversation: Medication Problem  (Newest Message First)  January 23, 2017  Levi Aland, RN      8:54 AM  Note    Spoke with [name] at [facility] to clarify prescriptions. Toprol XL is ordered for 75 mg daily in addition to propranolol 10 mg QID up to 4 times daily as needed. She thanked me for the call          8:52 AM    Levi Aland, RN contacted [facility] (Other)  January 19, 2017        9:26 AM  [name] routed this conversation to Levi Aland, RN  [name]      9:24 AM  Note    [facility] is calling to verify the instructions for the metoprolol medication. Please call at [phone]. Thanks.

## 2017-03-19 MED ORDER — PROPRANOLOL HCL 10 MG PO TABS: 10.0000 mg | ORAL_TABLET | Freq: Four times a day (QID) | ORAL | 3 refills | Status: DC | PRN

## 2017-03-19 NOTE — Addendum Note (Signed)
Addended by: Demetrios Loll on: 03/19/2017 12:24 PM   Modules accepted: Orders

## 2017-05-25 ENCOUNTER — Encounter: Payer: Self-pay | Admitting: Physician Assistant

## 2017-05-25 ENCOUNTER — Ambulatory Visit (INDEPENDENT_AMBULATORY_CARE_PROVIDER_SITE_OTHER): Payer: 59 | Admitting: Physician Assistant

## 2017-05-25 VITALS — BP 135/89 | HR 89 | Temp 98.5°F | Resp 16 | Ht 64.0 in | Wt 180.6 lb

## 2017-05-25 DIAGNOSIS — L02611 Cutaneous abscess of right foot: Secondary | ICD-10-CM | POA: Diagnosis not present

## 2017-05-25 DIAGNOSIS — L03031 Cellulitis of right toe: Secondary | ICD-10-CM

## 2017-05-25 MED ORDER — CEPHALEXIN 500 MG PO CAPS: 500.0000 mg | ORAL_CAPSULE | Freq: Three times a day (TID) | ORAL | 0 refills | Status: AC

## 2017-05-25 NOTE — Progress Notes (Signed)
Julia [name]  MRN: [id number] DOB: [birth year]  PCP: Sherren Mocha, MD  Chief Complaint  Patient presents with  . Foot Swelling    right big toe infected    Subjective:  Pt presents to clinic for right great toe - she had a manicure about a week ago but she felt like she had an ingrown toenail so 4 days ago she manipulated the tissue and got an open wound and then she started to noticed erythema and some pain.  The erythema has gotten worse in the last 24h.  She has been using warm epsom salt soaks and H2O2 and neosporin to help prevent infection.  She has DM but has good feeling in her feet/toes.  Review of Systems  Constitutional: Negative for chills and fever.  Skin: Positive for wound.    Patient Active Problem List   Diagnosis Date Noted  . Hyperlipidemia 07/28/2016  . Uncontrolled type 2 diabetes mellitus without complication, without long-term current use of insulin (HCC) 04/15/2016  . Sinus tachycardia 11/12/2015  . S/P radiofrequency ablation operation for arrhythmia 02/24/2015  . SVT (supraventricular tachycardia) (HCC) 08/13/2014  . Palpitations 08/13/2014  . HTN (hypertension) 08/01/2012    Current Outpatient Prescriptions on File Prior to Visit  Medication Sig Dispense Refill  . betamethasone valerate lotion (VALISONE) 0.1 % as directed.    . Clobetasol Propionate 0.05 % shampoo as directed.    . Dulaglutide (TRULICITY) 1.5 MG/0.5ML SOPN Inject 1.5 mg into the skin once a week. 12 mL 1  . Fluocinolone Acetonide Scalp 0.01 % OIL as directed.    . fluticasone (FLONASE) 50 MCG/ACT nasal spray Place 2 sprays into both nostrils daily.  6  . glucose blood (ONE TOUCH ULTRA TEST) test strip USE AS DIRECTED 300 each 3  . glucose blood test strip Use as instructed 100 each 12  . metFORMIN (GLUCOPHAGE) 1000 MG tablet Take 1 tablet twice daily with meals (need visit for further refills 2nd notice) 30 tablet 0  . metoprolol succinate (TOPROL-XL) 50 MG 24 hr tablet Take 1.5 (75  mg) tablets by mouth daily. 135 tablet 3  . propranolol (INDERAL) 10 MG tablet Take 1 tablet (10 mg total) by mouth 4 (four) times daily as needed. 360 tablet 3  . triamcinolone cream (KENALOG) 0.1 % Apply 1 application topically daily as needed. For rash    . furosemide (LASIX) 20 MG tablet TAKE 1 TABLET (20 MG TOTAL) BY MOUTH DAILY. 90 tablet 1  . furosemide (LASIX) 20 MG tablet Take 1 tablet (20 mg total) by mouth daily. 90 tablet 3   No current facility-administered medications on file prior to visit.     Allergies  Allergen Reactions  . Sulfur Other (See Comments)    blisters    Pt patients past, family and social history were reviewed and updated.   Objective:  BP 135/89 (BP Location: Left Arm, Patient Position: Sitting, Cuff Size: Large)   Pulse 89   Temp 98.5 F (36.9 C) (Oral)   Resp 16   Ht 5\' 4"  (1.626 m)   Wt 180 lb 9.6 oz (81.9 kg)   LMP 05/20/2017   SpO2 97%   BMI 31.00 kg/m   Physical Exam  Constitutional: She is oriented to person, place, and time and well-developed, well-nourished, and in no distress.  HENT:  Head: Normocephalic and atraumatic.  Right Ear: Hearing and external ear normal.  Left Ear: Hearing and external ear normal.  Eyes: Conjunctivae are normal.  Neck:  Normal range of motion.  Pulmonary/Chest: Effort normal.  Neurological: She is alert and oriented to person, place, and time. Gait normal.  Skin: Skin is warm and dry. There is erythema.  Medial aspect of right great toe- erythema - wound at cuticle - nail edge is seen the entire way - no ingrown nail seen - pt is tender at the cuticle wound and not at the nail edge  Psychiatric: Mood, memory, affect and judgment normal.  Vitals reviewed.   Assessment and Plan :  Abscess or cellulitis of toe, right - Plan: cephALEXin (KEFLEX) 500 MG capsule - in addition the patient has a wound - we will cover with abx - she will continue to do saoks and keep covered - she will f/u in 48h if no  better.  Benny LennertSarah Chadrick Sprinkle PA-C  [facility] 05/25/2017 3:42 PM

## 2017-05-25 NOTE — Patient Instructions (Signed)
     IF you received an x-ray today, you will receive an invoice from [facility]. Please contact [facility] at [phone] with questions or concerns regarding your invoice.   IF you received labwork today, you will receive an invoice from [facility]. Please contact [facility] at [phone] with questions or concerns regarding your invoice.   Our billing staff will not be able to assist you with questions regarding bills from these companies.  You will be contacted with the lab results as soon as they are available. The fastest way to get your results is to activate your My Chart account. Instructions are located on the last page of this paperwork. If you have not heard from us regarding the results in 2 weeks, please contact this office.    We recommend that you schedule a mammogram for breast cancer screening. Typically, you do not need a referral to do this. Please contact a local imaging center to schedule your mammogram.  [facility] - [phone]  *ask for the Radiology Department [facility]) - [phone] or [phone]  [facility] - [phone] [facility] - [phone] [facility] - [phone]  *ask for the Radiology Department [facility] - [phone]  *ask for the Radiology Department [facility] - [phone]  *ask for the Mammography Department [facility] - [phone] 

## 2017-06-09 ENCOUNTER — Encounter: Payer: Self-pay | Admitting: Family Medicine

## 2017-06-09 ENCOUNTER — Ambulatory Visit (INDEPENDENT_AMBULATORY_CARE_PROVIDER_SITE_OTHER): Payer: 59 | Admitting: Family Medicine

## 2017-06-09 VITALS — BP 110/84 | HR 96 | Temp 98.8°F | Resp 16 | Ht 64.0 in | Wt 180.8 lb

## 2017-06-09 DIAGNOSIS — R002 Palpitations: Secondary | ICD-10-CM

## 2017-06-09 DIAGNOSIS — E1165 Type 2 diabetes mellitus with hyperglycemia: Secondary | ICD-10-CM

## 2017-06-09 DIAGNOSIS — E7800 Pure hypercholesterolemia, unspecified: Secondary | ICD-10-CM

## 2017-06-09 DIAGNOSIS — Z5181 Encounter for therapeutic drug level monitoring: Secondary | ICD-10-CM

## 2017-06-09 DIAGNOSIS — R1013 Epigastric pain: Secondary | ICD-10-CM | POA: Diagnosis not present

## 2017-06-09 DIAGNOSIS — F39 Unspecified mood [affective] disorder: Secondary | ICD-10-CM

## 2017-06-09 DIAGNOSIS — R140 Abdominal distension (gaseous): Secondary | ICD-10-CM

## 2017-06-09 DIAGNOSIS — E049 Nontoxic goiter, unspecified: Secondary | ICD-10-CM | POA: Diagnosis not present

## 2017-06-09 DIAGNOSIS — R7989 Other specified abnormal findings of blood chemistry: Secondary | ICD-10-CM

## 2017-06-09 DIAGNOSIS — R946 Abnormal results of thyroid function studies: Secondary | ICD-10-CM

## 2017-06-09 DIAGNOSIS — IMO0001 Reserved for inherently not codable concepts without codable children: Secondary | ICD-10-CM

## 2017-06-09 DIAGNOSIS — I471 Supraventricular tachycardia: Secondary | ICD-10-CM

## 2017-06-09 DIAGNOSIS — R197 Diarrhea, unspecified: Secondary | ICD-10-CM | POA: Diagnosis not present

## 2017-06-09 LAB — POCT GLYCOSYLATED HEMOGLOBIN (HGB A1C): Hemoglobin A1C: 13.6

## 2017-06-09 MED ORDER — METOPROLOL SUCCINATE ER 50 MG PO TB24: 50.0000 mg | ORAL_TABLET | Freq: Every day | ORAL | 3 refills | Status: DC

## 2017-06-09 MED ORDER — OMEPRAZOLE 40 MG PO CPDR: 40.0000 mg | DELAYED_RELEASE_CAPSULE | Freq: Every day | ORAL | 3 refills | Status: DC

## 2017-06-09 MED ORDER — FUROSEMIDE 20 MG PO TABS: ORAL_TABLET | ORAL | 1 refills | Status: DC

## 2017-06-09 MED ORDER — DULAGLUTIDE 1.5 MG/0.5ML ~~LOC~~ SOAJ: 1.5000 mg | SUBCUTANEOUS | 1 refills | Status: DC

## 2017-06-09 MED ORDER — METFORMIN HCL 1000 MG PO TABS: 1000.0000 mg | ORAL_TABLET | Freq: Two times a day (BID) | ORAL | 3 refills | Status: DC

## 2017-06-09 MED ORDER — SERTRALINE HCL 50 MG PO TABS: ORAL_TABLET | ORAL | 2 refills | Status: DC

## 2017-06-09 NOTE — Patient Instructions (Addendum)
     IF you received an x-ray today, you will receive an invoice from [facility]. Please contact [facility] at [phone] with questions or concerns regarding your invoice.   IF you received labwork today, you will receive an invoice from [facility]. Please contact [facility] at [phone] with questions or concerns regarding your invoice.   Our billing staff will not be able to assist you with questions regarding bills from these companies.  You will be contacted with the lab results as soon as they are available. The fastest way to get your results is to activate your My Chart account. Instructions are located on the last page of this paperwork. If you have not heard from us regarding the results in 2 weeks, please contact this office.    We recommend that you schedule a mammogram for breast cancer screening. Typically, you do not need a referral to do this. Please contact a local imaging center to schedule your mammogram.  [facility] - [phone]  *ask for the Radiology Department [facility]) - [phone] or [phone]  [facility] - [phone] [facility] - [phone] [facility] - [phone]  *ask for the Radiology Department [facility] - [phone]  *ask for the Radiology Department [facility] - [phone]  *ask for the Mammography Department [facility] - [phone] 

## 2017-06-09 NOTE — Progress Notes (Addendum)
By signing my name below, I, Mesha Guinyard, attest that this documentation has been prepared under the direction and in the presence of Norberto Sorenson, MD.  Electronically Signed: Arvilla Market, Medical Scribe. 06/09/17. 8:27 AM.  Subjective:    Patient ID: Julia [name], female    DOB: 07-05-1971, 46 y.o.   MRN: [id number]  HPI Chief Complaint  Patient presents with  . Follow-up    DIABETES  . Medication Refill    DULAGLUTIDE,LASIX,METFORMIN,TOPROL-XL  . Depression    PATIENT WILL DISCUSS    HPI Comments: Julia [name] is a 46 y.o. female who presents to Primary Care at [facility] complaining of multiple concerns including medication refill, DM follow-up, and depression. Pt is fasting.  DM: She takes metformin 1000 mg BID, and trulicity. Pt has been running out of metformin so she's been taking it QD for the past 6 weeks, and she hasn't been compliant with trulicity. Pt has not been checking her blood sugar. States her blood sugar has been bad for the past 4-5 months and suspects it's from work related stress. Lab Results  Component Value Date   HGBA1C 7.6 07/28/2016   Lab Results  Component Value Date   MICROALBUR <0.2 07/28/2016   Right great toe: She reports 2.5 weeks ago she "operated" on it by peeling it with bleeding afterwards and has been applying neosporin on it. States it's a "little sore with pressure" and it looks much better than it did, but it continues to bleed. Denies pus draining. She plans on going to the beach in 2 weeks.  Abdominal Pain: States after eating she has to "run to" the bathroom with associated sxs of diarrhea, abdominal bloat, and RUQ abdominal pain. Pt still has her gallbladder. Denies taking heartburn medication since she's never had GERD. Denies hematuria, dysuria, urinary frequency, urinary urgency, or other urinary sxs  HTN: She takes lasix 20 mg QD, and toprol-xl 75 mg QD. Pt would like to get a refill of toprol-xl and has been taking 50 mg PRN.  Reports feet swelling. BP Readings from Last 3 Encounters:  06/09/17 110/84  05/25/17 135/89  01/19/17 (!) 146/92   Lab Results  Component Value Date   CREATININE 0.61 01/19/2017   Depression: Reports gaining weight. States there is a lot going on at work and she's been "eating her feelings" to deal with "a lot of depression and a lot of sadness". Pt was on depression 4 years ago but it made her eat more than usual. Pt no longer wants to go out and she sees herself as a outgoing person who goes to socials.  Depression screen [facility] 2/9 06/09/2017 05/25/2017 08/18/2016 08/08/2016 07/28/2016  Decreased Interest 1 0 0 0 0  Down, Depressed, Hopeless 1 0 0 0 0  PHQ - 2 Score 2 0 0 0 0  Altered sleeping 0 - - - -  Tired, decreased energy 1 - - - -  Change in appetite 3 - - - -  Feeling bad or failure about yourself  1 - - - -  Trouble concentrating 0 - - - -  Moving slowly or fidgety/restless 0 - - - -  Suicidal thoughts 0 - - - -  PHQ-9 Score 7 - - - -   Wt Readings from Last 3 Encounters:  06/09/17 180 lb 12.8 oz (82 kg)  05/25/17 180 lb 9.6 oz (81.9 kg)  01/19/17 178 lb 1.9 oz (80.8 kg)   Patient Active Problem List   Diagnosis  Date Noted  . Hyperlipidemia 07/28/2016  . Uncontrolled type 2 diabetes mellitus without complication, without long-term current use of insulin (HCC) 04/15/2016  . Sinus tachycardia 11/12/2015  . S/P radiofrequency ablation operation for arrhythmia 02/24/2015  . SVT (supraventricular tachycardia) (HCC) 08/13/2014  . Palpitations 08/13/2014  . HTN (hypertension) 08/01/2012   Past Medical History:  Diagnosis Date  . Complication of anesthesia    I had a reaction to a epidural "broke out"  . Condyloma acuminatum of vulva   . GERD (gastroesophageal reflux disease)   . History of supraventricular tachycardia   . HSV-1 infection   . HSV-2 infection   . HTN (hypertension)   . Hyperlipidemia   . Type 2 diabetes mellitus (HCC)    Past Surgical History:    Procedure Laterality Date  . CARDIOVASCULAR STRESS TEST  06-04-2007   normal nuclear study/  no ischemia/  normal LVF, ef 63%  . CESAREAN SECTION  09-06-2005  . LASER ABLATION CONDOLAMATA N/A 10/08/2015   Procedure: CO2 LASER ABLATION CONDOLAMATA OF VULVA;  Surgeon: Richarda Overlieichard Holland, MD;  [facility] [place];  Service: Gynecology;  Laterality: N/A;  . SUPRAVENTRICULAR TACHYCARDIA ABLATION N/A 02/24/2015   Procedure: SUPRAVENTRICULAR TACHYCARDIA ABLATION;  Surgeon: Marinus MawGregg W Taylor, MD;  Location: [facility] CATH LAB;  Service: Cardiovascular;  Laterality: N/A;  . TRANSTHORACIC ECHOCARDIOGRAM  01-06-2009   normal LVF, ef 55-60%,  trace MR and TR/  normal stress echo   Allergies  Allergen Reactions  . Sulfur Other (See Comments)    blisters   Prior to Admission medications   Medication Sig Start Date End Date Taking? Authorizing Provider  betamethasone valerate lotion (VALISONE) 0.1 % as directed. 08/14/16  Yes [provider]  Clobetasol Propionate 0.05 % shampoo as directed. 08/17/16  Yes [provider]  Dulaglutide (TRULICITY) 1.5 MG/0.5ML SOPN Inject 1.5 mg into the skin once a week. 07/28/16  Yes Sherren MochaShaw, Layton Tappan N, MD  Fluocinolone Acetonide Scalp 0.01 % OIL as directed. 08/17/16  Yes [provider]  fluticasone (FLONASE) 50 MCG/ACT nasal spray Place 2 sprays into both nostrils daily. 03/12/15  Yes [provider]  furosemide (LASIX) 20 MG tablet TAKE 1 TABLET (20 MG TOTAL) BY MOUTH DAILY. 07/28/16  Yes Sherren MochaShaw, Delania Ferg N, MD  metFORMIN (GLUCOPHAGE) 1000 MG tablet Take 1 tablet twice daily with meals (need visit for further refills 2nd notice) 01/24/17  Yes Ofilia Neaslark, Michael L, PA-C  metoprolol succinate (TOPROL-XL) 50 MG 24 hr tablet Take 1.5 (75 mg) tablets by mouth daily. 02/28/17  Yes Nahser, Deloris PingPhilip J, MD  propranolol (INDERAL) 10 MG tablet Take 1 tablet (10 mg total) by mouth 4 (four) times daily as needed. 03/19/17  Yes Nahser, Deloris PingPhilip J, MD  triamcinolone cream  (KENALOG) 0.1 % Apply 1 application topically daily as needed. For rash   Yes [provider]  furosemide (LASIX) 20 MG tablet Take 1 tablet (20 mg total) by mouth daily. 01/19/17 04/19/17  Nahser, Deloris PingPhilip J, MD  glucose blood (ONE TOUCH ULTRA TEST) test strip USE AS DIRECTED 06/16/16   Sherren MochaShaw, Arnell Slivinski N, MD  glucose blood test strip Use as instructed 04/15/16   Dorna LeitzBush, Nicole V, PA-C   Social History   Social History  . Marital status: Married    Spouse name: N/A  . Number of children: N/A  . Years of education: N/A   Occupational History  . Not on file.   Social History Main Topics  . Smoking status: Former Smoker    Years:  8.00    Types: Cigarettes    Quit date: 12/16/2002  . Smokeless tobacco: Never Used  . Alcohol use 1.0 oz/week    2 Standard drinks or equivalent per week     Comment: rare  . Drug use: No  . Sexual activity: Yes    Birth control/ protection: None   Other Topics Concern  . Not on file   Social History Narrative  . No narrative on file   Review of Systems  Constitutional: Positive for appetite change (more eating) and unexpected weight change (gain).  Cardiovascular:       Positive feet swelling  Gastrointestinal: Positive for abdominal distention, abdominal pain and diarrhea.  Genitourinary: Negative for difficulty urinating, dysuria, frequency, hematuria and urgency.  Skin: Positive for color change and wound.  Psychiatric/Behavioral: Positive for dysphoric mood. The patient is nervous/anxious.    Objective:  Physical Exam  Constitutional: She appears well-developed and well-nourished. No distress.  HENT:  Head: Normocephalic and atraumatic.  Eyes: Conjunctivae are normal.  Neck: Neck supple. Thyromegaly present. No thyroid mass present.  Cardiovascular: Normal rate, regular rhythm, S1 normal, S2 normal and normal heart sounds.  Exam reveals no gallop and no friction rub.   No murmur heard. Pulmonary/Chest: Effort normal and breath sounds normal.  No respiratory distress. She has no wheezes. She has no rhonchi. She has no rales.  Abdominal: Bowel sounds are normal. There is tenderness in the epigastric area. There is no rebound and no guarding.  Lymphadenopathy:    She has no cervical adenopathy.  Neurological: She is alert.  Skin: Skin is warm and dry.  Right great toe medial aspect with thickening of the subungual nail Some erythema and peeling of the skin extending medially and approx with sanguineous drainage with palpations and mild tenderness  Psychiatric: She has a normal mood and affect. Her behavior is normal.  Nursing note and vitals reviewed.   Vitals:   06/09/17 0811 06/09/17 0821  BP: 120/90 110/84  Pulse: 96   Resp: 16   Temp: 98.8 F (37.1 C)   TempSrc: Oral   SpO2: 97%   Weight: 180 lb 12.8 oz (82 kg)   Height: 5\' 4"  (1.626 m)   Body mass index is 31.03 kg/m.   Results for orders placed or performed in visit on 06/09/17  POCT glycosylated hemoglobin (Hb A1C)  Result Value Ref Range   Hemoglobin A1C 13.6    Assessment & Plan:   1. Uncontrolled type 2 diabetes mellitus without complication, without long-term current use of insulin (HCC) - pt has been noncompliant and off her metformin for sev mos (which she was only taking half dose and has been only taking trulicity periodically. States she is motivated to resume compliance. Hopefully if the antidepressant helps her mood this will encourage her as well. Needs to keep follow-up office visits.   2. Pure hypercholesterolemia   3. Medication monitoring encounter   4. Abnormal thyroid blood test - TSH mildly elevated prior. Recheck today. Low threshold to start supplement as may help weight and mood   5. Episodic mood disorder (HCC) - tried for several weeks on Wellbutrin and lamotrigine prior with unknown side effects leading to stop soon. Citalopram caused weight gain. Encouraged patient to use her worked [employer] to start therapy. Start trial of sertraline.   6.  Palpitations - currently well controlled. When necessary propranolol   7. SVT (supraventricular tachycardia) (HCC) - continue toprol 50 which is what patient has actually been taking.  8. Abdominal bloating - sxs consistent with gallstones - check labs and RUQ [place].   9. Diarrhea, unspecified type   10. Abdominal pain, epigastric - check h. Pylori and start ppi  11. Enlarged thyroid - Did have thyroid ultrasound by ENT Dr. Margit Banda in 2011 showed slight prominence of the thyroid gland with a single benign calcification in right lower lobe. Consider repeating ultrasound. Discuss with patient follow-up visit.   12.    Paronychia - warm compresses, top mupirocin. If no improvement in 3-4d, then start keflex 500 qid since immunosupressed with uncontrolled DM  Orders Placed This Encounter  Procedures  . US Abdomen Limited RUQ    Standing Status:   Future    Standing Expiration Date:   08/10/2018    Order Specific Question:   Reason for Exam (SYMPTOM  OR DIAGNOSIS REQUIRED)    Answer:   symptoms of cholelithiasis    Order Specific Question:   Preferred imaging location?    Answer:   [facility]  . Comprehensive metabolic panel    Order Specific Question:   Has the patient fasted?    Answer:   Yes  . Lipid panel    Order Specific Question:   Has the patient fasted?    Answer:   Yes  . Microalbumin/Creatinine Ratio, Urine  . Thyroid Panel With TSH  . H. pylori breath test  . Care order/instruction:    Scheduling Instructions:     Complete orders, AVS and go.  Marland Kitchen POCT glycosylated hemoglobin (Hb A1C)  . HM DIABETES FOOT EXAM    Meds ordered this encounter  Medications  . Dulaglutide (TRULICITY) 1.5 MG/0.5ML SOPN    Sig: Inject 1.5 mg into the skin once a week.    Dispense:  12 mL    Refill:  1  . metFORMIN (GLUCOPHAGE) 1000 MG tablet    Sig: Take 1 tablet (1,000 mg total) by mouth 2 (two) times daily with a meal.    Dispense:  180 tablet    Refill:  3  . furosemide (LASIX) 20 MG  tablet    Sig: TAKE 1 TABLET (20 MG TOTAL) BY MOUTH DAILY.    Dispense:  90 tablet    Refill:  1  . metoprolol succinate (TOPROL-XL) 50 MG 24 hr tablet    Sig: Take 1 tablet (50 mg total) by mouth daily.    Dispense:  90 tablet    Refill:  3  . sertraline (ZOLOFT) 50 MG tablet    Sig: Take 1/2 tab po qd x 10d, then 1 tab po qd    Dispense:  30 tablet    Refill:  2  . omeprazole (PRILOSEC) 40 MG capsule    Sig: Take 1 capsule (40 mg total) by mouth daily.    Dispense:  30 capsule    Refill:  3   Over 40 min spent in face-to-face evaluation of and consultation with patient and coordination of care.  Over 50% of this time was spent counseling this patient.  I personally performed the services described in this documentation, which was scribed in my presence. The recorded information has been reviewed and considered, and addended by me as needed.   Norberto Sorenson, M.D.  Primary Care at [facility] [address] [place], [place] [address] [phone] phone [phone] fax  06/09/17 11:05 AM

## 2017-06-10 LAB — COMPREHENSIVE METABOLIC PANEL
ALBUMIN: 4.2 g/dL (ref 3.5–5.5)
ALK PHOS: 162 IU/L — AB (ref 39–117)
ALT: 18 IU/L (ref 0–32)
AST: 15 IU/L (ref 0–40)
Albumin/Globulin Ratio: 1.3 (ref 1.2–2.2)
BILIRUBIN TOTAL: 0.4 mg/dL (ref 0.0–1.2)
BUN / CREAT RATIO: 23 (ref 9–23)
BUN: 15 mg/dL (ref 6–24)
CHLORIDE: 93 mmol/L — AB (ref 96–106)
CO2: 21 mmol/L (ref 20–29)
Calcium: 9.1 mg/dL (ref 8.7–10.2)
Creatinine, Ser: 0.64 mg/dL (ref 0.57–1.00)
GFR calc Af Amer: 123 mL/min/{1.73_m2} (ref >59)
GFR calc non Af Amer: 107 mL/min/{1.73_m2} (ref >59)
GLOBULIN, TOTAL: 3.3 g/dL (ref 1.5–4.5)
Glucose: 313 mg/dL — ABNORMAL HIGH (ref 65–99)
POTASSIUM: 4.1 mmol/L (ref 3.5–5.2)
SODIUM: 132 mmol/L — AB (ref 134–144)
Total Protein: 7.5 g/dL (ref 6.0–8.5)

## 2017-06-10 LAB — LIPID PANEL
CHOLESTEROL TOTAL: 197 mg/dL (ref 100–199)
Chol/HDL Ratio: 6.8 ratio — ABNORMAL HIGH (ref 0.0–4.4)
HDL: 29 mg/dL — ABNORMAL LOW (ref >39)
Triglycerides: 640 mg/dL (ref 0–149)

## 2017-06-10 LAB — MICROALBUMIN / CREATININE URINE RATIO
Creatinine, Urine: 80.1 mg/dL
MICROALB/CREAT RATIO: 21.1 mg/g{creat} (ref 0.0–30.0)
MICROALBUM., U, RANDOM: 16.9 ug/mL

## 2017-06-10 LAB — THYROID PANEL WITH TSH
Free Thyroxine Index: 1.7 (ref 1.2–4.9)
T3 UPTAKE RATIO: 25 % (ref 24–39)
T4 TOTAL: 6.6 ug/dL (ref 4.5–12.0)
TSH: 7.57 u[IU]/mL — AB (ref 0.450–4.500)

## 2017-06-11 LAB — H. PYLORI BREATH TEST: H pylori Breath Test: NEGATIVE

## 2017-06-12 ENCOUNTER — Encounter: Payer: Self-pay | Admitting: Family Medicine

## 2017-06-15 ENCOUNTER — Telehealth: Payer: Self-pay | Admitting: Family Medicine

## 2017-06-15 ENCOUNTER — Telehealth: Payer: Self-pay

## 2017-06-15 ENCOUNTER — Encounter: Payer: Self-pay | Admitting: Family Medicine

## 2017-06-15 ENCOUNTER — Other Ambulatory Visit: Payer: Self-pay

## 2017-06-15 MED ORDER — CEPHALEXIN 500 MG PO CAPS: 500.0000 mg | ORAL_CAPSULE | Freq: Four times a day (QID) | ORAL | 0 refills | Status: DC

## 2017-06-15 MED ORDER — PANTOPRAZOLE SODIUM 40 MG PO TBEC: 40.0000 mg | DELAYED_RELEASE_TABLET | Freq: Every day | ORAL | 1 refills | Status: DC

## 2017-06-15 MED ORDER — MUPIROCIN 2 % EX OINT: 1.0000 "application " | TOPICAL_OINTMENT | Freq: Four times a day (QID) | CUTANEOUS | 0 refills | Status: DC

## 2017-06-15 NOTE — Telephone Encounter (Signed)
PT IS VERY UPSET WITH THIS PRACTICE STATES THAT SHE HAS SENT SEVERAL MESSAGES TO DR Clelia CroftSHAW SHE IS WANTING A RX FOR AN ANTIBIOTIC FOR HER TOE SHE IS GOING OUT OF TOWN TODAY PLEASE RESPOND

## 2017-06-15 NOTE — Telephone Encounter (Signed)
Pt called requesting rx for protonix. Dr Clelia CroftShaw gave verbal order. Sent to pharmacy. Pt aware.

## 2017-06-15 NOTE — Telephone Encounter (Signed)
Pt advised.

## 2017-06-15 NOTE — Telephone Encounter (Signed)
Antibiotic sent.  Recommend office visit for such an issue alone in the future rather than tagging it on to all of the other numerous additional issues we covered - then would not get forgotten in the midst of much more pressing issues to her health.

## 2017-06-15 NOTE — Addendum Note (Signed)
Addended by: Sherren MochaSHAW, EVA N on: 06/15/2017 10:41 AM   Modules accepted: Level of Service

## 2017-06-21 IMAGING — DX DG FINGER INDEX 2+V*L*
3 series · 3 of 3 positions shown · non-contrast
Comparison: None.

CLINICAL DATA: Closed door on finger.  Pain and swelling

EXAM:
LEFT INDEX FINGER 2+V

[finger ap]
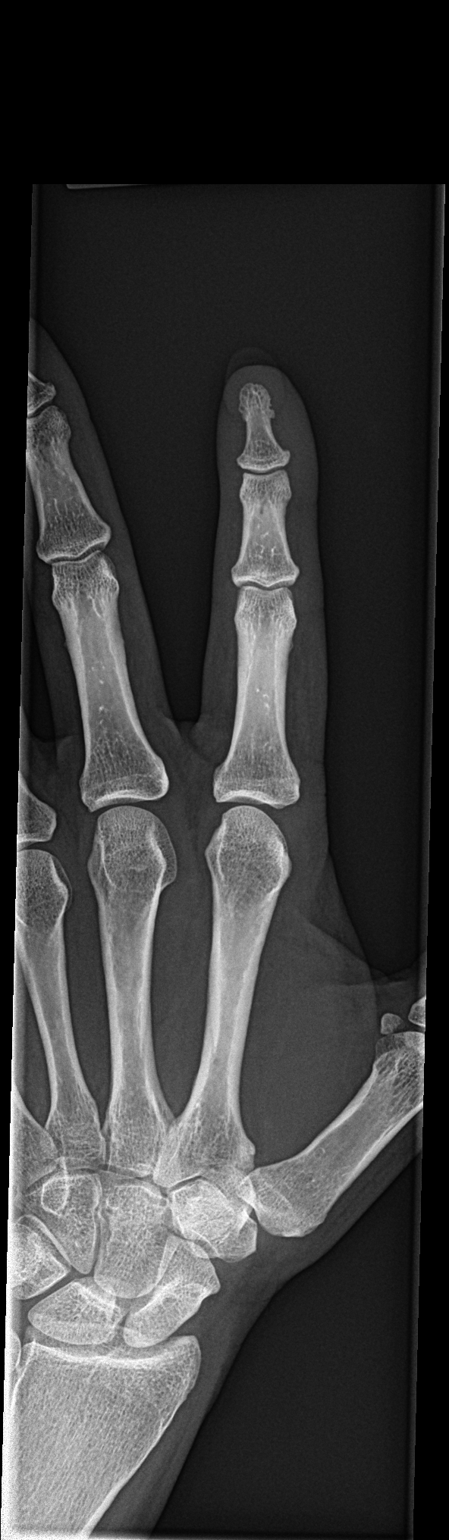

[finger obl]
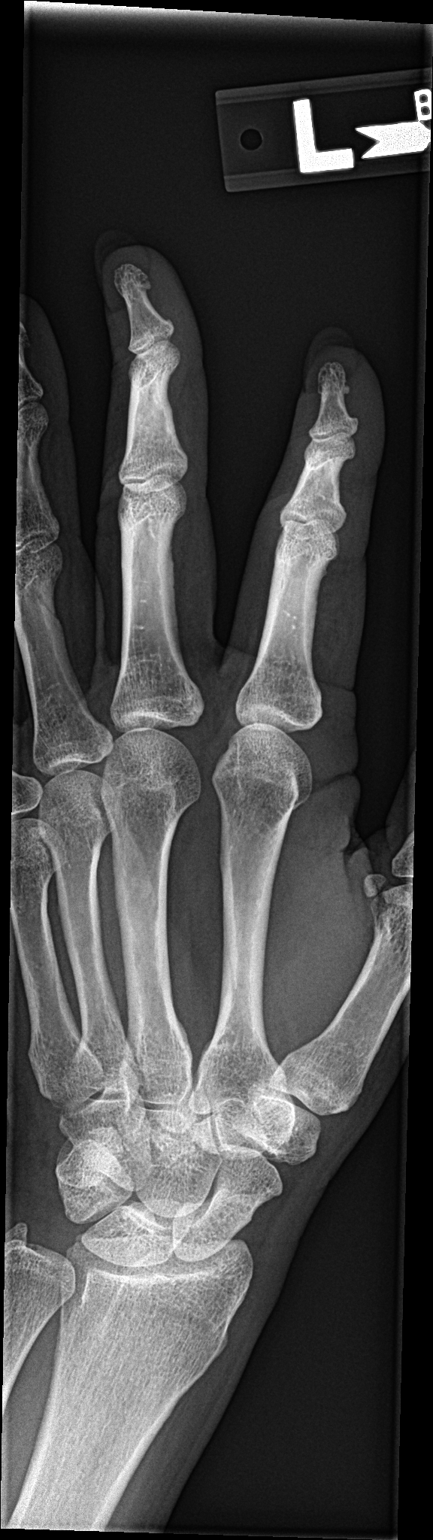

[finger lat]
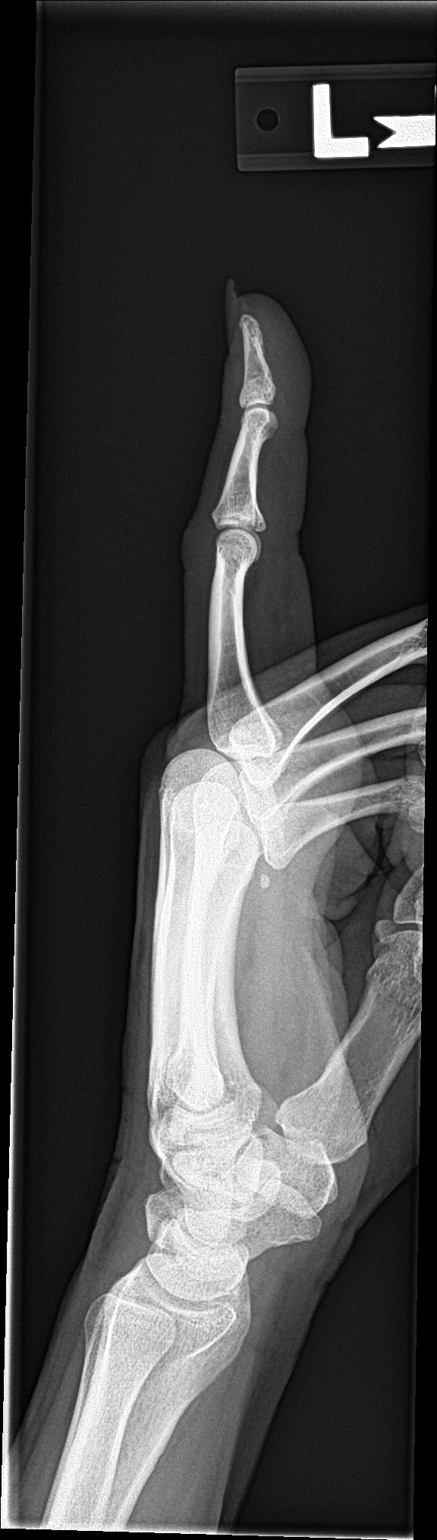

[3 of 3 positions shown; findings below may reference images not displayed]

FINDINGS: Fracture involving the base of the the second middle phalanx
extending into the PIP joint. Fracture involves the volar plate. No
significant displacement. No joint space narrowing.
IMPRESSION: Fracture at the base of the second middle phalanx extending into the
PIP joint.

## 2017-07-03 ENCOUNTER — Ambulatory Visit
Admission: RE | Admit: 2017-07-03 | Discharge: 2017-07-03 | Disposition: A | Discharge status: Home / Self Care | Payer: 59 | Source: Ambulatory Visit | Attending: Family Medicine | Admitting: Family Medicine

## 2017-07-03 DIAGNOSIS — R140 Abdominal distension (gaseous): Secondary | ICD-10-CM

## 2017-07-03 DIAGNOSIS — K8020 Calculus of gallbladder without cholecystitis without obstruction: Secondary | ICD-10-CM | POA: Diagnosis not present

## 2017-07-03 DIAGNOSIS — R1013 Epigastric pain: Secondary | ICD-10-CM

## 2017-07-03 DIAGNOSIS — R197 Diarrhea, unspecified: Secondary | ICD-10-CM

## 2017-07-27 ENCOUNTER — Other Ambulatory Visit: Payer: Self-pay | Admitting: Family Medicine

## 2017-08-01 ENCOUNTER — Ambulatory Visit (INDEPENDENT_AMBULATORY_CARE_PROVIDER_SITE_OTHER): Payer: 59 | Admitting: Family Medicine

## 2017-08-01 ENCOUNTER — Encounter: Payer: Self-pay | Admitting: Family Medicine

## 2017-08-01 VITALS — BP 147/96 | HR 91 | Temp 99.0°F | Resp 17 | Ht 63.5 in | Wt 183.0 lb

## 2017-08-01 DIAGNOSIS — J0140 Acute pansinusitis, unspecified: Secondary | ICD-10-CM | POA: Diagnosis not present

## 2017-08-01 DIAGNOSIS — E1165 Type 2 diabetes mellitus with hyperglycemia: Secondary | ICD-10-CM

## 2017-08-01 DIAGNOSIS — R000 Tachycardia, unspecified: Secondary | ICD-10-CM | POA: Diagnosis not present

## 2017-08-01 DIAGNOSIS — IMO0001 Reserved for inherently not codable concepts without codable children: Secondary | ICD-10-CM

## 2017-08-01 DIAGNOSIS — R7989 Other specified abnormal findings of blood chemistry: Secondary | ICD-10-CM

## 2017-08-01 DIAGNOSIS — R946 Abnormal results of thyroid function studies: Secondary | ICD-10-CM | POA: Diagnosis not present

## 2017-08-01 DIAGNOSIS — R002 Palpitations: Secondary | ICD-10-CM | POA: Diagnosis not present

## 2017-08-01 DIAGNOSIS — I471 Supraventricular tachycardia: Secondary | ICD-10-CM | POA: Diagnosis not present

## 2017-08-01 DIAGNOSIS — E038 Other specified hypothyroidism: Secondary | ICD-10-CM

## 2017-08-01 DIAGNOSIS — E7800 Pure hypercholesterolemia, unspecified: Secondary | ICD-10-CM

## 2017-08-01 DIAGNOSIS — I10 Essential (primary) hypertension: Secondary | ICD-10-CM

## 2017-08-01 DIAGNOSIS — R600 Localized edema: Secondary | ICD-10-CM | POA: Diagnosis not present

## 2017-08-01 DIAGNOSIS — E049 Nontoxic goiter, unspecified: Secondary | ICD-10-CM

## 2017-08-01 DIAGNOSIS — E039 Hypothyroidism, unspecified: Secondary | ICD-10-CM | POA: Diagnosis not present

## 2017-08-01 LAB — POCT CBC
GRANULOCYTE PERCENT: 66.4 % (ref 37–80)
HEMATOCRIT: 42.7 % (ref 37.7–47.9)
HEMOGLOBIN: 14.5 g/dL (ref 12.2–16.2)
Lymph, poc: 1.9 (ref 0.6–3.4)
MCH: 27.4 pg (ref 27–31.2)
MCHC: 33.9 g/dL (ref 31.8–35.4)
MCV: 80.7 fL (ref 80–97)
MID (CBC): 0.3 (ref 0–0.9)
MPV: 7.4 fL (ref 0–99.8)
POC GRANULOCYTE: 4.4 (ref 2–6.9)
POC LYMPH PERCENT: 28.6 %L (ref 10–50)
POC MID %: 5 % (ref 0–12)
Platelet Count, POC: 272 10*3/uL (ref 142–424)
RBC: 5.28 M/uL (ref 4.04–5.48)
RDW, POC: 13.7 %
WBC: 6.6 10*3/uL (ref 4.6–10.2)

## 2017-08-01 LAB — POCT URINALYSIS DIP (MANUAL ENTRY)
Bilirubin, UA: NEGATIVE
Blood, UA: NEGATIVE
Ketones, POC UA: NEGATIVE mg/dL
LEUKOCYTES UA: NEGATIVE
Nitrite, UA: NEGATIVE
PROTEIN UA: NEGATIVE mg/dL
SPEC GRAV UA: 1.01 (ref 1.010–1.025)
UROBILINOGEN UA: 0.2 U/dL
pH, UA: 7 (ref 5.0–8.0)

## 2017-08-01 MED ORDER — LEVOTHYROXINE SODIUM 75 MCG PO TABS: 75.0000 ug | ORAL_TABLET | Freq: Every day | ORAL | 0 refills | Status: DC

## 2017-08-01 MED ORDER — SITAGLIPTIN PHOSPHATE 100 MG PO TABS: 100.0000 mg | ORAL_TABLET | Freq: Every day | ORAL | 1 refills | Status: DC

## 2017-08-01 MED ORDER — AMOXICILLIN-POT CLAVULANATE 875-125 MG PO TABS: 1.0000 | ORAL_TABLET | Freq: Two times a day (BID) | ORAL | 0 refills | Status: DC

## 2017-08-01 MED ORDER — ATORVASTATIN CALCIUM 20 MG PO TABS: 20.0000 mg | ORAL_TABLET | Freq: Every day | ORAL | 0 refills | Status: DC

## 2017-08-01 MED ORDER — CETIRIZINE HCL 10 MG PO TABS: 10.0000 mg | ORAL_TABLET | Freq: Every day | ORAL | 11 refills | Status: DC

## 2017-08-01 MED ORDER — POTASSIUM CHLORIDE CRYS ER 20 MEQ PO TBCR: 20.0000 meq | EXTENDED_RELEASE_TABLET | Freq: Every day | ORAL | 3 refills | Status: DC

## 2017-08-01 MED ORDER — METOPROLOL SUCCINATE ER 50 MG PO TB24: 50.0000 mg | ORAL_TABLET | Freq: Every day | ORAL | 3 refills | Status: DC

## 2017-08-01 MED ORDER — METOPROLOL SUCCINATE ER 100 MG PO TB24: 100.0000 mg | ORAL_TABLET | Freq: Every day | ORAL | 3 refills | Status: DC

## 2017-08-01 MED ORDER — ATENOLOL-CHLORTHALIDONE 100-25 MG PO TABS: 1.0000 | ORAL_TABLET | Freq: Every day | ORAL | 0 refills | Status: DC

## 2017-08-01 MED ORDER — FUROSEMIDE 40 MG PO TABS: 40.0000 mg | ORAL_TABLET | Freq: Every day | ORAL | 3 refills | Status: DC

## 2017-08-01 NOTE — Progress Notes (Addendum)
Subjective:  By signing my name below, I, Essence Howell, attest that this documentation has been prepared under the direction and in the presence of Norberto Sorenson, MD Electronically Signed: Charline Bills, ED Scribe 08/01/2017 at 4:55 PM.   Patient ID: Julia [name], female    DOB: [birth year], 45 y.o.   MRN: [id number]  Chief Complaint  Patient presents with  . Hypertension   HPI Julia [name] is a 46 y.o. female who presents to Primary Care at [facility] for elevated BP. Seen 6 weeks prior. Takes Lasix 20 and Toprol 50. Pt reports an episode where she bent over to get towels out of the dryer and felt "blood rushing" to her head/increase in pressure a few days ago. States she slowly stood, bent forward again to retrieve the towels and noticed the same sensation in her head. Pt checked her BP which was 166/113, layed down and took another Toprol and reported a diastolic reading of 84 within 4 hours. States she rechecked her BP yesterday with a reading of 130/92 after experiencing a "full" sensation in her head and sinus pressure which she has been treating with Flonase. Denies dizziness or HA.   Leg Swelling Pt reports swelling to her hands and ankles, right worse than left, over the past few weeks. States she sas been eliminating salt from her diet; reports eating canned chicken and 10 blue chips for lunch and consuming plenty of fluids. States her mother is convinced swelling is due to 1000 mg Metformin bid. Pt stopped Trulicity 3 weeks ago due to abdominal pain which has since resolved.   Pt was on Invokana which was discontinued 04/2016 as there was concern it was contributing to rash of photosensitivity, dermatitis, possible drug rash with blisters on foot. She did see her dermatologist who did a biopsy and suspected that it was a photosensitive rash due to either of those medications. Wide variety of autoimmune diseases in family.  Past Medical History:  Diagnosis Date  . Complication of anesthesia    I had a reaction to a epidural "broke out"  . Condyloma acuminatum of vulva   . GERD (gastroesophageal reflux disease)   . History of supraventricular tachycardia   . HSV-1 infection   . HSV-2 infection   . HTN (hypertension)   . Hyperlipidemia   . Type 2 diabetes mellitus (HCC)    Current Outpatient Prescriptions on File Prior to Visit  Medication Sig Dispense Refill  . fluticasone (FLONASE) 50 MCG/ACT nasal spray Place 2 sprays into both nostrils daily.  6  . furosemide (LASIX) 20 MG tablet TAKE 1 TABLET (20 MG TOTAL) BY MOUTH DAILY. 90 tablet 1  . glucose blood (ONE TOUCH ULTRA TEST) test strip USE AS DIRECTED 300 each 3  . glucose blood test strip Use as instructed 100 each 12  . metFORMIN (GLUCOPHAGE) 1000 MG tablet Take 1 tablet (1,000 mg total) by mouth 2 (two) times daily with a meal. 180 tablet 3  . metoprolol succinate (TOPROL-XL) 50 MG 24 hr tablet Take 1 tablet (50 mg total) by mouth daily. 90 tablet 3  . pantoprazole (PROTONIX) 40 MG tablet Take 1 tablet (40 mg total) by mouth daily. 90 tablet 1  . propranolol (INDERAL) 10 MG tablet Take 1 tablet (10 mg total) by mouth 4 (four) times daily as needed. 360 tablet 3  . TRULICITY 1.5 MG/0.5ML SOPN INJECT 1.5 MG INTO THE SKIN ONCE A WEEK. 12 pen 1   No current facility-administered medications on file  prior to visit.    Allergies  Allergen Reactions  . Sulfur Other (See Comments)    blisters   Past Surgical History:  Procedure Laterality Date  . CARDIOVASCULAR STRESS TEST  06-04-2007   normal nuclear study/  no ischemia/  normal LVF, ef 63%  . CESAREAN SECTION  09-06-2005  . LASER ABLATION CONDOLAMATA N/A 10/08/2015   Procedure: CO2 LASER ABLATION CONDOLAMATA OF VULVA;  Surgeon: Richarda Overlie, MD;  Location: [facility];  Service: Gynecology;  Laterality: N/A;  . SUPRAVENTRICULAR TACHYCARDIA ABLATION N/A 02/24/2015   Procedure: SUPRAVENTRICULAR TACHYCARDIA ABLATION;  Surgeon: Marinus Maw, MD;  Location:  [facility] CATH LAB;  Service: Cardiovascular;  Laterality: N/A;  . TRANSTHORACIC ECHOCARDIOGRAM  01-06-2009   normal LVF, ef 55-60%,  trace MR and TR/  normal stress echo   Family History  Problem Relation Age of Onset  . Hypertension Mother   . Thyroid disease Mother   . Diabetes Mother   . Thyroid disease Sister   . Diabetes Maternal Grandmother   . Hyperlipidemia Other   . Thyroid disease Other    Social History   Social History  . Marital status: Married    Spouse name: N/A  . Number of children: N/A  . Years of education: N/A   Social History Main Topics  . Smoking status: Former Smoker    Years: 8.00    Types: Cigarettes    Quit date: 12/16/2002  . Smokeless tobacco: Never Used  . Alcohol use 1.0 oz/week    2 Standard drinks or equivalent per week     Comment: rare  . Drug use: No  . Sexual activity: Yes    Birth control/ protection: None   Other Topics Concern  . None   Social History Narrative  . None   Depression screen [facility] 2/9 08/01/2017 06/09/2017 05/25/2017 08/18/2016 08/08/2016  Decreased Interest 0 1 0 0 0  Down, Depressed, Hopeless 0 1 0 0 0  PHQ - 2 Score 0 2 0 0 0  Altered sleeping - 0 - - -  Tired, decreased energy - 1 - - -  Change in appetite - 3 - - -  Feeling bad or failure about yourself  - 1 - - -  Trouble concentrating - 0 - - -  Moving slowly or fidgety/restless - 0 - - -  Suicidal thoughts - 0 - - -  PHQ-9 Score - 7 - - -    Review of Systems  HENT: Positive for sinus pressure.   Cardiovascular: Positive for leg swelling.  Gastrointestinal: Negative for abdominal pain.  Neurological: Negative for dizziness and headaches.      Objective:   Physical Exam  Constitutional: She is oriented to person, place, and time. She appears well-developed and well-nourished. No distress.  HENT:  Head: Normocephalic and atraumatic.  Right Ear: Tympanic membrane is retracted.  Left Ear: Tympanic membrane is injected and retracted.  Mouth/Throat:  Oropharynx is clear and moist and mucous membranes are normal.  Nares: erythema. Irritated mucosa  Eyes: Conjunctivae and EOM are normal.  Neck: Neck supple. No tracheal deviation present.  Cardiovascular: Regular rhythm, S1 normal, S2 normal and normal heart sounds.  Tachycardia present.   No murmur heard. Pulses:      Dorsalis pedis pulses are 2+ on the right side, and 2+ on the left side.  Pulmonary/Chest: Effort normal. No respiratory distress.  Musculoskeletal: Normal range of motion. She exhibits edema.  Trace pedal edema.  Neurological: She is  alert and oriented to person, place, and time.  Skin: Skin is warm and dry.  Psychiatric: She has a normal mood and affect. Her behavior is normal.  Nursing note and vitals reviewed.  Results for orders placed or performed in visit on 08/01/17  POCT urinalysis dipstick  Result Value Ref Range   Color, UA yellow yellow   Clarity, UA clear clear   Glucose, UA =250 (A) negative mg/dL   Bilirubin, UA negative negative   Ketones, POC UA negative negative mg/dL   Spec Grav, UA 1.610 9.604 - 1.025   Blood, UA negative negative   pH, UA 7.0 5.0 - 8.0   Protein Ur, POC negative negative mg/dL   Urobilinogen, UA 0.2 0.2 or 1.0 E.U./dL   Nitrite, UA Negative Negative   Leukocytes, UA Negative Negative  POCT CBC  Result Value Ref Range   WBC 6.6 4.6 - 10.2 K/uL   Lymph, poc 1.9 0.6 - 3.4   POC LYMPH PERCENT 28.6 10 - 50 %L   MID (cbc) 0.3 0 - 0.9   POC MID % 5.0 0 - 12 %M   POC Granulocyte 4.4 2 - 6.9   Granulocyte percent 66.4 37 - 80 %G   RBC 5.28 4.04 - 5.48 M/uL   Hemoglobin 14.5 12.2 - 16.2 g/dL   HCT, POC 54.0 98.1 - 47.9 %   MCV 80.7 80 - 97 fL   MCH, POC 27.4 27 - 31.2 pg   MCHC 33.9 31.8 - 35.4 g/dL   RDW, POC 19.1 %   Platelet Count, POC 272 142 - 424 K/uL   MPV 7.4 0 - 99.8 fL   BP (!) 147/96   Pulse 91   Temp 99 F (37.2 C) (Oral)   Resp 17   Ht 5' 3.5" (1.613 m)   Wt 183 lb (83 kg)   LMP 07/04/2017 (Approximate)    SpO2 98%   BMI 31.91 kg/m    Assessment & Plan:   1. Pedal edema - increase lasix 20 to 40 and start K.  Poss due to worsening hypothyroid?   2. Palpitations   3. Essential hypertension - HR is always elev - lifelong per pt - on toprol xl 50 qd - cont - considered increasing to 100 or changing to atenolol for more BP lowering effect.  Had poss photosensitive rash from hctz so will hold off on trial of chlorthalidone as well.  4. Sinus tachycardia   5. Uncontrolled type 2 diabetes mellitus without complication, without long-term current use of insulin (HCC) - had severe abd pain with trulicity for many mos. Had poss photosensitive rash with jardiance. Need to avoid med w/ weight gain so will try DPP4 inh but want to ensure abd pain has resolved and liver normalized since starting lipitor.  Will refer to endocrine for further help in mngmnt.  6. Acute non-recurrent pansinusitis - poss cause of HA/dizziness/presyncope - start augmentin and zyrtec. Freq nasal saline and netti wash  7. Abnormal TSH   8. SVT (supraventricular tachycardia) (HCC)   9. Goiter - check [place]  10. Subclinical hypothyroidism - worsening - poss cause of edema?  Start trial of levothyroxine 75 - recheck in 6-8 wks  11.    HLD - start lipitor 20 - unable to calc LDL due to high trig and LFTs elev likely due to lipids - recheck LFTs in 6-8 wks   Orders Placed This Encounter  Procedures  . US THYROID    Epic order Wt.  180/ no needs Ins- [facility]c Pda/pt     Standing Status:   Future    Standing Expiration Date:   10/01/2018    Order Specific Question:   Reason for Exam (SYMPTOM  OR DIAGNOSIS REQUIRED)    Answer:   increasing size of thyroid    Order Specific Question:   Preferred imaging location?    Answer:   [facility]  . Comprehensive metabolic panel  . Thyroid Panel With TSH  . Thyroid antibodies  . Thyroid Panel With TSH  . Thyroid antibodies  . Ambulatory referral to Endocrinology    Referral Priority:    Routine    Referral Type:   Consultation    Referral Reason:   Specialty Services Required    Number of Visits Requested:   1  . Orthostatic vital signs  . POCT urinalysis dipstick  . POCT CBC    . DISCONTD: metoprolol succinate (TOPROL-XL) 100 MG 24 hr tablet    Sig: Take 1 tablet (100 mg total) by mouth daily. Take with or immediately following a meal.    Dispense:  90 tablet    Refill:  3  . furosemide (LASIX) 40 MG tablet    Sig: Take 1 tablet (40 mg total) by mouth daily.    Dispense:  30 tablet    Refill:  3  . potassium chloride SA (K-DUR,KLOR-CON) 20 MEQ tablet    Sig: Take 1 tablet (20 mEq total) by mouth daily.    Dispense:  30 tablet    Refill:  3  . DISCONTD: sitaGLIPtin (JANUVIA) 100 MG tablet    Sig: Take 1 tablet (100 mg total) by mouth daily.    Dispense:  90 tablet    Refill:  1  . cetirizine (ZYRTEC) 10 MG tablet    Sig: Take 1 tablet (10 mg total) by mouth at bedtime.    Dispense:  30 tablet    Refill:  11  . amoxicillin-clavulanate (AUGMENTIN) 875-125 MG tablet    Sig: Take 1 tablet by mouth 2 (two) times daily.    Dispense:  14 tablet    Refill:  0  . levothyroxine (SYNTHROID, LEVOTHROID) 75 MCG tablet    Sig: Take 1 tablet (75 mcg total) by mouth daily.    Dispense:  90 tablet    Refill:  0  . atorvastatin (LIPITOR) 20 MG tablet    Sig: Take 1 tablet (20 mg total) by mouth daily.    Dispense:  90 tablet    Refill:  0  . metoprolol succinate (TOPROL-XL) 50 MG 24 hr tablet    Sig: Take 1 tablet (50 mg total) by mouth daily.    Dispense:  90 tablet    Refill:  3    Please d/c prior orders I sent over today for toprol xl 100, atenolol-chlorthalidone, and januvia   Over 40 min spent in face-to-face evaluation of and consultation with patient and coordination of care.  Over 50% of this time was spent counseling this patient regarding edema, DM control, HTN, med regimen.  I personally performed the services described in this documentation, which was  scribed in my presence. The recorded information has been reviewed and considered, and addended by me as needed.   Norberto Sorenson, M.D.  Primary Care at [facility] [address] [place], [place] [address] [phone] phone [phone] fax  08/05/17 2:06 AM

## 2017-08-01 NOTE — Patient Instructions (Addendum)
     IF you received an x-ray today, you will receive an invoice from [facility]. Please contact [facility] at [phone] with questions or concerns regarding your invoice.   IF you received labwork today, you will receive an invoice from [facility]. Please contact [facility] at [phone] with questions or concerns regarding your invoice.   Our billing staff will not be able to assist you with questions regarding bills from these companies.  You will be contacted with the lab results as soon as they are available. The fastest way to get your results is to activate your My Chart account. Instructions are located on the last page of this paperwork. If you have not heard from us regarding the results in 2 weeks, please contact this office.      Hypothyroidism Hypothyroidism is a disorder of the thyroid. The thyroid is a large gland that is located in the lower front of the neck. The thyroid releases hormones that control how the body works. With hypothyroidism, the thyroid does not make enough of these hormones. What are the causes? Causes of hypothyroidism may include:  Viral infections.  Pregnancy.  Your own defense system (immune system) attacking your thyroid.  Certain medicines.  Birth defects.  Past radiation treatments to your head or neck.  Past treatment with radioactive iodine.  Past surgical removal of part or all of your thyroid.  Problems with the gland that is located in the center of your brain (pituitary). What are the signs or symptoms? Signs and symptoms of hypothyroidism may include:  Feeling as though you have no energy (lethargy).  Inability to tolerate cold.  Weight gain that is not explained by a change in diet or exercise habits.  Dry skin.  Coarse hair.  Menstrual irregularity.  Slowing of thought processes.  Constipation.  Sadness or depression. How is this diagnosed? Your health care provider may diagnose  hypothyroidism with blood tests and ultrasound tests. How is this treated? Hypothyroidism is treated with medicine that replaces the hormones that your body does not make. After you begin treatment, it may take several weeks for symptoms to go away. Follow these instructions at home:  Take medicines only as directed by your health care provider.  If you start taking any new medicines, tell your health care provider.  Keep all follow-up visits as directed by your health care provider. This is important. As your condition improves, your dosage needs may change. You will need to have blood tests regularly so that your health care provider can watch your condition. Contact a health care provider if:  Your symptoms do not get better with treatment.  You are taking thyroid replacement medicine and:  You sweat excessively.  You have tremors.  You feel anxious.  You lose weight rapidly.  You cannot tolerate heat.  You have emotional swings.  You have diarrhea.  You feel weak. Get help right away if:  You develop chest pain.  You develop an irregular heartbeat.  You develop a rapid heartbeat. This information is not intended to replace advice given to you by your health care provider. Make sure you discuss any questions you have with your health care provider. Document Released: 11/20/2005 Document Revised: 04/27/2016 Document Reviewed: 04/07/2014 Elsevier Interactive Patient Education  2017 [employer] Inc[employer]  

## 2017-08-02 LAB — COMPREHENSIVE METABOLIC PANEL
ALT: 21 IU/L (ref 0–32)
AST: 22 IU/L (ref 0–40)
Albumin/Globulin Ratio: 1.7 (ref 1.2–2.2)
Albumin: 4.5 g/dL (ref 3.5–5.5)
Alkaline Phosphatase: 148 IU/L — ABNORMAL HIGH (ref 39–117)
BUN/Creatinine Ratio: 16 (ref 9–23)
BUN: 11 mg/dL (ref 6–24)
Bilirubin Total: 0.2 mg/dL (ref 0.0–1.2)
CALCIUM: 9.3 mg/dL (ref 8.7–10.2)
CO2: 24 mmol/L (ref 20–29)
Chloride: 90 mmol/L — ABNORMAL LOW (ref 96–106)
Creatinine, Ser: 0.68 mg/dL (ref 0.57–1.00)
GFR, EST AFRICAN AMERICAN: 120 mL/min/{1.73_m2} (ref >59)
GFR, EST NON AFRICAN AMERICAN: 105 mL/min/{1.73_m2} (ref >59)
GLUCOSE: 325 mg/dL — AB (ref 65–99)
Globulin, Total: 2.7 g/dL (ref 1.5–4.5)
Potassium: 4.2 mmol/L (ref 3.5–5.2)
Sodium: 134 mmol/L (ref 134–144)
TOTAL PROTEIN: 7.2 g/dL (ref 6.0–8.5)

## 2017-08-02 LAB — THYROID PANEL WITH TSH
Free Thyroxine Index: 1.5 (ref 1.2–4.9)
T3 Uptake Ratio: 24 % (ref 24–39)
T4 TOTAL: 6.2 ug/dL (ref 4.5–12.0)
TSH: 10.32 u[IU]/mL — AB (ref 0.450–4.500)

## 2017-08-02 LAB — THYROID ANTIBODIES
THYROID PEROXIDASE ANTIBODY: 12 [IU]/mL (ref 0–34)
Thyroglobulin Antibody: 2.7 IU/mL — ABNORMAL HIGH (ref 0.0–0.9)

## 2017-08-05 DIAGNOSIS — E039 Hypothyroidism, unspecified: Secondary | ICD-10-CM | POA: Insufficient documentation

## 2017-08-05 DIAGNOSIS — E038 Other specified hypothyroidism: Secondary | ICD-10-CM | POA: Insufficient documentation

## 2017-08-10 ENCOUNTER — Ambulatory Visit
Admission: RE | Admit: 2017-08-10 | Discharge: 2017-08-10 | Disposition: A | Discharge status: Home / Self Care | Payer: 59 | Source: Ambulatory Visit | Attending: Family Medicine | Admitting: Family Medicine

## 2017-08-10 DIAGNOSIS — E049 Nontoxic goiter, unspecified: Secondary | ICD-10-CM

## 2017-08-10 DIAGNOSIS — E010 Iodine-deficiency related diffuse (endemic) goiter: Secondary | ICD-10-CM | POA: Diagnosis not present

## 2017-08-10 DIAGNOSIS — R7989 Other specified abnormal findings of blood chemistry: Secondary | ICD-10-CM

## 2017-08-23 DIAGNOSIS — H1032 Unspecified acute conjunctivitis, left eye: Secondary | ICD-10-CM | POA: Diagnosis not present

## 2017-09-08 ENCOUNTER — Other Ambulatory Visit: Payer: Self-pay | Admitting: Family Medicine

## 2017-09-13 ENCOUNTER — Ambulatory Visit: Payer: 59 | Admitting: Family Medicine

## 2017-09-21 DIAGNOSIS — Z1231 Encounter for screening mammogram for malignant neoplasm of breast: Secondary | ICD-10-CM | POA: Diagnosis not present

## 2017-09-25 DIAGNOSIS — Z01419 Encounter for gynecological examination (general) (routine) without abnormal findings: Secondary | ICD-10-CM | POA: Diagnosis not present

## 2017-10-04 ENCOUNTER — Ambulatory Visit (INDEPENDENT_AMBULATORY_CARE_PROVIDER_SITE_OTHER): Payer: 59 | Admitting: Internal Medicine

## 2017-10-04 ENCOUNTER — Encounter: Payer: Self-pay | Admitting: Internal Medicine

## 2017-10-04 VITALS — BP 128/82 | HR 83 | Ht 64.0 in | Wt 183.0 lb

## 2017-10-04 DIAGNOSIS — Z6830 Body mass index (BMI) 30.0-30.9, adult: Secondary | ICD-10-CM

## 2017-10-04 DIAGNOSIS — IMO0001 Reserved for inherently not codable concepts without codable children: Secondary | ICD-10-CM

## 2017-10-04 DIAGNOSIS — E1165 Type 2 diabetes mellitus with hyperglycemia: Secondary | ICD-10-CM

## 2017-10-04 DIAGNOSIS — E669 Obesity, unspecified: Secondary | ICD-10-CM

## 2017-10-04 LAB — POCT GLYCOSYLATED HEMOGLOBIN (HGB A1C): HEMOGLOBIN A1C: 11.9

## 2017-10-04 MED ORDER — DULAGLUTIDE 0.75 MG/0.5ML ~~LOC~~ SOAJ: SUBCUTANEOUS | 1 refills | Status: DC

## 2017-10-04 MED ORDER — GLUCOSE BLOOD VI STRP: ORAL_STRIP | 3 refills | Status: AC

## 2017-10-04 MED ORDER — METFORMIN HCL 1000 MG PO TABS: 1000.0000 mg | ORAL_TABLET | Freq: Two times a day (BID) | ORAL | 3 refills | Status: DC

## 2017-10-04 NOTE — Patient Instructions (Addendum)
Please continue: - Metformin 1000 mg 2x a day  Please start Trulicity 0.75 mg weekly. Let me know when you are close to running out to call in the higher dose to your pharmacy (1.5 mg).  Please return in 1.5 months with your sugar log.   PATIENT INSTRUCTIONS FOR TYPE 2 DIABETES:  DIET AND EXERCISE Diet and exercise is an important part of diabetic treatment.  We recommended aerobic exercise in the form of brisk walking (working between 40-60% of maximal aerobic capacity, similar to brisk walking) for 150 minutes per week (such as 30 minutes five days per week) along with 3 times per week performing 'resistance' training (using various gauge rubber tubes with handles) 5-10 exercises involving the major muscle groups (upper body, lower body and core) performing 10-15 repetitions (or near fatigue) each exercise. Start at half the above goal but build slowly to reach the above goals. If limited by weight, joint pain, or disability, we recommend daily walking in a swimming pool with water up to waist to reduce pressure from joints while allow for adequate exercise.    BLOOD GLUCOSES Monitoring your blood glucoses is important for continued management of your diabetes. Please check your blood glucoses 2-4 times a day: fasting, before meals and at bedtime (you can rotate these measurements - e.g. one day check before the 3 meals, the next day check before 2 of the meals and before bedtime, etc.).   HYPOGLYCEMIA (low blood sugar) Hypoglycemia is usually a reaction to not eating, exercising, or taking too much insulin/ other diabetes drugs.  Symptoms include tremors, sweating, hunger, confusion, headache, etc. Treat IMMEDIATELY with 15 grams of Carbs: . 4 glucose tablets .  cup regular juice/soda . 2 tablespoons raisins . 4 teaspoons sugar . 1 tablespoon honey Recheck blood glucose in 15 mins and repeat above if still symptomatic/blood glucose <100.  RECOMMENDATIONS TO REDUCE YOUR RISK OF DIABETIC  COMPLICATIONS: * Take your prescribed MEDICATION(S) * Follow a DIABETIC diet: Complex carbs, fiber rich foods, (monounsaturated and polyunsaturated) fats * AVOID saturated/trans fats, high fat foods, >2,300 mg salt per day. * EXERCISE at least 5 times a week for 30 minutes or preferably daily.  * DO NOT SMOKE OR DRINK more than 1 drink a day. * Check your FEET every day. Do not wear tightfitting shoes. Contact us if you develop an ulcer * See your EYE doctor once a year or more if needed * Get a FLU shot once a year * Get a PNEUMONIA vaccine once before and once after age 14 years  GOALS:  * Your Hemoglobin A1c of <7%  * fasting sugars need to be <130 * after meals sugars need to be <180 (2h after you start eating) * Your Systolic BP should be 140 or lower  * Your Diastolic BP should be 80 or lower  * Your HDL (Good Cholesterol) should be 40 or higher  * Your LDL (Bad Cholesterol) should be 100 or lower. * Your Triglycerides should be 150 or lower  * Your Urine microalbumin (kidney function) should be <30 * Your Body Mass Index should be 25 or lower    Please consider the following ways to cut down carbs and fat and increase fiber and micronutrients in your diet: - substitute whole grain for white bread or pasta - substitute brown rice for white rice - substitute 90-calorie flat bread pieces for slices of bread when possible - substitute sweet potatoes or yams for white potatoes - substitute humus for  margarine - substitute tofu for cheese when possible - substitute almond or rice milk for regular milk (would not drink soy milk daily due to concern for soy estrogen influence on breast cancer risk) - substitute dark chocolate for other sweets when possible - substitute water - can add lemon or orange slices for taste - for diet sodas (artificial sweeteners will trick your body that you can eat sweets without getting calories and will lead you to overeating and weight gain in the long  run) - do not skip breakfast or other meals (this will slow down the metabolism and will result in more weight gain over time)  - can try smoothies made from fruit and almond/rice milk in am instead of regular breakfast - can also try old-fashioned (not instant) oatmeal made with almond/rice milk in am - order the dressing on the side when eating salad at a restaurant (pour less than half of the dressing on the salad) - eat as little meat as possible - can try juicing, but should not forget that juicing will get rid of the fiber, so would alternate with eating raw veg./fruits or drinking smoothies - use as little oil as possible, even when using olive oil - can dress a salad with a mix of balsamic vinegar and lemon juice, for e.g. - use agave nectar, stevia sugar, or regular sugar rather than artificial sweateners - steam or broil/roast veggies  - snack on veggies/fruit/nuts (unsalted, preferably) when possible, rather than processed foods - reduce or eliminate aspartame in diet (it is in diet sodas, chewing gum, etc) Read the labels!  Try to read Dr. Neal Barnard'Katherina Rights book: "Program for Reversing Diabetes" for other ideas for healthy eating. Please look up "The Engine 2 diet" by Juanetta Beetsip Esselstyn. (The 7 day rescue diet)

## 2017-10-04 NOTE — Progress Notes (Signed)
Patient ID: Julia [name], female   DOB: 11/20/1971, 46 y.o.   MRN: [id number]   HPI: Julia [name] is a 46 y.o.-year-old female, referred by her PCP, Dr. Clelia CroftShaw, for management of DM2, dx in 2001, non-insulin-dependent, uncontrolled, without long-term complications.  Last hemoglobin A1c was: Lab Results  Component Value Date   HGBA1C 13.6 06/09/2017   HGBA1C 7.6 07/28/2016   HGBA1C 12.0 04/15/2016  HbA1c was lowest while on Weight Watchers.  Pt is on a regimen of: - Metformin 1000 mg 2x a day, with meals She was on Trulicity, but stopped in 07/2017 because of abdominal pain She was on Invokana but felt that this gave her a rash (this was, however, diagnosed as photosensitivity per biopsy by dermatology) She was on Glipizide but developed blisters on feet, burning hands  Pt does not check sugars. - am: n/c - 2h after b'fast: n/c - before lunch: n/c - 2h after lunch: n/c - before dinner: n/c - 2h after dinner: n/c - bedtime: n/c - nighttime: n/c Lowest sugar was 160; she has hypoglycemia awareness ? Highest sugar was 300s.  Glucometer: One Touch  Pt's meals are: - Breakfast: bacon, egg, toast with mayo - Lunch: chicken or burger + french fries - Dinner: meat + veggies - Snacks: 2-3 - candy  - no CKD, last BUN/creatinine:  Lab Results  Component Value Date   BUN 11 08/01/2017   BUN 15 06/09/2017   CREATININE 0.68 08/01/2017   CREATININE 0.64 06/09/2017   Lab Results  Component Value Date   MICRALBCREAT SEE NOTE 07/28/2016   - + HL; last set of lipids: Lab Results  Component Value Date   CHOL 197 06/09/2017   HDL 29 (L) 06/09/2017   LDLCALC Comment 06/09/2017   TRIG 640 (HH) 06/09/2017   CHOLHDL 6.8 (H) 06/09/2017  On Lipitor 20. - last eye exam was in 04/2016. No DR.  - no numbness and tingling in her feet.  Pt has FH of DM in mother, 2 sisters, M grandmother.   ROS: Constitutional: + weight gain, + fatigue, no subjective hyperthermia/hypothermia Eyes: no  blurry vision, no xerophthalmia ENT: no sore throat, no nodules palpated in throat, no dysphagia/odynophagia, no hoarseness Cardiovascular: no CP/SOB/+ palpitations/ no leg swelling Respiratory: no cough/SOB Gastrointestinal: no N/V/D/C/+ heartburn Musculoskeletal: no muscle/joint aches Skin: no rashes Neurological: no tremors/numbness/tingling/dizziness Psychiatric: no depression/anxiety  Past Medical History:  Diagnosis Date  . Complication of anesthesia    I had a reaction to a epidural "broke out"  . Condyloma acuminatum of vulva   . GERD (gastroesophageal reflux disease)   . History of supraventricular tachycardia   . HSV-1 infection   . HSV-2 infection   . HTN (hypertension)   . Hyperlipidemia   . Type 2 diabetes mellitus (HCC)    Past Surgical History:  Procedure Laterality Date  . CARDIOVASCULAR STRESS TEST  06-04-2007   normal nuclear study/  no ischemia/  normal LVF, ef 63%  . CESAREAN SECTION  09-06-2005  . LASER ABLATION CONDOLAMATA N/A 10/08/2015   Procedure: CO2 LASER ABLATION CONDOLAMATA OF VULVA;  Surgeon: Richarda Overlieichard Holland, MD;  [facility];  Service: Gynecology;  Laterality: N/A;  . SUPRAVENTRICULAR TACHYCARDIA ABLATION N/A 02/24/2015   Procedure: SUPRAVENTRICULAR TACHYCARDIA ABLATION;  Surgeon: Marinus MawGregg W Taylor, MD;  [facility];  Service: Cardiovascular;  Laterality: N/A;  . TRANSTHORACIC ECHOCARDIOGRAM  01-06-2009   normal LVF, ef 55-60%,  trace MR and TR/  normal stress echo  Social History   Social History  . Marital status: Married    Spouse name: N/A  . Number of children: 1   Occupational History  . Manager, [employer]   Social History Main Topics  . Smoking status: Former Smoker    Years: 8.00    Types: Cigarettes    Quit date: 12/16/2002  . Smokeless tobacco: Never Used  . Alcohol use 1.0 oz/week    1 Standard drinks or equivalent per week     Comment: rare  . Drug use: No  . Sexual activity: Yes     Birth control/ protection: None   Current Outpatient Prescriptions on File Prior to Visit  Medication Sig Dispense Refill  . cetirizine (ZYRTEC) 10 MG tablet Take 1 tablet (10 mg total) by mouth at bedtime. (Patient taking differently: Take 10 mg by mouth as needed. ) 30 tablet 11  . fluticasone (FLONASE) 50 MCG/ACT nasal spray Place 2 sprays into both nostrils daily.  6  . furosemide (LASIX) 40 MG tablet Take 1 tablet (40 mg total) by mouth daily. 30 tablet 3  . glucose blood (ONE TOUCH ULTRA TEST) test strip USE AS DIRECTED 300 each 3  . glucose blood test strip Use as instructed 100 each 12  . levothyroxine (SYNTHROID, LEVOTHROID) 75 MCG tablet Take 1 tablet (75 mcg total) by mouth daily. 90 tablet 0  . metFORMIN (GLUCOPHAGE) 1000 MG tablet Take 1 tablet (1,000 mg total) by mouth 2 (two) times daily with a meal. 180 tablet 3  . potassium chloride SA (K-DUR,KLOR-CON) 20 MEQ tablet Take 1 tablet (20 mEq total) by mouth daily. 30 tablet 3  . propranolol (INDERAL) 10 MG tablet Take 1 tablet (10 mg total) by mouth 4 (four) times daily as needed. 360 tablet 3   No current facility-administered medications on file prior to visit.    Allergies  Allergen Reactions  . Sulfur Other (See Comments)    blisters  . Trulicity [Dulaglutide]     Abdominal pain  . Hctz [Hydrochlorothiazide] Rash    Possible photosensitivity dermatitis  . Invokana [Canagliflozin] Rash    Possible photosensitivity dermatitis   Family History  Problem Relation Age of Onset  . Hypertension Mother   . Thyroid disease Mother   . Diabetes Mother   . Thyroid disease Sister   . Diabetes Maternal Grandmother   . Hyperlipidemia Other   . Thyroid disease Other    PE: BP 128/82 (BP Location: Left Arm, Patient Position: Sitting)   Pulse 83   Ht 5\' 4"  (1.626 m)   Wt 183 lb (83 kg)   LMP 09/14/2017   SpO2 97%   BMI 31.41 kg/m  Wt Readings from Last 3 Encounters:  10/04/17 183 lb (83 kg)  08/01/17 183 lb (83 kg)   06/09/17 180 lb 12.8 oz (82 kg)   Constitutional: overweight, in NAD Eyes: PERRLA, EOMI, no exophthalmos ENT: moist mucous membranes, no thyromegaly, no cervical lymphadenopathy Cardiovascular: RRR, No MRG Respiratory: CTA B Gastrointestinal: abdomen soft, NT, ND, BS+ Musculoskeletal: no deformities, strength intact in all 4 Skin: moist, warm, no rashes Neurological: no tremor with outstretched hands, DTR normal in all 4  ASSESSMENT: 1. DM2, non-insulin-dependent, uncontrolled, without long term complications, but with hyperglycemia  2. Obesity class 1 BMI Classification:  < 18.5 underweight   18.5-24.9 normal weight   25.0-29.9 overweight   30.0-34.9 class I obesity   35.0-39.9 class II obesity   ? 40.0 class III obesity   PLAN:  1.  Patient with long-standing, uncontrolled diabetes, on oral antidiabetic regimen, which became insufficient. Last HbA1c was very high in 06/2016 >> 13.6%. Today: 11.9% (lower) - she is currently only on metformin but she does agree to retry Trulicity >> low dose and build up. We can try Victoza (more dose flexibility) if she cannot tolerate Trulicity as there may not be a class effect for her GI sxs. - we also discussed about the need to change her diet and I recommended a plant-based diet. She is interested in this, but acknowledges this will be hard for her as she loves meat. Given references. - I suggested to:  Patient Instructions  Please continue: - Metformin 1000 mg 2x a day  Please start Trulicity 0.75 mg weekly. Let me know when you are close to running out to call in the higher dose to your pharmacy (1.5 mg).  Please return in 1.5 months with your sugar log.   - Strongly advised her to start checking sugars at different times of the day - check 1-2 times a day, rotating checks - given sugar log and advised how to fill it and to bring it at next appt  - given foot care handout and explained the principles  - given instructions for  hypoglycemia management "15-15 rule"  - advised for yearly eye exams  - Return to clinic in 1.5 mo with sugar log   2. Obesity class 1 - discussed at length about dietary changes >> now on a fatty food diet conducive to weight gain, HL, worsening of DM. - Will also retry Trulicity >> should help with weight loss  Carlus Pavlov, MD PhD [facility] Endocrinology

## 2017-10-31 ENCOUNTER — Other Ambulatory Visit: Payer: Self-pay | Admitting: Family Medicine

## 2017-11-02 DIAGNOSIS — S338XXA Sprain of other parts of lumbar spine and pelvis, initial encounter: Secondary | ICD-10-CM | POA: Diagnosis not present

## 2017-11-02 DIAGNOSIS — M546 Pain in thoracic spine: Secondary | ICD-10-CM | POA: Diagnosis not present

## 2017-11-02 DIAGNOSIS — S134XXA Sprain of ligaments of cervical spine, initial encounter: Secondary | ICD-10-CM | POA: Diagnosis not present

## 2017-11-07 DIAGNOSIS — J0100 Acute maxillary sinusitis, unspecified: Secondary | ICD-10-CM | POA: Diagnosis not present

## 2017-11-16 ENCOUNTER — Ambulatory Visit: Payer: 59 | Admitting: Internal Medicine

## 2017-11-20 ENCOUNTER — Ambulatory Visit: Payer: 59 | Admitting: Cardiovascular Disease

## 2017-11-20 ENCOUNTER — Encounter: Payer: Self-pay | Admitting: Cardiovascular Disease

## 2017-11-20 VITALS — BP 136/92 | HR 90 | Ht 64.0 in | Wt 179.1 lb

## 2017-11-20 DIAGNOSIS — I471 Supraventricular tachycardia: Secondary | ICD-10-CM

## 2017-11-20 DIAGNOSIS — R002 Palpitations: Secondary | ICD-10-CM | POA: Diagnosis not present

## 2017-11-20 MED ORDER — METOPROLOL SUCCINATE ER 50 MG PO TB24: 50.0000 mg | ORAL_TABLET | Freq: Two times a day (BID) | ORAL | 3 refills | Status: DC

## 2017-11-20 NOTE — Progress Notes (Signed)
Julia [name] Date of Birth  07-Aug-1971       [facility]    [place] [facility] [address], [address]  [address], [address] [place], [place]  [address]   [place], [place]  [address] [phone]     [phone]   Fax  [phone]     Fax [phone]  Problem List: 1. Supraventricular tachycardia -status post ablation 2. Hypertension  3. Diabetes mellitus  History of Present Illness:  Julia [name] is seen today for follow up of SVT. See her in the past for evaluation of some chest discomfort. She normal stress echocardiogram in 2010. She had normal left ventricular systolic function at that time.  She is here to follow up for an episode of SVT.   It started suddenly while at work.  She had been drinking lots of diet mountain dew.  ( has since cut back). She had stopped her metformin but ha restarted.    She has had lots of palpitations  Nov. 17, 2015:  Julia [name] has continued to have palpitations .  Takes an extra metoprolol on occasion.    Seems to be related to her periods. Glucoses are better.   Still working on diet.    Dec. 9, 2016: Had an SVT ablation in March with Dr. Ladona Ridgelaylor. Metoprolol was stopped at that time .   Feb. 16, 2018:  Julia [name] is seen today after a 1 year visit  She has seen Vin in the meanwhile  Event monitor shows NSR with PVCs .    Is not exercising .    Has been traveling lots with work Julia [name]( Julia [name])   Dec. 18, 2018:  And is seen today for follow-up of her supraventricular tachycardia.  She is status post ablation. Has occasional palpitation.  Sound like PVCs Has had a cold.   Took some mucinex - no sudafed.  Has had profound fatigue  - unable to even move her arm s Wears a fit bit - frequenlty finds her HR to be 100   Current Outpatient Medications on File Prior to Visit  Medication Sig Dispense Refill  . cetirizine (ZYRTEC) 10 MG tablet Take 1 tablet (10 mg total) by mouth at bedtime. (Patient taking differently: Take 10 mg by mouth as  needed. ) 30 tablet 11  . Dulaglutide (TRULICITY) 0.75 MG/0.5ML SOPN Inject 0.75 mg weekly under skin 4 pen 1  . fluticasone (FLONASE) 50 MCG/ACT nasal spray Place 2 sprays into both nostrils daily.  6  . furosemide (LASIX) 40 MG tablet Take 1 tablet (40 mg total) by mouth daily. 30 tablet 3  . glucose blood (ONE TOUCH ULTRA TEST) test strip USE AS DIRECTED 200 each 3  . levothyroxine (SYNTHROID, LEVOTHROID) 75 MCG tablet Take 1 tablet (75 mcg total) by mouth daily. 90 tablet 0  . metFORMIN (GLUCOPHAGE) 1000 MG tablet Take 1 tablet (1,000 mg total) by mouth 2 (two) times daily with a meal. 180 tablet 3  . potassium chloride SA (K-DUR,KLOR-CON) 20 MEQ tablet Take 1 tablet (20 mEq total) by mouth daily. 30 tablet 3  . propranolol (INDERAL) 10 MG tablet Take 1 tablet (10 mg total) by mouth 4 (four) times daily as needed. 360 tablet 3   No current facility-administered medications on file prior to visit.     Allergies  Allergen Reactions  . Sulfa Antibiotics Hives  . Sulfur Other (See Comments)    blisters  . Trulicity [Dulaglutide]     Abdominal pain  .  Hctz [Hydrochlorothiazide] Rash    Possible photosensitivity dermatitis  . Invokana [Canagliflozin] Rash    Possible photosensitivity dermatitis    Past Medical History:  Diagnosis Date  . Complication of anesthesia    I had a reaction to a epidural "broke out"  . Condyloma acuminatum of vulva   . GERD (gastroesophageal reflux disease)   . History of supraventricular tachycardia   . HSV-1 infection   . HSV-2 infection   . HTN (hypertension)   . Hyperlipidemia   . Type 2 diabetes mellitus (HCC)     Past Surgical History:  Procedure Laterality Date  . CARDIOVASCULAR STRESS TEST  06-04-2007   normal nuclear study/  no ischemia/  normal LVF, ef 63%  . CESAREAN SECTION  09-06-2005  . LASER ABLATION CONDOLAMATA N/A 10/08/2015   Procedure: CO2 LASER ABLATION CONDOLAMATA OF VULVA;  Surgeon: Richarda Overlieichard Holland, MD;  [facility];  Service: Gynecology;  Laterality: N/A;  . SUPRAVENTRICULAR TACHYCARDIA ABLATION N/A 02/24/2015   Procedure: SUPRAVENTRICULAR TACHYCARDIA ABLATION;  Surgeon: Marinus MawGregg W Taylor, MD;  [facility];  Service: Cardiovascular;  Laterality: N/A;  . TRANSTHORACIC ECHOCARDIOGRAM  01-06-2009   normal LVF, ef 55-60%,  trace MR and TR/  normal stress echo    Social History   Tobacco Use  Smoking Status Former Smoker  . Years: 8.00  . Types: Cigarettes  . Last attempt to quit: 12/16/2002  . Years since quitting: 14.9  Smokeless Tobacco Never Used    Social History   Substance and Sexual Activity  Alcohol Use Yes  . Alcohol/week: 1.0 oz  . Types: 2 Standard drinks or equivalent per week   Comment: rare    Family History  Problem Relation Age of Onset  . Hypertension Mother   . Thyroid disease Mother   . Diabetes Mother   . Thyroid disease Sister   . Diabetes Maternal Grandmother   . Hyperlipidemia Other   . Thyroid disease Other     Reviw of Systems:  Reviewed in the HPI.  All other systems are negative.  Physical Exam: Blood pressure (!) 136/92, pulse 90, height 5\' 4"  (1.626 m), weight 179 lb 1.9 oz (81.2 kg), SpO2 98 %.  GEN:  Well nourished, well developed in no acute distress HEENT: Normal NECK: No JVD; No carotid bruits LYMPHATICS: No lymphadenopathy CARDIAC: RR, no murmurs, rubs, gallops RESPIRATORY:  Clear to auscultation without rales, wheezing or rhonchi  ABDOMEN: Soft, non-tender, non-distended MUSCULOSKELETAL:  No edema; No deformity  SKIN: Warm and dry NEUROLOGIC:  Alert and oriented x 3 Psych:  Normal   ECG:  Dec. 18, 2018:  NSR at 90    Assessment / Plan:   1. Supraventricular tachycardia- . No recurrent SVT.  Has had some palpitations .  We will check a basic metabolic profile and TSH tomorrow.  2. Hypertension -  Has occasional episodes of HTN Has been checking her BP  - diastolic BP has been elevated Eats out lots  Admits that  she eats lots of fast foods and salty foods.  She needs to go on a diet.  Her last triglyceride level is 640.  Glucose level was 366.  We will see her again in 3 months.  3. Diabetes mellitus - encouraged weight loss and exercise   4. Fatigue/shortness of breath:    Kristeen MissPhilip Ala Kratz, MD  11/20/2017 4:46 PM    [facility] [address],  [address] [place], [place]  [address] Pager  [place] Phone: [phone]; Fax: [phone]

## 2017-11-20 NOTE — Patient Instructions (Signed)
Medication Instructions:  Your physician has recommended you make the following change in your medication:  INCREASE Toprol (Metoprolol) 50 mg twice daily   Labwork: Your physician recommends that you return for lab work in: TOMORROW for basic metabolic panel, TSH   Testing/Procedures: None Ordered   Follow-Up: Your physician recommends that you schedule a follow-up appointment in: 3 months with Dr. Elease HashimotoNahser    If you need a refill on your cardiac medications before your next appointment, please call your pharmacy.   Thank you for choosing [facility]! Eligha BridegroomMichelle Tanasia Budzinski, RN [phone]

## 2017-11-21 ENCOUNTER — Other Ambulatory Visit: Payer: 59

## 2017-11-21 DIAGNOSIS — R002 Palpitations: Secondary | ICD-10-CM

## 2017-11-21 DIAGNOSIS — I471 Supraventricular tachycardia: Secondary | ICD-10-CM | POA: Diagnosis not present

## 2017-11-22 LAB — BASIC METABOLIC PANEL
BUN / CREAT RATIO: 16 (ref 9–23)
BUN: 9 mg/dL (ref 6–24)
CHLORIDE: 97 mmol/L (ref 96–106)
CO2: 26 mmol/L (ref 20–29)
Calcium: 9.1 mg/dL (ref 8.7–10.2)
Creatinine, Ser: 0.55 mg/dL — ABNORMAL LOW (ref 0.57–1.00)
GFR calc non Af Amer: 111 mL/min/{1.73_m2} (ref >59)
GFR, EST AFRICAN AMERICAN: 128 mL/min/{1.73_m2} (ref >59)
GLUCOSE: 271 mg/dL — AB (ref 65–99)
POTASSIUM: 4.3 mmol/L (ref 3.5–5.2)
SODIUM: 137 mmol/L (ref 134–144)

## 2017-11-22 LAB — TSH: TSH: 2.57 u[IU]/mL (ref 0.450–4.500)

## 2017-11-23 ENCOUNTER — Other Ambulatory Visit: Payer: Self-pay | Admitting: Family Medicine

## 2017-11-26 ENCOUNTER — Other Ambulatory Visit: Payer: Self-pay

## 2017-11-26 MED ORDER — DULAGLUTIDE 1.5 MG/0.5ML ~~LOC~~ SOAJ: SUBCUTANEOUS | 1 refills | Status: DC

## 2017-12-10 ENCOUNTER — Other Ambulatory Visit: Payer: Self-pay

## 2017-12-10 MED ORDER — DULAGLUTIDE 1.5 MG/0.5ML ~~LOC~~ SOAJ: SUBCUTANEOUS | 1 refills | Status: DC

## 2017-12-28 ENCOUNTER — Ambulatory Visit: Payer: 59 | Admitting: Internal Medicine

## 2017-12-28 DIAGNOSIS — Z0289 Encounter for other administrative examinations: Secondary | ICD-10-CM

## 2017-12-28 NOTE — Progress Notes (Deleted)
Patient ID: Julia [name], female   DOB: 1971/03/15, 47 y.o.   MRN: [id number]   HPI: Julia [name] is a 47 y.o.-year-old female, referred by her PCP, Dr. Clelia Croft, for management of DM2, dx in 2001, non-insulin-dependent, uncontrolled, without long-term complications.  Last hemoglobin A1c was: Lab Results  Component Value Date   HGBA1C 11.9 10/04/2017   HGBA1C 13.6 06/09/2017   HGBA1C 7.6 07/28/2016  HbA1c was lowest while on Weight Watchers.  Pt is on a regimen of: - Metformin 1000 mg 2x a day with meals - Trulicity 1.5 mg weekly - started 10/2017 She was on Trulicity, but stopped in 07/2017 because of abdominal pain She was on Invokana but felt that this gave her a rash (this was, however, diagnosed as photosensitivity per biopsy by dermatology) She was on Glipizide but developed blisters on feet, burning hands  Patient was not checking sugars at last visit, now 0 to 1x a day: - am: n/c - 2h after b'fast: n/c - before lunch: n/c - 2h after lunch: n/c - before dinner: n/c - 2h after dinner: n/c - bedtime: n/c - nighttime: n/c Lowest sugar was 160 >> ***; unclear if she has hypoglycemia awareness. Highest sugar was 300s >> ***.  Glucometer: One Touch  Pt's meals are: - Breakfast: bacon, egg, toast with mayo - Lunch: chicken or burger + french fries - Dinner: meat + veggies - Snacks: 2-3 - candy  -No CKD, last BUN/creatinine:  Lab Results  Component Value Date   BUN 9 11/21/2017   BUN 11 08/01/2017   CREATININE 0.55 (L) 11/21/2017   CREATININE 0.68 08/01/2017   No MAU: Lab Results  Component Value Date   MICRALBCREAT SEE NOTE 07/28/2016   - + HL; last set of lipids: Lab Results  Component Value Date   CHOL 197 06/09/2017   HDL 29 (L) 06/09/2017   LDLCALC Comment 06/09/2017   TRIG 640 (HH) 06/09/2017   CHOLHDL 6.8 (H) 06/09/2017  On Lipitor 20. - last eye exam was in 04/2016: No DR.  - Denies numbness and tingling in her feet.  ROS: Constitutional: no weight  gain/no weight loss, no fatigue, no subjective hyperthermia, no subjective hypothermia Eyes: no blurry vision, no xerophthalmia ENT: no sore throat, no nodules palpated in throat, no dysphagia, no odynophagia, no hoarseness Cardiovascular: no CP/no SOB/no palpitations/no leg swelling Respiratory: no cough/no SOB/no wheezing Gastrointestinal: no N/no V/no D/no C/no acid reflux Musculoskeletal: no muscle aches/no joint aches Skin: no rashes, no hair loss Neurological: no tremors/no numbness/no tingling/no dizziness  I reviewed pt's medications, allergies, PMH, social hx, family hx, and changes were documented in the history of present illness. Otherwise, unchanged from my initial visit note.  Past Medical History:  Diagnosis Date  . Complication of anesthesia    I had a reaction to a epidural "broke out"  . Condyloma acuminatum of vulva   . GERD (gastroesophageal reflux disease)   . History of supraventricular tachycardia   . HSV-1 infection   . HSV-2 infection   . HTN (hypertension)   . Hyperlipidemia   . Type 2 diabetes mellitus (HCC)    Past Surgical History:  Procedure Laterality Date  . CARDIOVASCULAR STRESS TEST  06-04-2007   normal nuclear study/  no ischemia/  normal LVF, ef 63%  . CESAREAN SECTION  09-06-2005  . LASER ABLATION CONDOLAMATA N/A 10/08/2015   Procedure: CO2 LASER ABLATION CONDOLAMATA OF VULVA;  Surgeon: Richarda Overlie, MD;  Location: [facility] [place];  Service:  Gynecology;  Laterality: N/A;  . SUPRAVENTRICULAR TACHYCARDIA ABLATION N/A 02/24/2015   Procedure: SUPRAVENTRICULAR TACHYCARDIA ABLATION;  Surgeon: Marinus MawGregg W Taylor, MD;  [facility];  Service: Cardiovascular;  Laterality: N/A;  . TRANSTHORACIC ECHOCARDIOGRAM  01-06-2009   normal LVF, ef 55-60%,  trace MR and TR/  normal stress echo   Social History   Social History  . Marital status: Married    Spouse name: N/A  . Number of children: 1   Occupational History  . Manager, [employer]  operations   Social History Main Topics  . Smoking status: Former Smoker    Years: 8.00    Types: Cigarettes    Quit date: 12/16/2002  . Smokeless tobacco: Never Used  . Alcohol use 1.0 oz/week    1 Standard drinks or equivalent per week     Comment: rare  . Drug use: No  . Sexual activity: Yes    Birth control/ protection: None   Current Outpatient Medications on File Prior to Visit  Medication Sig Dispense Refill  . cetirizine (ZYRTEC) 10 MG tablet Take 1 tablet (10 mg total) by mouth at bedtime. (Patient taking differently: Take 10 mg by mouth as needed. ) 30 tablet 11  . Dulaglutide (TRULICITY) 1.5 MG/0.5ML SOPN Inject into 1.5mg  skin weekly 6 pen 1  . fluticasone (FLONASE) 50 MCG/ACT nasal spray Place 2 sprays into both nostrils daily.  6  . furosemide (LASIX) 40 MG tablet Take 1 tablet (40 mg total) by mouth daily. 30 tablet 3  . glucose blood (ONE TOUCH ULTRA TEST) test strip USE AS DIRECTED 200 each 3  . levothyroxine (SYNTHROID, LEVOTHROID) 75 MCG tablet TAKE 1 TABLET BY MOUTH EVERY DAY 90 tablet 0  . metFORMIN (GLUCOPHAGE) 1000 MG tablet Take 1 tablet (1,000 mg total) by mouth 2 (two) times daily with a meal. 180 tablet 3  . metoprolol succinate (TOPROL-XL) 50 MG 24 hr tablet Take 1 tablet (50 mg total) by mouth 2 (two) times daily. Take with or immediately following a meal. 180 tablet 3  . potassium chloride SA (K-DUR,KLOR-CON) 20 MEQ tablet Take 1 tablet (20 mEq total) by mouth daily. 30 tablet 3  . propranolol (INDERAL) 10 MG tablet Take 1 tablet (10 mg total) by mouth 4 (four) times daily as needed. 360 tablet 3   No current facility-administered medications on file prior to visit.    Allergies  Allergen Reactions  . Sulfa Antibiotics Hives  . Sulfur Other (See Comments)    blisters  . Trulicity [Dulaglutide]     Abdominal pain  . Hctz [Hydrochlorothiazide] Rash    Possible photosensitivity dermatitis  . Invokana [Canagliflozin] Rash    Possible photosensitivity  dermatitis   Family History  Problem Relation Age of Onset  . Hypertension Mother   . Thyroid disease Mother   . Diabetes Mother   . Thyroid disease Sister   . Diabetes Maternal Grandmother   . Hyperlipidemia Other   . Thyroid disease Other    Pt has FH of DM in mother, 2 sisters, M grandmother.   PE: There were no vitals taken for this visit. Wt Readings from Last 3 Encounters:  11/20/17 179 lb 1.9 oz (81.2 kg)  10/04/17 183 lb (83 kg)  08/01/17 183 lb (83 kg)   Constitutional: overweight, in NAD Eyes: PERRLA, EOMI, no exophthalmos ENT: moist mucous membranes, no thyromegaly, no cervical lymphadenopathy Cardiovascular: RRR, No MRG Respiratory: CTA B Gastrointestinal: abdomen soft, NT, ND, BS+ Musculoskeletal: no deformities, strength intact in  all 4 Skin: moist, warm, no rashes Neurological: no tremor with outstretched hands, DTR normal in all 4  ASSESSMENT: 1. DM2, non-insulin-dependent, uncontrolled, without long term complications, but with hyperglycemia  2. Obesity class 1 BMI Classification:  < 18.5 underweight   18.5-24.9 normal weight   25.0-29.9 overweight   30.0-34.9 class I obesity   35.0-39.9 class II obesity   ? 40.0 class III obesity   3. HL  PLAN:  1. Patient with long-standing, uncontrolled, diabetes type 2, on oral antidiabetic regimen and now back on Trulicity since 10/2017.  She initially had abdominal pain while taking Trulicity, but at last visit she decided to retry this since she was not sure that the abdominal pain was due to Trulicity. At last visit, we also discussed at length about the need to change her diet as she was eating mostly fast foods and I recommended a plant-based diet, but she did not do this.  - I suggested to:  Patient Instructions  Please continue: - Metformin 1000 mg 2x a day  - Trulicity 1.5 mg weekly  Please return in 3 months with your sugar log.   - today, HbA1c is 7%  - continue checking sugars at  different times of the day - check 1x a day, rotating checks - advised for yearly eye exams >> she is UTD - Return to clinic in 3 mo with sugar log   2. Obesity class 1 - We discussed at length about dietary changes at last visit, but she continues to eat a lot of fast food. - We added Trulicity back at last visit, we should also help with weight loss  3. HL - Reviewed lipid panel from 06/2017: Triglycerides very high, almost 700s. - a change in diet to decrease fatty foods will greatly help - Continues on Lipitor  Without side effects  Carlus Pavlov, MD PhD [facility] Endocrinology

## 2018-01-17 DIAGNOSIS — L218 Other seborrheic dermatitis: Secondary | ICD-10-CM | POA: Diagnosis not present

## 2018-01-17 DIAGNOSIS — L309 Dermatitis, unspecified: Secondary | ICD-10-CM | POA: Diagnosis not present

## 2018-02-11 DIAGNOSIS — L239 Allergic contact dermatitis, unspecified cause: Secondary | ICD-10-CM | POA: Diagnosis not present

## 2018-02-15 ENCOUNTER — Ambulatory Visit: Payer: 59 | Admitting: Cardiovascular Disease

## 2018-02-15 DIAGNOSIS — L249 Irritant contact dermatitis, unspecified cause: Secondary | ICD-10-CM | POA: Diagnosis not present

## 2018-03-07 ENCOUNTER — Ambulatory Visit: Payer: 59 | Admitting: Physician Assistant

## 2018-03-15 ENCOUNTER — Ambulatory Visit: Payer: 59 | Admitting: Family Medicine

## 2018-03-15 ENCOUNTER — Encounter: Payer: Self-pay | Admitting: Family Medicine

## 2018-03-15 ENCOUNTER — Other Ambulatory Visit: Payer: Self-pay

## 2018-03-15 VITALS — BP 118/80 | HR 94 | Temp 97.9°F | Resp 18 | Ht 64.0 in | Wt 178.2 lb

## 2018-03-15 DIAGNOSIS — E039 Hypothyroidism, unspecified: Secondary | ICD-10-CM

## 2018-03-15 DIAGNOSIS — E782 Mixed hyperlipidemia: Secondary | ICD-10-CM | POA: Diagnosis not present

## 2018-03-15 DIAGNOSIS — R0981 Nasal congestion: Secondary | ICD-10-CM | POA: Insufficient documentation

## 2018-03-15 DIAGNOSIS — E1165 Type 2 diabetes mellitus with hyperglycemia: Secondary | ICD-10-CM | POA: Diagnosis not present

## 2018-03-15 DIAGNOSIS — I158 Other secondary hypertension: Secondary | ICD-10-CM

## 2018-03-15 DIAGNOSIS — Z5181 Encounter for therapeutic drug level monitoring: Secondary | ICD-10-CM

## 2018-03-15 DIAGNOSIS — E669 Obesity, unspecified: Secondary | ICD-10-CM | POA: Diagnosis not present

## 2018-03-15 DIAGNOSIS — I471 Supraventricular tachycardia: Secondary | ICD-10-CM

## 2018-03-15 DIAGNOSIS — R002 Palpitations: Secondary | ICD-10-CM

## 2018-03-15 DIAGNOSIS — E038 Other specified hypothyroidism: Secondary | ICD-10-CM

## 2018-03-15 LAB — GLUCOSE, POCT (MANUAL RESULT ENTRY): POC GLUCOSE: 134 mg/dL — AB (ref 70–99)

## 2018-03-15 LAB — POCT GLYCOSYLATED HEMOGLOBIN (HGB A1C): Hemoglobin A1C: 10.7

## 2018-03-15 MED ORDER — PROPRANOLOL HCL 10 MG PO TABS: 10.0000 mg | ORAL_TABLET | Freq: Four times a day (QID) | ORAL | 3 refills | Status: DC | PRN

## 2018-03-15 MED ORDER — FUROSEMIDE 40 MG PO TABS: 40.0000 mg | ORAL_TABLET | Freq: Every day | ORAL | 1 refills | Status: DC

## 2018-03-15 MED ORDER — DULAGLUTIDE 0.75 MG/0.5ML ~~LOC~~ SOAJ: 0.7500 mg | SUBCUTANEOUS | 1 refills | Status: DC

## 2018-03-15 MED ORDER — LEVOTHYROXINE SODIUM 75 MCG PO TABS: 75.0000 ug | ORAL_TABLET | Freq: Every day | ORAL | 0 refills | Status: DC

## 2018-03-15 MED ORDER — POTASSIUM CHLORIDE CRYS ER 20 MEQ PO TBCR: 20.0000 meq | EXTENDED_RELEASE_TABLET | Freq: Every day | ORAL | 1 refills | Status: DC

## 2018-03-15 NOTE — Patient Instructions (Signed)
     IF you received an x-ray today, you will receive an invoice from [facility]. Please contact [facility] at [phone] with questions or concerns regarding your invoice.   IF you received labwork today, you will receive an invoice from [facility]. Please contact [facility] at [phone] with questions or concerns regarding your invoice.   Our billing staff will not be able to assist you with questions regarding bills from these companies.  You will be contacted with the lab results as soon as they are available. The fastest way to get your results is to activate your My Chart account. Instructions are located on the last page of this paperwork. If you have not heard from us regarding the results in 2 weeks, please contact this office.     

## 2018-03-15 NOTE — Progress Notes (Signed)
Subjective:  By signing my name below, I, Julia [name], attest that this documentation has been prepared under the direction and in the presence of Julia SorensonEva Jaeleigh Monaco, MD Electronically Signed: Charline BillsEssence [name], ED Scribe 03/15/2018 at 12:37 PM.   Patient ID: Julia [name], female    DOB: 1971/05/21, 47 y.o.   MRN: [id number]  Chief Complaint  Patient presents with  . Diabetes    Pt states she doesn't check sugars regularly. Pt states she checks sugar x2 a week. Pt states sugars have been running between 200-300.  . Follow-up   HPI  Julia [name] is a 47 y.o. female who presents to [facility] at [facility] for f/u on DM. Pt checks her blood glucose twice/wk and has noticed that her blood glucose ranges between 200-300. Pt is not fasting at this visit.  DM Pt recently started a program through [facility] [place], that provides coaching via e-mails and a new meter with testing strips. She tried Trulicity which gave her abdominal pain, restarted it at 0.75 without any side-effects but she has been out of Trulicity for a while. Pt has still been taking Metformin with an average blood glucose in the 180s while on both. Reports a morning fasting lab of 189 today. She has tried to eliminate red meat from her diet and bacon other than in the mornings, she has switched mainly to [place]key bacon. Has also cut back to 3 sodas/wk and unsweetened tea. Denies nausea, vomiting, diarrhea.  HTN She has been taking Lasix daily. Denies leg swelling at this time but does have some at the end of the day if she sits all day, resolves by the morning.  Thyroid Disease Denies heat/cold intolerance, changes in weight, hair, skin, nails, bowels. She has missed 1-2 doses, especially on the weekends, and noticed palpitations. States she takes her synthroid more than she misses it.  GERD Still has flare-ups. Takes Protonix prn. Denies vomiting, nausea.  ENT Referral Pt reports chronic nasal congestion, sinus pressure and HAs x 2-3 yrs.  Reports symptoms seem to occur year long. States she has to press her nostril and move it to get it to drain. She has tried Flonase and saline spray daily.  Past Medical History:  Diagnosis Date  . Complication of anesthesia    I had a reaction to a epidural "broke out"  . Condyloma acuminatum of vulva   . GERD (gastroesophageal reflux disease)   . History of supraventricular tachycardia   . HSV-1 infection   . HSV-2 infection   . HTN (hypertension)   . Hyperlipidemia   . Type 2 diabetes mellitus (HCC)    Current Outpatient Medications on File Prior to Visit  Medication Sig Dispense Refill  . Dulaglutide (TRULICITY) 1.5 MG/0.5ML SOPN Inject into 1.5mg  skin weekly 6 pen 1  . fluticasone (FLONASE) 50 MCG/ACT nasal spray Place 2 sprays into both nostrils daily.  6  . furosemide (LASIX) 40 MG tablet Take 1 tablet (40 mg total) by mouth daily. 30 tablet 3  . glucose blood (ONE TOUCH ULTRA TEST) test strip USE AS DIRECTED 200 each 3  . levothyroxine (SYNTHROID, LEVOTHROID) 75 MCG tablet TAKE 1 TABLET BY MOUTH EVERY DAY 90 tablet 0  . metFORMIN (GLUCOPHAGE) 1000 MG tablet Take 1 tablet (1,000 mg total) by mouth 2 (two) times daily with a meal. 180 tablet 3  . metoprolol succinate (TOPROL-XL) 50 MG 24 hr tablet Take 1 tablet (50 mg total) by mouth 2 (two) times daily. Take with or  immediately following a meal. 180 tablet 3  . potassium chloride SA (K-DUR,KLOR-CON) 20 MEQ tablet Take 1 tablet (20 mEq total) by mouth daily. 30 tablet 3  . propranolol (INDERAL) 10 MG tablet Take 1 tablet (10 mg total) by mouth 4 (four) times daily as needed. 360 tablet 3  . cetirizine (ZYRTEC) 10 MG tablet Take 1 tablet (10 mg total) by mouth at bedtime. (Patient not taking: Reported on 03/15/2018) 30 tablet 11   No current facility-administered medications on file prior to visit.    Past Surgical History:  Procedure Laterality Date  . CARDIOVASCULAR STRESS TEST  06-04-2007   normal nuclear study/  no ischemia/   normal LVF, ef 63%  . CESAREAN SECTION  09-06-2005  . LASER ABLATION CONDOLAMATA N/A 10/08/2015   Procedure: CO2 LASER ABLATION CONDOLAMATA OF VULVA;  Surgeon: Richarda Overlie, MD;  Location[facility];  Service: Gynecology;  Laterality: N/A;  . SUPRAVENTRICULAR TACHYCARDIA ABLATION N/A 02/24/2015   Procedure: SUPRAVENTRICULAR TACHYCARDIA ABLATION;  Surgeon: Marinus Maw, MD;  Location: [facility] CATH LAB;  Service: Cardiovascular;  Laterality: N/A;  . TRANSTHORACIC ECHOCARDIOGRAM  01-06-2009   normal LVF, ef 55-60%,  trace MR and TR/  normal stress echo   Allergies  Allergen Reactions  . Sulfa Antibiotics Hives  . Sulfur Other (See Comments)    blisters  . Trulicity [Dulaglutide]     Abdominal pain  . Hctz [Hydrochlorothiazide] Rash    Possible photosensitivity dermatitis  . Invokana [Canagliflozin] Rash    Possible photosensitivity dermatitis   Family History  Problem Relation Age of Onset  . Hypertension Mother   . Thyroid disease Mother   . Diabetes Mother   . Thyroid disease Sister   . Diabetes Maternal Grandmother   . Hyperlipidemia Other   . Thyroid disease Other    Social History   Socioeconomic History  . Marital status: Married    Spouse name: Not on file  . Number of children: Not on file  . Years of education: Not on file  . Highest education level: Not on file  Occupational History  . Not on file  Social Needs  . Financial resource strain: Not on file  . Food insecurity:    Worry: Not on file    Inability: Not on file  . Transportation needs:    Medical: Not on file    Non-medical: Not on file  Tobacco Use  . Smoking status: Former Smoker    Years: 8.00    Types: Cigarettes    Last attempt to quit: 12/16/2002    Years since quitting: 15.7  . Smokeless tobacco: Never Used  Substance and Sexual Activity  . Alcohol use: Yes    Alcohol/week: 2.0 standard drinks    Types: 2 Standard drinks or equivalent per week    Comment: rare  . Drug  use: No  . Sexual activity: Yes    Birth control/protection: None  Lifestyle  . Physical activity:    Days per week: Not on file    Minutes per session: Not on file  . Stress: Not on file  Relationships  . Social connections:    Talks on phone: Not on file    Gets together: Not on file    Attends religious service: Not on file    Active member of club or organization: Not on file    Attends meetings of clubs or organizations: Not on file    Relationship status: Not on file  Other Topics Concern  .  Not on file  Social History Narrative  . Not on file   Depression screen [facility] 2/9 03/15/2018 08/01/2017 06/09/2017 05/25/2017 08/18/2016  Decreased Interest 0 0 1 0 0  Down, Depressed, Hopeless 0 0 1 0 0  PHQ - 2 Score 0 0 2 0 0  Altered sleeping - - 0 - -  Tired, decreased energy - - 1 - -  Change in appetite - - 3 - -  Feeling bad or failure about yourself  - - 1 - -  Trouble concentrating - - 0 - -  Moving slowly or fidgety/restless - - 0 - -  Suicidal thoughts - - 0 - -  PHQ-9 Score - - 7 - -     Review of Systems  Constitutional: Negative for unexpected weight change.  HENT: Positive for congestion and sinus pressure.   Cardiovascular: Negative for leg swelling.  Gastrointestinal: Negative for constipation, diarrhea, nausea and vomiting.  Endocrine: Negative for cold intolerance and heat intolerance.  Skin: Negative.  Negative for color change.  Neurological: Positive for headaches.      Objective:   Physical Exam  Constitutional: She is oriented to person, place, and time. She appears well-developed and well-nourished. No distress.  HENT:  Head: Normocephalic and atraumatic.  Eyes: Conjunctivae and EOM are normal.  Neck: Neck supple. No tracheal deviation present.  Thyroid feels mildly enlarged.  Cardiovascular: Normal rate, regular rhythm and normal heart sounds.  Pulmonary/Chest: Effort normal and breath sounds normal. No respiratory distress.  Musculoskeletal:  Normal range of motion.  Neurological: She is alert and oriented to person, place, and time.  Skin: Skin is warm and dry.  Psychiatric: She has a normal mood and affect. Her behavior is normal.  Nursing note and vitals reviewed.  BP 118/80 (BP Location: Left Arm, Patient Position: Sitting, Cuff Size: Large)   Pulse 94   Temp 97.9 F (36.6 C) (Oral)   Resp 18   Ht 5\' 4"  (1.626 m)   Wt 178 lb 3.2 oz (80.8 kg)   LMP 03/11/2018   SpO2 98%   BMI 30.59 kg/m     Results for orders placed or performed in visit on 03/15/18  POCT glycosylated hemoglobin (Hb A1C)  Result Value Ref Range   Hemoglobin A1C 10.7   POCT glucose (manual entry)  Result Value Ref Range   POC Glucose 134 (A) 70 - 99 mg/dl   Assessment & Plan:   1. Type 2 diabetes mellitus with hyperglycemia, without long-term current use of insulin (HCC)   2. Mixed hyperlipidemia   3. Subclinical hypothyroidism   4. Obesity, Class I, BMI 30-34.9   5. Other secondary hypertension   6. Medication monitoring encounter   7. Palpitations   8. SVT (supraventricular tachycardia) (HCC)   9. Nasal sinus congestion     Orders Placed This Encounter  Procedures  . Comprehensive metabolic panel    Order Specific Question:   Has the patient fasted?    Answer:   Yes  . TSH+T4F+T3Free  . Vitamin B12  . Lipid panel    Standing Status:   Future    Standing Expiration Date:   03/16/2019    Order Specific Question:   Has the patient fasted?    Answer:   Yes  . Ambulatory referral to ENT    Referral Priority:   Routine    Referral Type:   Consultation    Referral Reason:   Specialty Services Required    Referred to Provider:  Newman Pies, MD    Requested Specialty:   Otolaryngology    Number of Visits Requested:   1  . POCT glycosylated hemoglobin (Hb A1C)  . POCT glucose (manual entry)    Meds ordered this encounter  Medications  . Dulaglutide (TRULICITY) 0.75 MG/0.5ML SOPN    Sig: Inject 0.75 mg into the skin once a week.     Dispense:  12 pen    Refill:  1  . propranolol (INDERAL) 10 MG tablet    Sig: Take 1 tablet (10 mg total) by mouth 4 (four) times daily as needed.    Dispense:  360 tablet    Refill:  3  . potassium chloride SA (K-DUR,KLOR-CON) 20 MEQ tablet    Sig: Take 1 tablet (20 mEq total) by mouth daily.    Dispense:  90 tablet    Refill:  1  . furosemide (LASIX) 40 MG tablet    Sig: Take 1 tablet (40 mg total) by mouth daily.    Dispense:  90 tablet    Refill:  1  . levothyroxine (SYNTHROID, LEVOTHROID) 75 MCG tablet    Sig: Take 1 tablet (75 mcg total) by mouth daily.    Dispense:  90 tablet    Refill:  0   I personally performed the services described in this documentation, which was scribed in my presence. The recorded information has been reviewed and considered, and addended by me as needed.   Julia Sorenson, M.D.  Primary Care at [facility] [address] [place], [place] [address] [phone] phone [phone] fax  08/30/18 5:56 AM

## 2018-03-16 LAB — COMPREHENSIVE METABOLIC PANEL
ALT: 22 IU/L (ref 0–32)
AST: 32 IU/L (ref 0–40)
Albumin/Globulin Ratio: 1.5 (ref 1.2–2.2)
Albumin: 4.3 g/dL (ref 3.5–5.5)
Alkaline Phosphatase: 123 IU/L — ABNORMAL HIGH (ref 39–117)
BUN/Creatinine Ratio: 15 (ref 9–23)
BUN: 9 mg/dL (ref 6–24)
Bilirubin Total: 0.3 mg/dL (ref 0.0–1.2)
CALCIUM: 9 mg/dL (ref 8.7–10.2)
CO2: 25 mmol/L (ref 20–29)
CREATININE: 0.61 mg/dL (ref 0.57–1.00)
Chloride: 95 mmol/L — ABNORMAL LOW (ref 96–106)
GFR, EST AFRICAN AMERICAN: 124 mL/min/{1.73_m2} (ref >59)
GFR, EST NON AFRICAN AMERICAN: 108 mL/min/{1.73_m2} (ref >59)
GLOBULIN, TOTAL: 2.8 g/dL (ref 1.5–4.5)
Glucose: 151 mg/dL — ABNORMAL HIGH (ref 65–99)
Potassium: 3.8 mmol/L (ref 3.5–5.2)
Sodium: 138 mmol/L (ref 134–144)
TOTAL PROTEIN: 7.1 g/dL (ref 6.0–8.5)

## 2018-03-16 LAB — TSH+T4F+T3FREE
FREE T4: 1.42 ng/dL (ref 0.82–1.77)
T3 FREE: 2.5 pg/mL (ref 2.0–4.4)
TSH: 3.46 u[IU]/mL (ref 0.450–4.500)

## 2018-03-16 LAB — VITAMIN B12: Vitamin B-12: 214 pg/mL — ABNORMAL LOW (ref 232–1245)

## 2018-04-09 ENCOUNTER — Ambulatory Visit: Payer: 59 | Admitting: Physician Assistant

## 2018-04-25 ENCOUNTER — Encounter: Payer: Self-pay | Admitting: Family Medicine

## 2018-05-13 ENCOUNTER — Ambulatory Visit: Payer: 59 | Admitting: Physician Assistant

## 2018-06-03 ENCOUNTER — Ambulatory Visit: Payer: 59 | Admitting: Family Medicine

## 2018-06-11 ENCOUNTER — Other Ambulatory Visit: Payer: Self-pay | Admitting: Family Medicine

## 2018-08-07 DIAGNOSIS — R51 Headache: Secondary | ICD-10-CM | POA: Diagnosis not present

## 2018-08-07 DIAGNOSIS — J343 Hypertrophy of nasal turbinates: Secondary | ICD-10-CM | POA: Diagnosis not present

## 2018-08-07 DIAGNOSIS — J310 Chronic rhinitis: Secondary | ICD-10-CM | POA: Diagnosis not present

## 2018-08-21 ENCOUNTER — Other Ambulatory Visit (INDEPENDENT_AMBULATORY_CARE_PROVIDER_SITE_OTHER): Payer: Self-pay | Admitting: Otolaryngology

## 2018-08-21 DIAGNOSIS — J329 Chronic sinusitis, unspecified: Secondary | ICD-10-CM

## 2018-09-08 ENCOUNTER — Other Ambulatory Visit: Payer: Self-pay | Admitting: Family Medicine

## 2018-09-09 NOTE — Telephone Encounter (Signed)
Attempted to contact pt regarding refill request; pharmacy note dated 06/11/18 states pt needs office visit for more refills; left message on voicemail [phone]; when pt calls back please schedule appointment.    Requested Prescriptions  Pending Prescriptions Disp Refills  . levothyroxine (SYNTHROID, LEVOTHROID) 75 MCG tablet [Pharmacy Med Name: LEVOTHYROXINE 75 MCG TABLET] 30 tablet 0    Sig: TAKE 1 TABLET BY MOUTH EVERY DAY     Endocrinology:  Hypothyroid Agents Failed - 09/08/2018  9:02 AM      Failed - TSH needs to be rechecked within 3 months after an abnormal result. Refill until TSH is due.      Passed - TSH in normal range and within 360 days    TSH  Date Value Ref Range Status  03/15/2018 3.460 0.450 - 4.500 uIU/mL Final         Passed - Valid encounter within last 12 months    Recent Outpatient Visits          5 months ago Type 2 diabetes mellitus with hyperglycemia, without long-term current use of insulin [facility])   [facility], Levell July, MD   1 year ago Pedal edema   [facility], Levell July, MD   1 year ago Uncontrolled type 2 diabetes mellitus without complication, without long-term current use of insulin [facility])   [facility], Levell July, MD   1 year ago Abscess or cellulitis of toe, right   [facility], Dema Severin, PA-C   2 years ago Pain of finger of left hand   Primary Care at [facility], Madelaine Bhat, [place]

## 2018-09-13 ENCOUNTER — Other Ambulatory Visit: Payer: 59

## 2018-09-26 DIAGNOSIS — Z1231 Encounter for screening mammogram for malignant neoplasm of breast: Secondary | ICD-10-CM | POA: Diagnosis not present

## 2018-10-06 ENCOUNTER — Other Ambulatory Visit: Payer: Self-pay | Admitting: Family Medicine

## 2018-10-07 NOTE — Telephone Encounter (Signed)
Called patient to schedule an appointment for her refill for levothyroxine. No answer and mail box was full.

## 2018-10-09 ENCOUNTER — Other Ambulatory Visit: Payer: Self-pay | Admitting: Family Medicine

## 2018-10-09 NOTE — Telephone Encounter (Signed)
Pt. Needs an OV. Unable to leave message.

## 2018-10-09 NOTE — Telephone Encounter (Signed)
Mailbox is full, unable to leave message. Pt. Needs to schedule an appointment.

## 2018-10-24 DIAGNOSIS — S233XXA Sprain of ligaments of thoracic spine, initial encounter: Secondary | ICD-10-CM | POA: Diagnosis not present

## 2018-10-24 DIAGNOSIS — S134XXA Sprain of ligaments of cervical spine, initial encounter: Secondary | ICD-10-CM | POA: Diagnosis not present

## 2018-10-24 DIAGNOSIS — S336XXA Sprain of sacroiliac joint, initial encounter: Secondary | ICD-10-CM | POA: Diagnosis not present

## 2018-10-28 DIAGNOSIS — S233XXA Sprain of ligaments of thoracic spine, initial encounter: Secondary | ICD-10-CM | POA: Diagnosis not present

## 2018-10-28 DIAGNOSIS — S134XXA Sprain of ligaments of cervical spine, initial encounter: Secondary | ICD-10-CM | POA: Diagnosis not present

## 2018-10-28 DIAGNOSIS — S336XXA Sprain of sacroiliac joint, initial encounter: Secondary | ICD-10-CM | POA: Diagnosis not present

## 2018-10-29 DIAGNOSIS — L309 Dermatitis, unspecified: Secondary | ICD-10-CM | POA: Diagnosis not present

## 2018-10-31 ENCOUNTER — Other Ambulatory Visit: Payer: Self-pay | Admitting: Family Medicine

## 2018-11-01 NOTE — Telephone Encounter (Signed)
Requested Prescriptions  Pending Prescriptions Disp Refills  . levothyroxine (SYNTHROID, LEVOTHROID) 75 MCG tablet [Pharmacy Med Name: LEVOTHYROXINE 75 MCG TABLET] 90 tablet 1    Sig: TAKE 1 TABLET BY MOUTH EVERY DAY     Endocrinology:  Hypothyroid Agents Failed - 10/31/2018  2:11 AM      Failed - TSH needs to be rechecked within 3 months after an abnormal result. Refill until TSH is due.      Passed - TSH in normal range and within 360 days    TSH  Date Value Ref Range Status  03/15/2018 3.460 0.450 - 4.500 uIU/mL Final         Passed - Valid encounter within last 12 months    Recent Outpatient Visits          7 months ago Type 2 diabetes mellitus with hyperglycemia, without long-term current use of insulin [facility](HCC)   [facility], Levell JulyEva N, MD   1 year ago Pedal edema   [facility], Levell JulyEva N, MD   1 year ago Uncontrolled type 2 diabetes mellitus without complication, without long-term current use of insulin [facility](HCC)   [facility], Levell JulyEva N, MD   1 year ago Abscess or cellulitis of toe, right   [facility], Dema SeverinSarah L, PA-C   2 years ago Pain of finger of left hand   [facility], [facility], [place]

## 2018-11-02 ENCOUNTER — Other Ambulatory Visit: Payer: Self-pay | Admitting: Cardiovascular Disease

## 2018-11-14 DIAGNOSIS — Z6830 Body mass index (BMI) 30.0-30.9, adult: Secondary | ICD-10-CM | POA: Diagnosis not present

## 2018-11-14 DIAGNOSIS — S336XXA Sprain of sacroiliac joint, initial encounter: Secondary | ICD-10-CM | POA: Diagnosis not present

## 2018-11-14 DIAGNOSIS — S134XXA Sprain of ligaments of cervical spine, initial encounter: Secondary | ICD-10-CM | POA: Diagnosis not present

## 2018-11-14 DIAGNOSIS — S233XXA Sprain of ligaments of thoracic spine, initial encounter: Secondary | ICD-10-CM | POA: Diagnosis not present

## 2018-11-14 DIAGNOSIS — Z01419 Encounter for gynecological examination (general) (routine) without abnormal findings: Secondary | ICD-10-CM | POA: Diagnosis not present

## 2018-11-29 DIAGNOSIS — S233XXA Sprain of ligaments of thoracic spine, initial encounter: Secondary | ICD-10-CM | POA: Diagnosis not present

## 2018-11-29 DIAGNOSIS — S336XXA Sprain of sacroiliac joint, initial encounter: Secondary | ICD-10-CM | POA: Diagnosis not present

## 2018-11-29 DIAGNOSIS — S134XXA Sprain of ligaments of cervical spine, initial encounter: Secondary | ICD-10-CM | POA: Diagnosis not present

## 2018-11-30 DIAGNOSIS — H40033 Anatomical narrow angle, bilateral: Secondary | ICD-10-CM | POA: Diagnosis not present

## 2018-11-30 DIAGNOSIS — E119 Type 2 diabetes mellitus without complications: Secondary | ICD-10-CM | POA: Diagnosis not present

## 2019-01-21 ENCOUNTER — Ambulatory Visit: Payer: 59 | Admitting: Family Medicine

## 2019-02-06 ENCOUNTER — Other Ambulatory Visit: Payer: Self-pay | Admitting: Cardiovascular Disease

## 2019-02-18 ENCOUNTER — Other Ambulatory Visit: Payer: Self-pay | Admitting: Internal Medicine

## 2019-02-18 ENCOUNTER — Other Ambulatory Visit: Payer: Self-pay | Admitting: Family Medicine

## 2019-02-18 DIAGNOSIS — E1165 Type 2 diabetes mellitus with hyperglycemia: Principal | ICD-10-CM

## 2019-02-18 DIAGNOSIS — IMO0001 Reserved for inherently not codable concepts without codable children: Secondary | ICD-10-CM

## 2019-02-18 NOTE — Telephone Encounter (Signed)
Per PCP. She was lost for f/u with me.

## 2019-02-18 NOTE — Telephone Encounter (Signed)
Last OV 10/04/17 refill or refuse please advise

## 2019-02-19 ENCOUNTER — Telehealth: Payer: Self-pay | Admitting: Family Medicine

## 2019-02-19 NOTE — Telephone Encounter (Signed)
Pt needs an apt to get meds due to no providers fillings meds here and has been out since 2019 and also needs to est care.

## 2019-02-19 NOTE — Telephone Encounter (Signed)
Copied from CRM [id number]. Topic: Quick Communication - Rx Refill/Question >> Feb 19, 2019  8:20 AM Retta Diones wrote: Medication: METFORMIN (GLUCOPHAGE) 1000 MG tablet  Has the patient contacted their pharmacy? Yes.   (Agent: If no, request that the patient contact the pharmacy for the refill.) (Agent: If yes, when and what did the pharmacy advise?)  Preferred Pharmacy (with phone number or street name): [id number] [place] [place] - [address] [phone] (Phone) [phone] (Fax)    Agent: Please be advised that RX refills may take up to 3 business days. We ask that you follow-up with your pharmacy.   COMPLETELY OUT OF MEDICATION

## 2019-02-20 ENCOUNTER — Other Ambulatory Visit: Payer: Self-pay | Admitting: Family Medicine

## 2019-02-20 DIAGNOSIS — IMO0001 Reserved for inherently not codable concepts without codable children: Secondary | ICD-10-CM

## 2019-02-20 DIAGNOSIS — E1165 Type 2 diabetes mellitus with hyperglycemia: Principal | ICD-10-CM

## 2019-02-20 MED ORDER — METFORMIN HCL 1000 MG PO TABS: 1000.0000 mg | ORAL_TABLET | Freq: Two times a day (BID) | ORAL | 0 refills | Status: DC

## 2019-02-20 NOTE — Telephone Encounter (Signed)
Pt calling to request refill of Metformin per [facility].  Reviewed chart.  Pt was being followed by [facility] who rx Metformin.  Has been out since 2019 per Rx.  Advised [facility] to have pt f/u with endo for refill or would need to establish care with MD here as she has not been seen or had labwork done in nearly one year.  [facility] c/b stating pt would like same day appt as no [facility] appt available until May.  Advised unable to use Same Day to est care/med refill but will see if a provider is agreeable to refill x 2 mos. [facility] to confirm c/b number and pharmacy.

## 2019-02-20 NOTE — Telephone Encounter (Signed)
Please notify pt that a refill for metformin was sent to her pharmacy Encourage her to keep the appointment in May for evaluation of her diabetes Bring a copy of her glucose readings to her appointment. Continue checking daily-fasting and after largest meal  Avoid large amounts of carbohydrates with meals -ie. Pies, cakes, cooking, pasta, rice, bread

## 2019-02-20 NOTE — Telephone Encounter (Signed)
Pt needs refill on Metformin and has TOC appt with DR. Sagardia at [facility] for refill

## 2019-02-20 NOTE — Telephone Encounter (Signed)
phys

## 2019-03-05 ENCOUNTER — Other Ambulatory Visit: Payer: Self-pay | Admitting: Cardiovascular Disease

## 2019-04-14 ENCOUNTER — Encounter: Payer: 59 | Admitting: Emergency Medicine

## 2019-04-23 ENCOUNTER — Telehealth: Payer: Self-pay | Admitting: Physician Assistant

## 2019-04-23 NOTE — Telephone Encounter (Signed)
° ° ° °  MyChart VIDEO APP visit on 04/25/2019     Consent sent via MyChart        -     04/23/2019

## 2019-04-24 NOTE — Progress Notes (Signed)
Virtual Visit via Video Note   This visit type was conducted due to national recommendations for restrictions regarding the COVID-19 Pandemic (e.g. social distancing) in an effort to limit this patient's exposure and mitigate transmission in our community.  Due to her co-morbid illnesses, this patient is at least at moderate risk for complications without adequate follow up.  This format is felt to be most appropriate for this patient at this time.  All issues noted in this document were discussed and addressed.  A limited physical exam was performed with this format.  Please refer to the patient's chart for her consent to telehealth for [facility]   Date:  04/25/2019   ID:  Julia [name], DOB [birth year], MRN [id number]  Patient Location: Home Provider Location: Home  PCP:  Sherren Mocha, MD  Cardiologist:  [name], MD   Electrophysiologist:  None   Evaluation Performed:  Follow-Up Visit  Chief Complaint: Follow-up on supraventricular tachycardia  History of Present Illness:    Julia [name] is a 48 y.o. female with paroxysmal supraventricular tachycardia status post radiofrequency catheter ablation with Dr. Ladona Ridgel in 2016, diabetes, hypertension, hyperlipidemia.  She was last seen by Dr. Elease Hashimoto in December 2018.  Today, she notes she is doing well.  She is a Airline pilot for [employer].  She has been working from home over the past couple of months.  She notes occasional episodes of palpitations.  This pattern has been stable over the past few years without significant change.  She rarely takes as needed propranolol.  She notes more palpitations if she misses a dose of metoprolol succinate.  She had an episode of chest discomfort that was reproduced with positional changes a couple of nights ago.  Her blood pressure was elevated during this episode.  She has not had any further symptoms.  She has not had shortness of breath, orthopnea, syncope.  She takes Lasix for leg  swelling.  The patient does not have symptoms concerning for COVID-19 infection (fever, chills, cough, or new shortness of breath).    Past Medical History:  Diagnosis Date  . Complication of anesthesia    I had a reaction to a epidural "broke out"  . Condyloma acuminatum of vulva   . GERD (gastroesophageal reflux disease)   . History of supraventricular tachycardia   . HSV-1 infection   . HSV-2 infection   . HTN (hypertension)   . Hyperlipidemia   . Type 2 diabetes mellitus (HCC)    Past Surgical History:  Procedure Laterality Date  . CARDIOVASCULAR STRESS TEST  06-04-2007   normal nuclear study/  no ischemia/  normal LVF, ef 63%  . CESAREAN SECTION  09-06-2005  . LASER ABLATION CONDOLAMATA N/A 10/08/2015   Procedure: CO2 LASER ABLATION CONDOLAMATA OF VULVA;  Surgeon: Richarda Overlie, MD;  Location: [facility];  Service: Gynecology;  Laterality: N/A;  . SUPRAVENTRICULAR TACHYCARDIA ABLATION N/A 02/24/2015   Procedure: SUPRAVENTRICULAR TACHYCARDIA ABLATION;  Surgeon: Marinus Maw, MD;  Location: [facility] CATH LAB;  Service: Cardiovascular;  Laterality: N/A;  . TRANSTHORACIC ECHOCARDIOGRAM  01-06-2009   normal LVF, ef 55-60%,  trace MR and TR/  normal stress echo     Current Meds  Medication Sig  . Clobetasol Propionate 0.05 % shampoo   . Dulaglutide (TRULICITY) 0.75 MG/0.5ML SOPN Inject 0.75 mg into the skin once a week.  . fluticasone (FLONASE) 50 MCG/ACT nasal spray Place 2 sprays into both nostrils daily.  . furosemide (LASIX) 40  MG tablet Take 1 tablet (40 mg total) by mouth daily.  Marland Kitchen glucose blood (ONE TOUCH ULTRA TEST) test strip USE AS DIRECTED  . levothyroxine (SYNTHROID, LEVOTHROID) 75 MCG tablet TAKE 1 TABLET BY MOUTH EVERY DAY  . loratadine (CLARITIN) 10 MG tablet Take 10 mg by mouth daily.  . metFORMIN (GLUCOPHAGE) 1000 MG tablet Take 1 tablet (1,000 mg total) by mouth 2 (two) times daily with a meal.  . potassium chloride SA (K-DUR,KLOR-CON) 20 MEQ  tablet Take 1 tablet (20 mEq total) by mouth daily.  . propranolol (INDERAL) 10 MG tablet Take 10 mg by mouth 4 (four) times daily as needed (heart papatations).   . [DISCONTINUED] propranolol (INDERAL) 10 MG tablet Take 1 tablet (10 mg total) by mouth 4 (four) times daily as needed. (Patient taking differently: Take 10 mg by mouth 4 (four) times daily as needed (heart papatation). )     Allergies:   Sulfa antibiotics; Sulfur; Trulicity [dulaglutide]; Hctz [hydrochlorothiazide]; and Invokana [canagliflozin]   Social History   Tobacco Use  . Smoking status: Former Smoker    Years: 8.00    Types: Cigarettes    Last attempt to quit: 12/16/2002    Years since quitting: 16.3  . Smokeless tobacco: Never Used  Substance Use Topics  . Alcohol use: Yes    Alcohol/week: 2.0 standard drinks    Types: 2 Standard drinks or equivalent per week    Comment: rare  . Drug use: No     Family Hx: The patient's family history includes Diabetes in her maternal grandmother and mother; Hyperlipidemia in an other family member; Hypertension in her mother; Thyroid disease in her mother, sister, and another family member.  ROS:   Please see the history of present illness.     All other systems reviewed and are negative.   Prior CV studies:   The following studies were reviewed today:   Echo 02/07/2017 Normal systolic function, normal wall motion  Event monitor 12/07/2016 NSR with rare PVCs; no significant arrhythmias  Stress echo 01/08/2009 Normal stress echo  Myoview 06/04/2007 No ischemia, EF 63  Labs/Other Tests and Data Reviewed:    EKG:  No ECG reviewed.  Recent Labs: No results found for requested labs within last 8760 hours.   Recent Lipid Panel Lab Results  Component Value Date/Time   CHOL 197 06/09/2017 08:34 AM   TRIG 640 (HH) 06/09/2017 08:34 AM   HDL 29 (L) 06/09/2017 08:34 AM   CHOLHDL 6.8 (H) 06/09/2017 08:34 AM   CHOLHDL 8.0 (H) 04/15/2016 09:12 AM   LDLCALC Comment  06/09/2017 08:34 AM     Wt Readings from Last 3 Encounters:  04/25/19 174 lb 8 oz (79.2 kg)  03/15/18 178 lb 3.2 oz (80.8 kg)  11/20/17 179 lb 1.9 oz (81.2 kg)     Objective:    Vital Signs:  BP 124/80   Pulse 80   Ht 5\' 4"  (1.626 m)   Wt 174 lb 8 oz (79.2 kg)   BMI 29.95 kg/m    VITAL SIGNS:  reviewed GEN:  no acute distress EYES:  sclerae anicteric, EOMI - Extraocular Movements Intact RESPIRATORY:  Normal respiratory effort NEURO:  alert and oriented x 3, no obvious focal deficit PSYCH:  normal affect  ASSESSMENT & PLAN:    SVT (supraventricular tachycardia) (HCC) History of ablation in 2016 by Dr. Ladona Ridgel.  She has occasional palpitations that are overall stable without significant change.  She had an episode of chest discomfort a couple of  nights ago with elevated blood pressures.  I have asked her to continue to monitor symptoms and let us know if this should recur.  Continue current beta-blockeKorear dose.  Follow-up with Dr. Elease HashimotoNahser or me in 1 year.  Essential hypertension Overall, her blood pressure seems to be well controlled.  She does have episodes where her diastolic is in the upper 80s to low 90s.  She takes Lasix for lower extremity swelling.  I have suggested that she watch her blood pressure over the next couple of weeks and send me those readings through MyChart.  If her blood pressure tends to be above target, we could switch Lasix to chlorthalidone or hydrochlorothiazide to see if this controls her blood pressure better.  COVID-19 Education: The signs and symptoms of COVID-19 were discussed with the patient and how to seek care for testing (follow up with PCP or arrange E-visit).  The importance of social distancing was discussed today.  Time:   Today, I have spent 14 minutes with the patient with telehealth technology discussing the above problems.     Medication Adjustments/Labs and Tests Ordered: Current medicines are reviewed at length with the patient today.   Concerns regarding medicines are outlined above.   Tests Ordered: No orders of the defined types were placed in this encounter.   Medication Changes: Meds ordered this encounter  Medications  . furosemide (LASIX) 40 MG tablet    Sig: Take 1 tablet (40 mg total) by mouth daily.    Dispense:  90 tablet    Refill:  3    Order Specific Question:   Supervising Provider    Answer:   Lewayne BuntingRENSHAW, BRIAN S [[id number]]  . metoprolol succinate (TOPROL-XL) 50 MG 24 hr tablet    Sig: Take 1 tablet (50 mg total) by mouth 2 (two) times a day. Take with or immediately following a meal.    Dispense:  180 tablet    Refill:  3    Order Specific Question:   Supervising Provider    Answer:   Lewayne BuntingRENSHAW, BRIAN S [[id number]]    Disposition:  Follow up in 1 year(s)  Signed, Tereso NewcomerScott Iley Deignan, PA-C  04/25/2019 9:12 AM    [facility]

## 2019-04-25 ENCOUNTER — Encounter: Payer: Self-pay | Admitting: Physician Assistant

## 2019-04-25 ENCOUNTER — Telehealth (INDEPENDENT_AMBULATORY_CARE_PROVIDER_SITE_OTHER): Payer: 59 | Admitting: Physician Assistant

## 2019-04-25 VITALS — BP 124/80 | HR 80 | Ht 64.0 in | Wt 174.5 lb

## 2019-04-25 DIAGNOSIS — I471 Supraventricular tachycardia: Secondary | ICD-10-CM

## 2019-04-25 DIAGNOSIS — Z7189 Other specified counseling: Secondary | ICD-10-CM

## 2019-04-25 DIAGNOSIS — I10 Essential (primary) hypertension: Secondary | ICD-10-CM | POA: Diagnosis not present

## 2019-04-25 MED ORDER — FUROSEMIDE 40 MG PO TABS: 40.0000 mg | ORAL_TABLET | Freq: Every day | ORAL | 3 refills | Status: DC

## 2019-04-25 MED ORDER — METOPROLOL SUCCINATE ER 50 MG PO TB24: 50.0000 mg | ORAL_TABLET | Freq: Two times a day (BID) | ORAL | 3 refills | Status: DC

## 2019-04-25 NOTE — Patient Instructions (Signed)
Medication Instructions:  Continue current medications  If you need a refill on your cardiac medications before your next appointment, please call your pharmacy.   Lab work: None  If you have labs (blood work) drawn today and your tests are completely normal, you will receive your results only by: Marland Kitchen MyChart Message (if you have MyChart) OR . A paper copy in the mail If you have any lab test that is abnormal or we need to change your treatment, we will call you to review the results.  Testing/Procedures: None  Follow-Up: At [facility], you and your health needs are our priority.  As part of our continuing mission to provide you with exceptional heart care, we have created designated Provider Care Teams.  These Care Teams include your primary Cardiologist (physician) and Advanced Practice Providers (APPs -  Physician Assistants and Nurse Practitioners) who all work together to provide you with the care you need, when you need it. You will need a follow up appointment in:  12 months.  Please call our office 2 months in advance to schedule this appointment.  You may see [name], MD or Tereso Newcomer, PA-C   Any Other Special Instructions Will Be Listed Below (If Applicable).  Check your blood pressure twice a day over the next few weeks and send those readings through MyChart for me to review.

## 2019-09-19 ENCOUNTER — Other Ambulatory Visit: Payer: Self-pay | Admitting: Cardiovascular Disease

## 2019-11-01 ENCOUNTER — Other Ambulatory Visit: Payer: Self-pay | Admitting: Cardiovascular Disease

## 2019-12-24 ENCOUNTER — Other Ambulatory Visit: Payer: Self-pay

## 2019-12-24 MED ORDER — METOPROLOL SUCCINATE ER 50 MG PO TB24: 50.0000 mg | ORAL_TABLET | Freq: Two times a day (BID) | ORAL | 1 refills | Status: DC

## 2020-01-22 ENCOUNTER — Other Ambulatory Visit: Payer: Self-pay

## 2020-01-22 ENCOUNTER — Encounter (HOSPITAL_COMMUNITY): Payer: Self-pay

## 2020-01-22 ENCOUNTER — Emergency Department (HOSPITAL_COMMUNITY): Payer: 59

## 2020-01-22 ENCOUNTER — Emergency Department (HOSPITAL_COMMUNITY)
Admission: EM | Admit: 2020-01-22 | Discharge: 2020-01-22 | Disposition: A | Discharge status: Home / Self Care | Payer: 59 | Attending: Emergency Medicine | Admitting: Emergency Medicine

## 2020-01-22 DIAGNOSIS — Z79899 Other long term (current) drug therapy: Secondary | ICD-10-CM | POA: Diagnosis not present

## 2020-01-22 DIAGNOSIS — Z7984 Long term (current) use of oral hypoglycemic drugs: Secondary | ICD-10-CM | POA: Diagnosis not present

## 2020-01-22 DIAGNOSIS — U071 COVID-19: Secondary | ICD-10-CM | POA: Insufficient documentation

## 2020-01-22 DIAGNOSIS — R509 Fever, unspecified: Secondary | ICD-10-CM | POA: Diagnosis present

## 2020-01-22 DIAGNOSIS — E860 Dehydration: Secondary | ICD-10-CM | POA: Diagnosis not present

## 2020-01-22 DIAGNOSIS — I10 Essential (primary) hypertension: Secondary | ICD-10-CM | POA: Insufficient documentation

## 2020-01-22 DIAGNOSIS — R110 Nausea: Secondary | ICD-10-CM

## 2020-01-22 DIAGNOSIS — Z87891 Personal history of nicotine dependence: Secondary | ICD-10-CM | POA: Insufficient documentation

## 2020-01-22 DIAGNOSIS — R112 Nausea with vomiting, unspecified: Secondary | ICD-10-CM

## 2020-01-22 DIAGNOSIS — N3000 Acute cystitis without hematuria: Secondary | ICD-10-CM | POA: Diagnosis not present

## 2020-01-22 DIAGNOSIS — E119 Type 2 diabetes mellitus without complications: Secondary | ICD-10-CM | POA: Diagnosis not present

## 2020-01-22 DIAGNOSIS — R197 Diarrhea, unspecified: Secondary | ICD-10-CM

## 2020-01-22 DIAGNOSIS — Z20822 Contact with and (suspected) exposure to covid-19: Secondary | ICD-10-CM

## 2020-01-22 LAB — CBC
HCT: 38.2 % (ref 36.0–46.0)
Hemoglobin: 12.3 g/dL (ref 12.0–15.0)
MCH: 25.4 pg — ABNORMAL LOW (ref 26.0–34.0)
MCHC: 32.2 g/dL (ref 30.0–36.0)
MCV: 78.8 fL — ABNORMAL LOW (ref 80.0–100.0)
Platelets: 234 10*3/uL (ref 150–400)
RBC: 4.85 MIL/uL (ref 3.87–5.11)
RDW: 13.7 % (ref 11.5–15.5)
WBC: 8.5 10*3/uL (ref 4.0–10.5)
nRBC: 0 % (ref 0.0–0.2)

## 2020-01-22 LAB — URINALYSIS, ROUTINE W REFLEX MICROSCOPIC
Bilirubin Urine: NEGATIVE
Glucose, UA: >=500 mg/dL — AB
Ketones, ur: 20 mg/dL — AB
Nitrite: NEGATIVE
Protein, ur: 100 mg/dL — AB
Specific Gravity, Urine: 1.028 (ref 1.005–1.030)
WBC, UA: >50 WBC/hpf — ABNORMAL HIGH (ref 0–5)
pH: 6 (ref 5.0–8.0)

## 2020-01-22 LAB — COMPREHENSIVE METABOLIC PANEL
ALT: 16 U/L (ref 0–44)
AST: 20 U/L (ref 15–41)
Albumin: 3.6 g/dL (ref 3.5–5.0)
Alkaline Phosphatase: 128 U/L — ABNORMAL HIGH (ref 38–126)
Anion gap: 11 (ref 5–15)
BUN: 11 mg/dL (ref 6–20)
CO2: 24 mmol/L (ref 22–32)
Calcium: 8.6 mg/dL — ABNORMAL LOW (ref 8.9–10.3)
Chloride: 96 mmol/L — ABNORMAL LOW (ref 98–111)
Creatinine, Ser: 0.62 mg/dL (ref 0.44–1.00)
GFR calc Af Amer: >60 mL/min (ref >60)
GFR calc non Af Amer: >60 mL/min (ref >60)
Glucose, Bld: 407 mg/dL — ABNORMAL HIGH (ref 70–99)
Potassium: 3.3 mmol/L — ABNORMAL LOW (ref 3.5–5.1)
Sodium: 131 mmol/L — ABNORMAL LOW (ref 135–145)
Total Bilirubin: 0.7 mg/dL (ref 0.3–1.2)
Total Protein: 7 g/dL (ref 6.5–8.1)

## 2020-01-22 LAB — LIPASE, BLOOD: Lipase: 18 U/L (ref 11–51)

## 2020-01-22 LAB — LACTIC ACID, PLASMA: Lactic Acid, Venous: 1.2 mmol/L (ref 0.5–1.9)

## 2020-01-22 MED ORDER — ACETAMINOPHEN 500 MG PO TABS
1000.0000 mg | ORAL_TABLET | Freq: Once | ORAL | Status: AC
  Administered 2020-01-22: 1000 mg via ORAL
  Filled 2020-01-22: qty 2

## 2020-01-22 MED ORDER — CEPHALEXIN 500 MG PO CAPS
500.0000 mg | ORAL_CAPSULE | Freq: Once | ORAL | Status: AC
  Administered 2020-01-22: 500 mg via ORAL
  Filled 2020-01-22: qty 1

## 2020-01-22 MED ORDER — POTASSIUM CHLORIDE CRYS ER 20 MEQ PO TBCR
40.0000 meq | EXTENDED_RELEASE_TABLET | Freq: Once | ORAL | Status: AC
  Administered 2020-01-22: 40 meq via ORAL
  Filled 2020-01-22: qty 2

## 2020-01-22 MED ORDER — ONDANSETRON HCL 4 MG/2ML IJ SOLN
4.0000 mg | Freq: Once | INTRAMUSCULAR | Status: AC
  Administered 2020-01-22: 4 mg via INTRAVENOUS
  Filled 2020-01-22: qty 2

## 2020-01-22 MED ORDER — SODIUM CHLORIDE 0.9% FLUSH
3.0000 mL | Freq: Once | INTRAVENOUS | Status: AC
  Administered 2020-01-22: 3 mL via INTRAVENOUS

## 2020-01-22 MED ORDER — CEPHALEXIN 500 MG PO CAPS: 500.0000 mg | ORAL_CAPSULE | Freq: Four times a day (QID) | ORAL | 0 refills | Status: AC

## 2020-01-22 MED ORDER — PHENAZOPYRIDINE HCL 200 MG PO TABS: 200.0000 mg | ORAL_TABLET | Freq: Three times a day (TID) | ORAL | 0 refills | Status: DC

## 2020-01-22 MED ORDER — SODIUM CHLORIDE 0.9 % IV BOLUS: 1000.0000 mL | Freq: Once | INTRAVENOUS | Status: DC

## 2020-01-22 MED ORDER — SODIUM CHLORIDE 0.9 % IV BOLUS
1000.0000 mL | Freq: Once | INTRAVENOUS | Status: AC
  Administered 2020-01-22: 1000 mL via INTRAVENOUS

## 2020-01-22 MED ORDER — ONDANSETRON 4 MG PO TBDP: 4.0000 mg | ORAL_TABLET | Freq: Three times a day (TID) | ORAL | 0 refills | Status: DC | PRN

## 2020-01-22 MED ORDER — AZITHROMYCIN 250 MG PO TABS: 250.0000 mg | ORAL_TABLET | Freq: Every day | ORAL | 0 refills | Status: DC

## 2020-01-22 MED ORDER — LACTATED RINGERS IV BOLUS: 1000.0000 mL | Freq: Once | INTRAVENOUS | Status: DC

## 2020-01-22 NOTE — ED Notes (Signed)
Pt given diet gingerale for fluid po challenge, will reasses in a few minutes.

## 2020-01-22 NOTE — ED Provider Notes (Addendum)
[facility] DEPT Provider Note   CSN: [id number] Arrival date & time: 01/22/20  1646     History Chief Complaint  Patient presents with  . Emesis  . Fever  . Diarrhea    Julia [name] is a 49 y.o. female.  HPI Patient is a 49 year old female with a history of hypertension, DM 2, SVT--status post ablation and on metoprolol.  Patient presents today with chief complaint of "shaking."  Patient describes these episodes as shivering and she states she feels cold during for several minutes which makes her feel upset her stomach will vomit.  Patient states that she is had 3 of these episodes since last night.  She states vomiting generally occurs after she has an episode of shaking/shivering.  She states that her vomit is nonbloody, nonbilious and it is clear/mostly whenever she attempted to eat.  She states that she has had diarrhea as well.  States that all of this began yesterday approximately 3 hours after she ate a steak dinner at a restaurant with her husband.  She states that she has not traveled recently other than a trip to [place] 2 weeks ago.  She denies any known sick contacts.  Denies any loss of taste or smell.  Denies any cough, abdominal pain, chest pain, shortness of breath, headache, dizziness.  Patient does state that she has been having some burning of urination for the past 3 days.  She states it is a feeling that she has more frequency and urgency as well.  She states she did have a headache earlier today but that went away after she took a nap.  She states that she took some Phenergan that her husband prescribed to him which she states completely took away her nausea allowed her to sleep "all day "she states after she woke up several hours later she started feeling nauseous again and had another episode of feeling chilled.  She does state that she had some discomfort after urinating few days ago and was concerned that she was having an  urinary tract infection however she states she has not noticed this today.  She states that she has a history of chronic congestion and uses saline rinses frequently.  No recent antibiotic usage, no history of C. Diff.      Past Medical History:  Diagnosis Date  . Complication of anesthesia    I had a reaction to a epidural "broke out"  . Condyloma acuminatum of vulva   . GERD (gastroesophageal reflux disease)   . History of supraventricular tachycardia   . HSV-1 infection   . HSV-2 infection   . HTN (hypertension)   . Hyperlipidemia   . Type 2 diabetes mellitus [facility])     Patient Active Problem List   Diagnosis Date Noted  . Nasal sinus congestion 03/15/2018  . Subclinical hypothyroidism 08/05/2017  . Hyperlipidemia 07/28/2016  . Type 2 diabetes mellitus with hyperglycemia, without long-term current use of insulin ([place]) 04/15/2016  . Sinus tachycardia 11/12/2015  . S/P radiofrequency ablation operation for arrhythmia 02/24/2015  . SVT (supraventricular tachycardia) ([place]) 08/13/2014  . Palpitations 08/13/2014  . Obesity, Class I, BMI 30-34.9 09/30/2013  . HTN (hypertension) 08/01/2012    Past Surgical History:  Procedure Laterality Date  . CARDIOVASCULAR STRESS TEST  06-04-2007   normal nuclear study/  no ischemia/  normal LVF, ef 63%  . CESAREAN SECTION  09-06-2005  . LASER ABLATION CONDOLAMATA N/A 10/08/2015   Procedure: CO2 LASER ABLATION CONDOLAMATA OF  VULVA;  Surgeon: Molli Posey, MD;  Location: [facility];  Service: Gynecology;  Laterality: N/A;  . SUPRAVENTRICULAR TACHYCARDIA ABLATION N/A 02/24/2015   Procedure: SUPRAVENTRICULAR TACHYCARDIA ABLATION;  Surgeon: Evans Lance, MD;  Location: [facility] CATH LAB;  Service: Cardiovascular;  Laterality: N/A;  . TRANSTHORACIC ECHOCARDIOGRAM  01-06-2009   normal LVF, ef 55-60%,  trace MR and TR/  normal stress echo     OB History   No obstetric history on file.     Family History  Problem Relation Age of  Onset  . Hypertension Mother   . Thyroid disease Mother   . Diabetes Mother   . Thyroid disease Sister   . Diabetes Maternal Grandmother   . Hyperlipidemia Other   . Thyroid disease Other     Social History   Tobacco Use  . Smoking status: Former Smoker    Years: 8.00    Types: Cigarettes    Quit date: 12/16/2002    Years since quitting: 17.1  . Smokeless tobacco: Never Used  Substance Use Topics  . Alcohol use: Yes    Alcohol/week: 2.0 standard drinks    Types: 2 Standard drinks or equivalent per week    Comment: rare  . Drug use: No    Home Medications Prior to Admission medications   Medication Sig Start Date End Date Taking? Authorizing Provider  cephALEXin (KEFLEX) 500 MG capsule Take 1 capsule (500 mg total) by mouth 4 (four) times daily for 7 days. 01/22/20 01/29/20  Tedd Sias, PA  Clobetasol Propionate 0.05 % shampoo  11/29/18   [provider]  Dulaglutide (TRULICITY) 5.02 DX/4.1OI SOPN Inject 0.75 mg into the skin once a week. 03/15/18   Shawnee Knapp, MD  fluticasone (FLONASE) 50 MCG/ACT nasal spray Place 2 sprays into both nostrils daily. 03/12/15   [provider]  furosemide (LASIX) 40 MG tablet Take 1 tablet (40 mg total) by mouth daily. 04/25/19 04/24/20  Richardson Dopp T, PA-C  glucose blood (ONE TOUCH ULTRA TEST) test strip USE AS DIRECTED 10/04/17   Philemon Kingdom, MD  levothyroxine (SYNTHROID, LEVOTHROID) 75 MCG tablet TAKE 1 TABLET BY MOUTH EVERY DAY 11/01/18   Shawnee Knapp, MD  loratadine (CLARITIN) 10 MG tablet Take 10 mg by mouth daily.    [provider]  metFORMIN (GLUCOPHAGE) 1000 MG tablet Take 1 tablet (1,000 mg total) by mouth 2 (two) times daily with a meal. 02/20/19   Corum, Rex Kras, MD  metoprolol succinate (TOPROL-XL) 50 MG 24 hr tablet Take 1 tablet (50 mg total) by mouth 2 (two) times daily. With or immediately following a meal. 12/24/19   Nahser, Wonda Cheng, MD  ondansetron (ZOFRAN ODT) 4 MG disintegrating tablet Take 1  tablet (4 mg total) by mouth every 8 (eight) hours as needed for nausea or vomiting. 01/22/20   Tedd Sias, PA  phenazopyridine (PYRIDIUM) 200 MG tablet Take 1 tablet (200 mg total) by mouth 3 (three) times daily. 01/22/20   Tedd Sias, PA  potassium chloride SA (K-DUR,KLOR-CON) 20 MEQ tablet Take 1 tablet (20 mEq total) by mouth daily. 03/15/18   Shawnee Knapp, MD  propranolol (INDERAL) 10 MG tablet Take 10 mg by mouth 4 (four) times daily as needed (heart papatations).     [provider]    Allergies    Sulfa antibiotics, Sulfur, Trulicity [dulaglutide], Hctz [hydrochlorothiazide], and Invokana [canagliflozin]  Review of Systems   Review of Systems  Constitutional: Positive for chills, fatigue  and fever.  HENT: Negative for congestion.   Eyes: Negative for pain.  Respiratory: Negative for cough and shortness of breath.   Cardiovascular: Negative for chest pain and leg swelling.  Gastrointestinal: Positive for diarrhea, nausea and vomiting. Negative for abdominal pain.  Genitourinary: Positive for dysuria, frequency and urgency.  Musculoskeletal: Negative for myalgias.  Skin: Negative for rash.  Neurological: Positive for headaches. Negative for dizziness.    Physical Exam Updated Vital Signs BP 120/86 (BP Location: Right Arm)   Pulse 100   Temp 98 F (36.7 C) (Oral)   Resp 18   Ht _0  (1.626 m)   Wt 77.1 kg   LMP 07/22/2019   SpO2 95%   BMI 29.18 kg/m   Physical Exam Vitals and nursing note reviewed.  Constitutional:      General: She is not in acute distress. HENT:     Head: Normocephalic and atraumatic.     Nose: Nose normal.  Eyes:     General: No scleral icterus. Cardiovascular:     Rate and Rhythm: Regular rhythm. Tachycardia present.     Pulses: Normal pulses.     Heart sounds: Normal heart sounds.     Comments: Tachycardia 116 on my exam Pulmonary:     Effort: Pulmonary effort is normal. No respiratory distress.     Breath sounds: No  wheezing.     Comments: Faint crackles auscultated bilateral lung bases. Otherwise clear to auscultation bilaterally.  No increased work of breathing.  Speaking in full sentences without difficulty.  Not tachypneic. Abdominal:     Palpations: Abdomen is soft.     Tenderness: There is no abdominal tenderness. There is no right CVA tenderness, left CVA tenderness, guarding or rebound.     Comments: Abdomen is soft, overweight, no focal tenderness, no rebound, guarding, CVA tenderness.  Musculoskeletal:     Cervical back: Normal range of motion and neck supple. No rigidity.     Right lower leg: No edema.     Left lower leg: No edema.  Skin:    General: Skin is warm and dry.     Capillary Refill: Capillary refill takes less than 2 seconds.  Neurological:     Mental Status: She is alert. Mental status is at baseline.  Psychiatric:        Mood and Affect: Mood normal.        Behavior: Behavior normal.     ED Results / Procedures / Treatments   Labs (all labs ordered are listed, but only abnormal results are displayed) Labs Reviewed  COMPREHENSIVE METABOLIC PANEL - Abnormal; Notable for the following components:      Result Value   Sodium 131 (*)    Potassium 3.3 (*)    Chloride 96 (*)    Glucose, Bld 407 (*)    Calcium 8.6 (*)    Alkaline Phosphatase 128 (*)    All other components within normal limits  CBC - Abnormal; Notable for the following components:   MCV 78.8 (*)    MCH 25.4 (*)    All other components within normal limits  URINALYSIS, ROUTINE W REFLEX MICROSCOPIC - Abnormal; Notable for the following components:   APPearance CLOUDY (*)    Glucose, UA >=500 (*)    Hgb urine dipstick MODERATE (*)    Ketones, ur 20 (*)    Protein, ur 100 (*)    Leukocytes,Ua MODERATE (*)    WBC, UA >50 (*)    Bacteria, UA RARE (*)  All other components within normal limits  SARS CORONAVIRUS 2 (TAT 6-24 HRS)  LIPASE, BLOOD  LACTIC ACID, PLASMA    EKG EKG  Interpretation  Date/Time:  Thursday January 22 2020 17:47:43 EST Ventricular Rate:  106 PR Interval:    QRS Duration: 85 QT Interval:  331 QTC Calculation: 440 R Axis:   3 Text Interpretation: Sinus tachycardia Low voltage, precordial leads Borderline T wave abnormalities No significant change since 8/17 Confirmed by Aletta Edouard [phone]) on 01/22/2020 6:15:02 PM   Radiology DG Chest Portable 1 View  Result Date: 01/22/2020 CLINICAL DATA:  49 year old female with fever and vomiting. EXAM: PORTABLE CHEST 1 VIEW COMPARISON:  Chest radiograph dated 07/30/2016. FINDINGS: Minimal linear densities at the left lung base may represent atelectasis. Atypical infection is less likely but not excluded. Clinical correlation is recommended. No focal consolidation, pleural effusion, or pneumothorax. The cardiac silhouette is within normal limits. No acute osseous pathology. IMPRESSION: No focal consolidation. Minimal left lung base atelectasis versus less likely developing infiltrate. Electronically Signed   By: Anner Crete M.D.   On: 01/22/2020 18:24    Procedures Procedures (including critical care time)  Medications Ordered in ED Medications  sodium chloride flush (NS) 0.9 % injection 3 mL (3 mLs Intravenous Given 01/22/20 1823)  sodium chloride 0.9 % bolus 1,000 mL (0 mLs Intravenous Stopped 01/22/20 2043)  ondansetron (ZOFRAN) injection 4 mg (4 mg Intravenous Given 01/22/20 1823)  acetaminophen (TYLENOL) tablet 1,000 mg (1,000 mg Oral Given 01/22/20 1823)  potassium chloride SA (KLOR-CON) CR tablet 40 mEq (40 mEq Oral Given 01/22/20 2040)  cephALEXin (KEFLEX) capsule 500 mg (500 mg Oral Given 01/22/20 2040)    ED Course  I have reviewed the triage vital signs and the nursing notes.  Pertinent labs & imaging results that were available during my care of the patient were reviewed by me and considered in my medical decision making (see chart for details).  Patient is a 49 year old female  with no past medical history significant for SVT, DM 2, hyperlipidemia, HTN.  She is presented today for nausea vomiting diarrhea and dysuria.  She is found to have a elevated temperature and was tachycardic when she was triaged.  She states that she has not been able to tolerate p.o. for the past 2 days.  She states she feels very dehydrated has a dry mouth.  She is diabetic and states that she has not used her Trulicity with it as prescribed.  Clinical Course as of Jan 21 2111  Thu Feb 18, 10382  3637 49 year old female here with what sounds like rigors last night along with vomiting and diarrhea.  Febrile here to 101.6 tachycardic.  Blood pressure okay.  No cough or upper respiratory symptoms.  Getting chest x-ray labs EKG IV fluids antipyretic Covid testing.   [MB]  1933 CMP notable for elevated blood sugar 407.  Mildly low sodium potassium consistent with patient's diarrhea and dehydration.  Comprehensive metabolic panel(!) [WF]  1308 Lactic acid within normal limits doubt the patient is septic.  She has no leukocytosis either.  Tachycardia slowly resolving with fluids and temperature is improving after Tylenol.  Lipase is within normal limits.  Patient has chronic alk phos elevation over the past 2 years.  Doubt any acute process causing her possibility today.   [WF]    Clinical Course User Index [MB] Hayden Rasmussen, MD [WF] Pati Gallo S, [place]   Urinalysis appears to be acutely affected has not contaminated.  Will treat UTI  with Keflex.  Discussed results of all labs with patient.  I shared with her that I suspect that she has a viral gastroenteritis however she does have urinary symptoms and evidence of urinary tract infection on urinalysis we will treat with antibiotics for that.  I discussed her chest x-ray finding which shows small atelectatic area is unlikely to be an infiltrate.  As she states that she does not have any shortness of breath cough or pleuritic chest pain I highly doubt  that patient has pneumonia.  Shared decision making a physician of patient about whether to treat with antibiotics-she states she prefer to defer antibiotic treatment at this time.  Lengthy shared decision-making physician with patient about her elevated blood sugar.  She states that "I have not been taking care of myself we need to "she does understand that she needs to have a more active role in managing her blood sugar.  She is tolerating p.o. at this time states that she would prefer to drink some p.o. fluid and be discharged with Keflex and to stay for another fluid bolus.  Her heart rate has normalized to 94 on my exam.  Her temperature is 99.4 and blood pressure is within normal limits.  Her SPO2 is 96% on room air as well.  Julia [name] was evaluated in Emergency Department on 01/22/2020 for the symptoms described in the history of present illness. She was evaluated in the context of the global COVID-19 pandemic, which necessitated consideration that the patient might be at risk for infection with the SARS-CoV-2 virus that causes COVID-19. Institutional protocols and algorithms that pertain to the evaluation of patients at risk for COVID-19 are in a state of rapid change based on information released by regulatory bodies including the CDC and federal and state organizations. These policies and algorithms were followed during the patient's care in the ED. I discussed this case with my attending physician who cosigned this note including patient's presenting symptoms, physical exam, and planned diagnostics and interventions. Attending physician stated agreement with plan or made changes to plan which were implemented.  The medical records were personally reviewed by myself. I personally reviewed all lab results and interpreted all imaging studies and either concurred with their official read or contacted radiology for clarification.   This patient appears reasonably screened and I doubt any other medical  condition requiring further workup, evaluation, or treatment in the ED at this time prior to discharge.   Patient's vitals are WNL apart from vital sign abnormalities discussed above, patient is in NAD, and able to ambulate in the ED at their baseline and able to tolerate PO.  Pain has been managed or a plan has been made for home management and has no complaints prior to discharge. Patient is comfortable with above plan and for discharge at this time. All questions were answered prior to disposition. Results from the ER workup discussed with the patient face to face and all questions answered to the best of my ability. The patient is safe for discharge with strict return precautions. Patient appears safe for discharge with appropriate follow-up. Conveyed my impression with the patient and they voiced understanding and are agreeable to plan.   An After Visit Summary was printed and given to the patient.  Portions of this note were generated with [employer]. Dictation errors may occur despite best attempts at proofreading.    MDM Rules/Calculators/A&P  Final Clinical Impression(s) / ED Diagnoses Final diagnoses:  Dehydration  Suspected COVID-19 virus infection  Nausea  Diarrhea, unspecified type  Acute cystitis without hematuria  Non-intractable vomiting with nausea, unspecified vomiting type    Rx / DC Orders ED Discharge Orders         Ordered    ondansetron (ZOFRAN ODT) 4 MG disintegrating tablet  Every 8 hours PRN     01/22/20 2003    azithromycin (ZITHROMAX) 250 MG tablet  Daily,   Status:  Discontinued     01/22/20 2003    cephALEXin (KEFLEX) 500 MG capsule  4 times daily     01/22/20 2006    phenazopyridine (PYRIDIUM) 200 MG tablet  3 times daily     01/22/20 2013           Tedd Sias, [place] 01/22/20 2021    Tedd Sias, [place] 01/22/20 2112    Hayden Rasmussen, MD 01/23/20 1036

## 2020-01-22 NOTE — ED Triage Notes (Signed)
Patient c/o fever, emesis, and diarrhea since last night. Patient states she went to [place], [place] 1 1/2 weeks ago.

## 2020-01-22 NOTE — Discharge Instructions (Addendum)
Take tylenol for fever. I recommend getting an oral thermometer.   Please use Tylenol or ibuprofen for pain.  1000 mg of Tylenol every 6 hours.   Not to exceed 4 g of Tylenol within 24 hours.   Please take keflex as prescribed.  Your urine was concerning for urinary tract infection.  He may use Pyridium for symptoms.  I have prescribed this you as well.

## 2020-01-23 ENCOUNTER — Telehealth (HOSPITAL_COMMUNITY): Payer: Self-pay

## 2020-01-23 LAB — SARS CORONAVIRUS 2 (TAT 6-24 HRS): SARS Coronavirus 2: POSITIVE — AB

## 2020-01-24 ENCOUNTER — Telehealth: Payer: Self-pay | Admitting: Physician Assistant

## 2020-01-24 ENCOUNTER — Other Ambulatory Visit: Payer: Self-pay | Admitting: Physician Assistant

## 2020-01-24 DIAGNOSIS — U071 COVID-19: Secondary | ICD-10-CM

## 2020-01-24 DIAGNOSIS — E1165 Type 2 diabetes mellitus with hyperglycemia: Secondary | ICD-10-CM

## 2020-01-24 NOTE — Progress Notes (Signed)
  I connected by phone with Julia [name] on 01/24/2020 at 7:51 PM to discuss the potential use of an new treatment for mild to moderate COVID-19 viral infection in non-hospitalized patients.  This patient is a 49 y.o. female that meets the FDA criteria for Emergency Use Authorization of bamlanivimab or casirivimab\imdevimab.  Has a (+) direct SARS-CoV-2 viral test result  Has mild or moderate COVID-19   Is ? 49 years of age and weighs ? 40 kg  Is NOT hospitalized due to COVID-19  Is NOT requiring oxygen therapy or requiring an increase in baseline oxygen flow rate due to COVID-19  Is within 10 days of symptom onset  Has at least one of the high risk factor(s) for progression to severe COVID-19 and/or hospitalization as defined in EUA.  Specific high risk criteria : Diabetes   I have spoken and communicated the following to the patient or parent/caregiver:  1. FDA has authorized the emergency use of bamlanivimab and casirivimab\imdevimab for the treatment of mild to moderate COVID-19 in adults and pediatric patients with positive results of direct SARS-CoV-2 viral testing who are 40 years of age and older weighing at least 40 kg, and who are at high risk for progressing to severe COVID-19 and/or hospitalization.  2. The significant known and potential risks and benefits of bamlanivimab and casirivimab\imdevimab, and the extent to which such potential risks and benefits are unknown.  3. Information on available alternative treatments and the risks and benefits of those alternatives, including clinical trials.  4. Patients treated with bamlanivimab and casirivimab\imdevimab should continue to self-isolate and use infection control measures (e.g., wear mask, isolate, social distance, avoid sharing personal items, clean and disinfect "high touch" surfaces, and frequent handwashing) according to CDC guidelines.   5. The patient or parent/caregiver has the option to accept or refuse  bamlanivimab or casirivimab\imdevimab .  After reviewing this information with the patient, The patient agreed to proceed with receiving the bamlanimivab infusion and will be provided a copy of the Fact sheet prior to receiving the infusion.   Sx onset 01/21/20. Set up for infusion 01/25/20 @ 10:30am. Directions given.    Cline Crock PA-C 01/24/2020 7:51 PM

## 2020-01-24 NOTE — Telephone Encounter (Signed)
Called to discuss with patient about Covid symptoms and the use of bamlanivimab or casirivimab/imdevimab, a monoclonal antibody infusion for those with mild to moderate Covid symptoms and at a high risk of hospitalization.  Pt is qualified for this infusion at the [facility] infusion center due to Diabetes   Unable to leave message but did send a [employer].   Cline Crock PA-C  [facility]

## 2020-01-25 ENCOUNTER — Ambulatory Visit (HOSPITAL_COMMUNITY)
Admission: RE | Admit: 2020-01-25 | Discharge: 2020-01-25 | Disposition: A | Discharge status: Home / Self Care | Payer: 59 | Source: Ambulatory Visit | Attending: Pulmonary Disease | Admitting: Pulmonary Disease

## 2020-01-25 ENCOUNTER — Encounter (HOSPITAL_COMMUNITY): Payer: Self-pay

## 2020-01-25 DIAGNOSIS — U071 COVID-19: Secondary | ICD-10-CM

## 2020-01-25 DIAGNOSIS — E1165 Type 2 diabetes mellitus with hyperglycemia: Secondary | ICD-10-CM | POA: Diagnosis present

## 2020-01-25 MED ORDER — ALBUTEROL SULFATE HFA 108 (90 BASE) MCG/ACT IN AERS: 2.0000 | INHALATION_SPRAY | Freq: Once | RESPIRATORY_TRACT | Status: DC | PRN

## 2020-01-25 MED ORDER — EPINEPHRINE 0.3 MG/0.3ML IJ SOAJ: 0.3000 mg | Freq: Once | INTRAMUSCULAR | Status: DC | PRN

## 2020-01-25 MED ORDER — FAMOTIDINE IN NACL 20-0.9 MG/50ML-% IV SOLN: 20.0000 mg | Freq: Once | INTRAVENOUS | Status: DC | PRN

## 2020-01-25 MED ORDER — DIPHENHYDRAMINE HCL 50 MG/ML IJ SOLN: 50.0000 mg | Freq: Once | INTRAMUSCULAR | Status: DC | PRN

## 2020-01-25 MED ORDER — SODIUM CHLORIDE 0.9 % IV SOLN
700.0000 mg | Freq: Once | INTRAVENOUS | Status: AC
  Administered 2020-01-25: 11:00:00 700 mg via INTRAVENOUS
  Filled 2020-01-25: qty 20

## 2020-01-25 MED ORDER — METHYLPREDNISOLONE SODIUM SUCC 125 MG IJ SOLR: 125.0000 mg | Freq: Once | INTRAMUSCULAR | Status: DC | PRN

## 2020-01-25 MED ORDER — SODIUM CHLORIDE 0.9 % IV SOLN: INTRAVENOUS | Status: DC | PRN

## 2020-01-25 NOTE — Discharge Instructions (Signed)

## 2020-01-25 NOTE — Progress Notes (Signed)
  Diagnosis: COVID-19  Physician: Dr. Delford Field  Procedure: Covid Infusion Clinic Med: bamlanivimab infusion - Provided patient with bamlanimivab fact sheet for patients, parents and caregivers prior to infusion.  Complications: No immediate complications noted.  Discharge: Discharged home   Julia [name] 01/25/2020

## 2020-02-16 ENCOUNTER — Telehealth (HOSPITAL_COMMUNITY): Payer: Self-pay | Admitting: Family Medicine

## 2020-02-16 ENCOUNTER — Ambulatory Visit (INDEPENDENT_AMBULATORY_CARE_PROVIDER_SITE_OTHER)
Admission: RE | Admit: 2020-02-16 | Discharge: 2020-02-16 | Disposition: A | Discharge status: Home / Self Care | Payer: 59 | Source: Ambulatory Visit

## 2020-02-16 DIAGNOSIS — K121 Other forms of stomatitis: Secondary | ICD-10-CM

## 2020-02-16 DIAGNOSIS — Z8616 Personal history of COVID-19: Secondary | ICD-10-CM

## 2020-02-16 DIAGNOSIS — K1379 Other lesions of oral mucosa: Secondary | ICD-10-CM

## 2020-02-16 DIAGNOSIS — L0501 Pilonidal cyst with abscess: Secondary | ICD-10-CM

## 2020-02-16 DIAGNOSIS — E119 Type 2 diabetes mellitus without complications: Secondary | ICD-10-CM

## 2020-02-16 DIAGNOSIS — S90426A Blister (nonthermal), unspecified lesser toe(s), initial encounter: Secondary | ICD-10-CM | POA: Diagnosis not present

## 2020-02-16 MED ORDER — MAGIC MOUTHWASH: 15.0000 mL | ORAL | 0 refills | Status: DC | PRN

## 2020-02-16 MED ORDER — CEPHALEXIN 500 MG PO CAPS: 500.0000 mg | ORAL_CAPSULE | Freq: Four times a day (QID) | ORAL | 0 refills | Status: DC

## 2020-02-16 NOTE — ED Provider Notes (Signed)
Virtual Visit via Video Note:  Julia [name]  initiated request for Telemedicine visit with [facility] team. I connected with Julia [name]  on 02/16/2020 at 4:07 PM  for a synchronized telemedicine visit using a video enabled HIPPA compliant telemedicine application. I verified that I am speaking with Julia [name]  using two identifiers. Julia Aris, NP  was physically located in a [facility]gent care site and Julia [name] was located at a different location.   The limitations of evaluation and management by telemedicine as well as the availability of in-person appointments were discussed. Patient was informed that she  may incur a bill ( including co-pay) for this virtual visit encounter. Julia [name]  expressed understanding and gave verbal consent to proceed with virtual visit.     History of Present Illness:Julia [name]  is a 49 y.o. female presents with multiple complaints.  Reporting she tested positive for Covid back around February 18.  She did receive the COVID-19 infusion.  About a week after that she started having sores in mouth, rawness and pain in the mouth and lips.  It has been painful to chew gum and eat.  History of HSV 1.  this is been constant.  She has been doing mouthwash, Peridex without much relief.  She is also developed abscess to upper buttocks area.  History of similar in the past.  He reports the area is tender but has not opened up and drained she did poke a hole in the area to relieve some of the pressure.  She also has blister to toe.  Reporting wearing new shoes and rubbing. She is still having some lingering symptoms to include nausea, fatigue.  Denies any fever, chills, body aches, N,V, D, abd pain. No recent new medications besides getting the covid infusion.   Past Medical History:  Diagnosis Date  . Complication of anesthesia    I had a reaction to a epidural "broke out"  . Condyloma acuminatum of vulva   . GERD (gastroesophageal reflux disease)   .  History of supraventricular tachycardia   . HSV-1 infection   . HSV-2 infection   . HTN (hypertension)   . Hyperlipidemia   . Type 2 diabetes mellitus (HCC)     Allergies  Allergen Reactions  . Sulfa Antibiotics Hives  . Sulfur Other (See Comments)    blisters  . Trulicity [Dulaglutide]     Abdominal pain  . Hctz [Hydrochlorothiazide] Rash    Possible photosensitivity dermatitis  . Invokana [Canagliflozin] Rash    Possible photosensitivity dermatitis        Observations/Objective:VITALS: Per patient if applicable, see vitals. GENERAL: Alert, appears well and in no acute distress. HEENT: Atraumatic, conjunctiva clear, no obvious abnormalities on inspection of external nose and ears. Oral lesions on tongue noted. Lips erythematous.  NECK: Normal movements of the head and neck. CARDIOPULMONARY: No increased WOB. Speaking in clear sentences. I:E ratio WNL.  MS: Moves all visible extremities without noticeable abnormality. PSYCH: Pleasant and cooperative, well-groomed. Speech normal rate and rhythm. Affect is appropriate. Insight and judgement are appropriate. Attention is focused, linear, and appropriate.  NEURO: CN grossly intact. Oriented as arrived to appointment on time with no prompting. Moves both UE equally.  SKIN: No obvious lesions, wounds, erythema, or cyanosis noted on face or hands.     Assessment and Plan: stomatitis- treating with magic mouthwash with hydrocortisone, monitor.  Pilonidal abscess- treating with keflex. This will also cover  for the concern for infection on the toe with blister. Pt concerned because she is a diabetic.  Follow up as needed for continued or worsening symptoms   Follow Up Instructions: Recommend follow-up in person for any continued or worsening problems.    I discussed the assessment and treatment plan with the patient. The patient was provided an opportunity to ask questions and all were answered. The patient agreed with the plan and  demonstrated an understanding of the instructions.   The patient was advised to call back or seek an in-person evaluation if the symptoms worsen or if the condition fails to improve as anticipated.    Orvan July, NP  02/16/2020 4:07 PM         Orvan July, NP 02/16/20 1622

## 2020-02-16 NOTE — Telephone Encounter (Signed)
Printing prescription out for pt to pick up

## 2020-02-16 NOTE — Discharge Instructions (Signed)
Treating you for infection and sore/infection in the mouth Take the medication as prescribed.  Follow up as needed for continued or worsening symptoms

## 2020-02-28 ENCOUNTER — Emergency Department (HOSPITAL_COMMUNITY): Payer: 59

## 2020-02-28 ENCOUNTER — Other Ambulatory Visit: Payer: Self-pay

## 2020-02-28 ENCOUNTER — Encounter (HOSPITAL_COMMUNITY): Payer: Self-pay | Admitting: *Deleted

## 2020-02-28 ENCOUNTER — Inpatient Hospital Stay (HOSPITAL_COMMUNITY)
Admission: EM | Admit: 2020-02-28 | Discharge: 2020-03-02 | DRG: 690 | Disposition: A | Discharge status: Home / Self Care | Payer: 59 | Attending: Internal Medicine | Admitting: Internal Medicine

## 2020-02-28 ENCOUNTER — Inpatient Hospital Stay (HOSPITAL_COMMUNITY): Payer: 59

## 2020-02-28 DIAGNOSIS — Z8616 Personal history of COVID-19: Secondary | ICD-10-CM | POA: Diagnosis not present

## 2020-02-28 DIAGNOSIS — E039 Hypothyroidism, unspecified: Secondary | ICD-10-CM | POA: Diagnosis present

## 2020-02-28 DIAGNOSIS — K219 Gastro-esophageal reflux disease without esophagitis: Secondary | ICD-10-CM | POA: Diagnosis present

## 2020-02-28 DIAGNOSIS — R1110 Vomiting, unspecified: Secondary | ICD-10-CM | POA: Diagnosis present

## 2020-02-28 DIAGNOSIS — Z7984 Long term (current) use of oral hypoglycemic drugs: Secondary | ICD-10-CM

## 2020-02-28 DIAGNOSIS — E871 Hypo-osmolality and hyponatremia: Secondary | ICD-10-CM | POA: Diagnosis present

## 2020-02-28 DIAGNOSIS — N12 Tubulo-interstitial nephritis, not specified as acute or chronic: Principal | ICD-10-CM | POA: Diagnosis present

## 2020-02-28 DIAGNOSIS — E1165 Type 2 diabetes mellitus with hyperglycemia: Secondary | ICD-10-CM | POA: Diagnosis present

## 2020-02-28 DIAGNOSIS — E785 Hyperlipidemia, unspecified: Secondary | ICD-10-CM | POA: Diagnosis present

## 2020-02-28 DIAGNOSIS — R112 Nausea with vomiting, unspecified: Secondary | ICD-10-CM | POA: Diagnosis not present

## 2020-02-28 DIAGNOSIS — R739 Hyperglycemia, unspecified: Secondary | ICD-10-CM

## 2020-02-28 DIAGNOSIS — Z87891 Personal history of nicotine dependence: Secondary | ICD-10-CM | POA: Diagnosis not present

## 2020-02-28 DIAGNOSIS — E860 Dehydration: Secondary | ICD-10-CM | POA: Diagnosis present

## 2020-02-28 DIAGNOSIS — Z79899 Other long term (current) drug therapy: Secondary | ICD-10-CM

## 2020-02-28 DIAGNOSIS — E876 Hypokalemia: Secondary | ICD-10-CM | POA: Diagnosis present

## 2020-02-28 DIAGNOSIS — Z79890 Hormone replacement therapy (postmenopausal): Secondary | ICD-10-CM | POA: Diagnosis not present

## 2020-02-28 DIAGNOSIS — I10 Essential (primary) hypertension: Secondary | ICD-10-CM | POA: Diagnosis present

## 2020-02-28 DIAGNOSIS — A419 Sepsis, unspecified organism: Secondary | ICD-10-CM

## 2020-02-28 HISTORY — DX: COVID-19: U07.1

## 2020-02-28 LAB — POC SARS CORONAVIRUS 2 AG -  ED: SARS Coronavirus 2 Ag: NEGATIVE

## 2020-02-28 LAB — CBC
HCT: 42.2 % (ref 36.0–46.0)
Hemoglobin: 13.4 g/dL (ref 12.0–15.0)
MCH: 25.2 pg — ABNORMAL LOW (ref 26.0–34.0)
MCHC: 31.8 g/dL (ref 30.0–36.0)
MCV: 79.5 fL — ABNORMAL LOW (ref 80.0–100.0)
Platelets: 363 10*3/uL (ref 150–400)
RBC: 5.31 MIL/uL — ABNORMAL HIGH (ref 3.87–5.11)
RDW: 13.9 % (ref 11.5–15.5)
WBC: 9.7 10*3/uL (ref 4.0–10.5)
nRBC: 0 % (ref 0.0–0.2)

## 2020-02-28 LAB — CBG MONITORING, ED: Glucose-Capillary: 360 mg/dL — ABNORMAL HIGH (ref 70–99)

## 2020-02-28 LAB — BLOOD GAS, ARTERIAL
Acid-Base Excess: 0.4 mmol/L (ref 0.0–2.0)
Bicarbonate: 24.1 mmol/L (ref 20.0–28.0)
FIO2: 21
O2 Saturation: 92.6 %
Patient temperature: 98.6
pCO2 arterial: 37.6 mmHg (ref 32.0–48.0)
pH, Arterial: 7.424 (ref 7.350–7.450)
pO2, Arterial: 62.9 mmHg — ABNORMAL LOW (ref 83.0–108.0)

## 2020-02-28 LAB — BASIC METABOLIC PANEL
Anion gap: 13 (ref 5–15)
BUN: 9 mg/dL (ref 6–20)
CO2: 26 mmol/L (ref 22–32)
Calcium: 8.7 mg/dL — ABNORMAL LOW (ref 8.9–10.3)
Chloride: 95 mmol/L — ABNORMAL LOW (ref 98–111)
Creatinine, Ser: 0.78 mg/dL (ref 0.44–1.00)
GFR calc Af Amer: >60 mL/min (ref >60)
GFR calc non Af Amer: >60 mL/min (ref >60)
Glucose, Bld: 284 mg/dL — ABNORMAL HIGH (ref 70–99)
Potassium: 3.6 mmol/L (ref 3.5–5.1)
Sodium: 134 mmol/L — ABNORMAL LOW (ref 135–145)

## 2020-02-28 LAB — URINALYSIS, ROUTINE W REFLEX MICROSCOPIC
Bacteria, UA: NONE SEEN
Bilirubin Urine: NEGATIVE
Glucose, UA: >=500 mg/dL — AB
Ketones, ur: 20 mg/dL — AB
Leukocytes,Ua: NEGATIVE
Nitrite: NEGATIVE
Protein, ur: 100 mg/dL — AB
Specific Gravity, Urine: 1.016 (ref 1.005–1.030)
pH: 6 (ref 5.0–8.0)

## 2020-02-28 LAB — COMPREHENSIVE METABOLIC PANEL
ALT: 10 U/L (ref 0–44)
AST: 16 U/L (ref 15–41)
Albumin: 4.1 g/dL (ref 3.5–5.0)
Alkaline Phosphatase: 171 U/L — ABNORMAL HIGH (ref 38–126)
Anion gap: 17 — ABNORMAL HIGH (ref 5–15)
BUN: 12 mg/dL (ref 6–20)
CO2: 25 mmol/L (ref 22–32)
Calcium: 9.5 mg/dL (ref 8.9–10.3)
Chloride: 88 mmol/L — ABNORMAL LOW (ref 98–111)
Creatinine, Ser: 0.9 mg/dL (ref 0.44–1.00)
GFR calc Af Amer: >60 mL/min (ref >60)
GFR calc non Af Amer: >60 mL/min (ref >60)
Glucose, Bld: 491 mg/dL — ABNORMAL HIGH (ref 70–99)
Potassium: 4.2 mmol/L (ref 3.5–5.1)
Sodium: 130 mmol/L — ABNORMAL LOW (ref 135–145)
Total Bilirubin: 1 mg/dL (ref 0.3–1.2)
Total Protein: 9.1 g/dL — ABNORMAL HIGH (ref 6.5–8.1)

## 2020-02-28 LAB — LACTIC ACID, PLASMA
Lactic Acid, Venous: 1.5 mmol/L (ref 0.5–1.9)
Lactic Acid, Venous: 0.8 mmol/L (ref 0.5–1.9)

## 2020-02-28 LAB — GLUCOSE, CAPILLARY: Glucose-Capillary: 229 mg/dL — ABNORMAL HIGH (ref 70–99)

## 2020-02-28 LAB — PREGNANCY, URINE: Preg Test, Ur: NEGATIVE

## 2020-02-28 LAB — BETA-HYDROXYBUTYRIC ACID: Beta-Hydroxybutyric Acid: 2.23 mmol/L — ABNORMAL HIGH (ref 0.05–0.27)

## 2020-02-28 LAB — PROCALCITONIN: Procalcitonin: 2.63 ng/mL

## 2020-02-28 LAB — LIPASE, BLOOD: Lipase: 23 U/L (ref 11–51)

## 2020-02-28 MED ORDER — SODIUM CHLORIDE 0.9 % IV SOLN
2.0000 g | INTRAVENOUS | Status: DC
  Administered 2020-02-29 – 2020-03-02 (×3): 2 g via INTRAVENOUS
  Filled 2020-02-28 (×3): qty 2

## 2020-02-28 MED ORDER — ONDANSETRON HCL 4 MG PO TABS: 4.0000 mg | ORAL_TABLET | Freq: Four times a day (QID) | ORAL | Status: DC | PRN

## 2020-02-28 MED ORDER — PROMETHAZINE HCL 25 MG/ML IJ SOLN
12.5000 mg | Freq: Once | INTRAMUSCULAR | Status: AC
  Administered 2020-02-28: 13:00:00 12.5 mg via INTRAVENOUS
  Filled 2020-02-28: qty 1

## 2020-02-28 MED ORDER — METOCLOPRAMIDE HCL 5 MG/ML IJ SOLN
5.0000 mg | Freq: Four times a day (QID) | INTRAMUSCULAR | Status: AC
  Administered 2020-02-28 – 2020-02-29 (×4): 5 mg via INTRAVENOUS
  Filled 2020-02-28 (×4): qty 2

## 2020-02-28 MED ORDER — IOHEXOL 300 MG/ML  SOLN
100.0000 mL | Freq: Once | INTRAMUSCULAR | Status: AC | PRN
  Administered 2020-02-28: 15:00:00 100 mL via INTRAVENOUS

## 2020-02-28 MED ORDER — INSULIN ASPART 100 UNIT/ML ~~LOC~~ SOLN
8.0000 [IU] | Freq: Once | SUBCUTANEOUS | Status: AC
  Administered 2020-02-28: 13:00:00 8 [IU] via SUBCUTANEOUS
  Filled 2020-02-28: qty 0.08

## 2020-02-28 MED ORDER — FAMOTIDINE IN NACL 20-0.9 MG/50ML-% IV SOLN
20.0000 mg | Freq: Once | INTRAVENOUS | Status: AC
  Administered 2020-02-28: 20 mg via INTRAVENOUS
  Filled 2020-02-28: qty 50

## 2020-02-28 MED ORDER — INSULIN GLARGINE 100 UNIT/ML ~~LOC~~ SOLN
9.0000 [IU] | Freq: Every day | SUBCUTANEOUS | Status: DC
  Administered 2020-02-28: 9 [IU] via SUBCUTANEOUS
  Filled 2020-02-28: qty 0.09

## 2020-02-28 MED ORDER — IBUPROFEN 200 MG PO TABS: 400.0000 mg | ORAL_TABLET | Freq: Four times a day (QID) | ORAL | Status: DC | PRN

## 2020-02-28 MED ORDER — LEVOTHYROXINE SODIUM 75 MCG PO TABS
75.0000 ug | ORAL_TABLET | Freq: Every day | ORAL | Status: DC
  Administered 2020-02-29 – 2020-03-02 (×3): 75 ug via ORAL
  Filled 2020-02-28 (×3): qty 1

## 2020-02-28 MED ORDER — INSULIN ASPART 100 UNIT/ML ~~LOC~~ SOLN
3.0000 [IU] | Freq: Three times a day (TID) | SUBCUTANEOUS | Status: DC
  Administered 2020-02-29 – 2020-03-02 (×8): 3 [IU] via SUBCUTANEOUS

## 2020-02-28 MED ORDER — SODIUM CHLORIDE 0.9 % IV BOLUS
500.0000 mL | Freq: Once | INTRAVENOUS | Status: AC
  Administered 2020-02-28: 500 mL via INTRAVENOUS

## 2020-02-28 MED ORDER — INSULIN ASPART 100 UNIT/ML ~~LOC~~ SOLN
0.0000 [IU] | Freq: Every day | SUBCUTANEOUS | Status: DC
  Administered 2020-02-28 – 2020-03-01 (×2): 2 [IU] via SUBCUTANEOUS

## 2020-02-28 MED ORDER — SODIUM CHLORIDE (PF) 0.9 % IJ SOLN
INTRAMUSCULAR | Status: AC
  Filled 2020-02-28: qty 50

## 2020-02-28 MED ORDER — METOPROLOL SUCCINATE ER 50 MG PO TB24
50.0000 mg | ORAL_TABLET | Freq: Two times a day (BID) | ORAL | Status: DC
  Administered 2020-02-28 – 2020-02-29 (×2): 50 mg via ORAL
  Filled 2020-02-28 (×2): qty 1

## 2020-02-28 MED ORDER — ONDANSETRON HCL 4 MG/2ML IJ SOLN
4.0000 mg | Freq: Once | INTRAMUSCULAR | Status: AC
  Administered 2020-02-28: 4 mg via INTRAVENOUS
  Filled 2020-02-28: qty 2

## 2020-02-28 MED ORDER — SODIUM CHLORIDE 0.9 % IV SOLN
2.0000 g | Freq: Once | INTRAVENOUS | Status: AC
  Administered 2020-02-28: 17:00:00 2 g via INTRAVENOUS
  Filled 2020-02-28: qty 20

## 2020-02-28 MED ORDER — FENTANYL CITRATE (PF) 100 MCG/2ML IJ SOLN
50.0000 ug | Freq: Once | INTRAMUSCULAR | Status: AC
  Administered 2020-02-28: 50 ug via INTRAVENOUS
  Filled 2020-02-28: qty 2

## 2020-02-28 MED ORDER — FLUTICASONE PROPIONATE 50 MCG/ACT NA SUSP
2.0000 | Freq: Every day | NASAL | Status: DC
  Administered 2020-02-29 – 2020-03-02 (×3): 2 via NASAL
  Filled 2020-02-28: qty 16

## 2020-02-28 MED ORDER — IBUPROFEN 200 MG PO TABS
400.0000 mg | ORAL_TABLET | Freq: Four times a day (QID) | ORAL | Status: DC | PRN
  Administered 2020-02-29 (×2): 400 mg via ORAL
  Filled 2020-02-28 (×3): qty 2

## 2020-02-28 MED ORDER — SODIUM CHLORIDE 0.9 % IV BOLUS
500.0000 mL | Freq: Once | INTRAVENOUS | Status: AC
  Administered 2020-02-28: 15:00:00 500 mL via INTRAVENOUS

## 2020-02-28 MED ORDER — ONDANSETRON HCL 4 MG/2ML IJ SOLN: 4.0000 mg | Freq: Four times a day (QID) | INTRAMUSCULAR | Status: DC | PRN

## 2020-02-28 MED ORDER — LORATADINE 10 MG PO TABS
10.0000 mg | ORAL_TABLET | Freq: Every day | ORAL | Status: DC
  Administered 2020-02-29 – 2020-03-02 (×3): 10 mg via ORAL
  Filled 2020-02-28 (×3): qty 1

## 2020-02-28 MED ORDER — INSULIN ASPART 100 UNIT/ML ~~LOC~~ SOLN
4.0000 [IU] | Freq: Once | SUBCUTANEOUS | Status: AC
  Administered 2020-02-28: 18:00:00 4 [IU] via INTRAVENOUS
  Filled 2020-02-28: qty 0.04

## 2020-02-28 MED ORDER — ENOXAPARIN SODIUM 40 MG/0.4ML ~~LOC~~ SOLN
40.0000 mg | SUBCUTANEOUS | Status: DC
  Administered 2020-02-28 – 2020-03-01 (×3): 40 mg via SUBCUTANEOUS
  Filled 2020-02-28 (×3): qty 0.4

## 2020-02-28 MED ORDER — SODIUM CHLORIDE 0.9% FLUSH
3.0000 mL | Freq: Once | INTRAVENOUS | Status: AC
  Administered 2020-02-28: 3 mL via INTRAVENOUS

## 2020-02-28 MED ORDER — ACETAMINOPHEN 325 MG PO TABS
650.0000 mg | ORAL_TABLET | Freq: Four times a day (QID) | ORAL | Status: DC | PRN
  Administered 2020-02-28: 650 mg via ORAL
  Filled 2020-02-28: qty 2

## 2020-02-28 MED ORDER — METOCLOPRAMIDE HCL 5 MG/ML IJ SOLN
10.0000 mg | Freq: Once | INTRAMUSCULAR | Status: AC
  Administered 2020-02-28: 10 mg via INTRAVENOUS
  Filled 2020-02-28: qty 2

## 2020-02-28 MED ORDER — INSULIN ASPART 100 UNIT/ML ~~LOC~~ SOLN
0.0000 [IU] | Freq: Three times a day (TID) | SUBCUTANEOUS | Status: DC
  Administered 2020-02-29: 5 [IU] via SUBCUTANEOUS
  Administered 2020-02-29 (×2): 11 [IU] via SUBCUTANEOUS
  Administered 2020-03-01: 3 [IU] via SUBCUTANEOUS
  Administered 2020-03-01: 17:00:00 2 [IU] via SUBCUTANEOUS
  Administered 2020-03-02: 5 [IU] via SUBCUTANEOUS
  Administered 2020-03-02: 08:00:00 3 [IU] via SUBCUTANEOUS

## 2020-02-28 MED ORDER — ACETAMINOPHEN 650 MG RE SUPP: 650.0000 mg | Freq: Four times a day (QID) | RECTAL | Status: DC | PRN

## 2020-02-28 MED ORDER — PANTOPRAZOLE SODIUM 40 MG PO TBEC
40.0000 mg | DELAYED_RELEASE_TABLET | Freq: Every day | ORAL | Status: DC
  Administered 2020-02-29 – 2020-03-02 (×3): 40 mg via ORAL
  Filled 2020-02-28 (×3): qty 1

## 2020-02-28 MED ORDER — SODIUM CHLORIDE 0.9 % IV BOLUS
1000.0000 mL | Freq: Once | INTRAVENOUS | Status: AC
  Administered 2020-02-28: 12:00:00 1000 mL via INTRAVENOUS

## 2020-02-28 MED ORDER — SODIUM CHLORIDE 0.9 % IV SOLN: INTRAVENOUS | Status: DC

## 2020-02-28 NOTE — ED Notes (Signed)
PO fluids given as ordered.

## 2020-02-28 NOTE — ED Provider Notes (Signed)
[facility]-EMERGENCY DEPT Provider Note   CSN: [id number] Arrival date & time: 02/28/20  1131    History Chief Complaint  Patient presents with  . Emesis  . Fever    Julia [name] is a 49 y.o. female with a history of T2DM, hypertension, hyperlipidemia, subclinical hypothyroidism, & c section who presents to the ED with complaints of fever that began last night & emesis that began today. Patient states that she has generally felt fatigued since having COVID around 01/24/20, however yesterday this seemed to increase with fever & chills- temp max 100.0 as well as some poor appetite. This morning she woke up nauseated and had had TNTC episodes of non bloody emesis. States she has some RUQ abdominal pain with vomiting, otherwise not having much pain. Last BM was yesterday and fairly typical for her. Denies URI sxs, cough, dyspnea, chest pain, melena, hematochezia, or dysuria. No recent suspicious PO intake.   HPI     Past Medical History:  Diagnosis Date  . Complication of anesthesia    I had a reaction to a epidural "broke out"  . Condyloma acuminatum of vulva   . COVID-19   . GERD (gastroesophageal reflux disease)   . History of supraventricular tachycardia   . HSV-1 infection   . HSV-2 infection   . HTN (hypertension)   . Hyperlipidemia   . Type 2 diabetes mellitus [facility])     Patient Active Problem List   Diagnosis Date Noted  . Nasal sinus congestion 03/15/2018  . Subclinical hypothyroidism 08/05/2017  . Hyperlipidemia 07/28/2016  . Type 2 diabetes mellitus with hyperglycemia, without long-term current use of insulin (HCC) 04/15/2016  . Sinus tachycardia 11/12/2015  . S/P radiofrequency ablation operation for arrhythmia 02/24/2015  . SVT (supraventricular tachycardia) (HCC) 08/13/2014  . Palpitations 08/13/2014  . Obesity, Class I, BMI 30-34.9 09/30/2013  . HTN (hypertension) 08/01/2012    Past Surgical History:  Procedure Laterality Date  .  CARDIOVASCULAR STRESS TEST  06-04-2007   normal nuclear study/  no ischemia/  normal LVF, ef 63%  . CESAREAN SECTION  09-06-2005  . LASER ABLATION CONDOLAMATA N/A 10/08/2015   Procedure: CO2 LASER ABLATION CONDOLAMATA OF VULVA;  Surgeon: Richarda Overlie, MD;  Location: [facility];  Service: Gynecology;  Laterality: N/A;  . SUPRAVENTRICULAR TACHYCARDIA ABLATION N/A 02/24/2015   Procedure: SUPRAVENTRICULAR TACHYCARDIA ABLATION;  Surgeon: Marinus Maw, MD;  Location: [facility] CATH LAB;  Service: Cardiovascular;  Laterality: N/A;  . TRANSTHORACIC ECHOCARDIOGRAM  01-06-2009   normal LVF, ef 55-60%,  trace MR and TR/  normal stress echo     OB History   No obstetric history on file.     Family History  Problem Relation Age of Onset  . Hypertension Mother   . Thyroid disease Mother   . Diabetes Mother   . Thyroid disease Sister   . Diabetes Maternal Grandmother   . Hyperlipidemia Other   . Thyroid disease Other     Social History   Tobacco Use  . Smoking status: Former Smoker    Years: 8.00    Types: Cigarettes    Quit date: 12/16/2002    Years since quitting: 17.2  . Smokeless tobacco: Never Used  Substance Use Topics  . Alcohol use: Yes    Alcohol/week: 2.0 standard drinks    Types: 2 Standard drinks or equivalent per week    Comment: rare  . Drug use: No    Home Medications Prior to Admission medications  Medication Sig Start Date End Date Taking? Authorizing Provider  cephALEXin (KEFLEX) 500 MG capsule Take 1 capsule (500 mg total) by mouth 4 (four) times daily. 02/16/20   Dahlia Byes A, NP  Clobetasol Propionate 0.05 % shampoo  11/29/18   [provider]  Dulaglutide (TRULICITY) 0.75 MG/0.5ML SOPN Inject 0.75 mg into the skin once a week. 03/15/18   Sherren Mocha, MD  fluticasone (FLONASE) 50 MCG/ACT nasal spray Place 2 sprays into both nostrils daily. 03/12/15   [provider]  furosemide (LASIX) 40 MG tablet Take 1 tablet (40 mg total) by  mouth daily. 04/25/19 04/24/20  Tereso Newcomer T, PA-C  glucose blood (ONE TOUCH ULTRA TEST) test strip USE AS DIRECTED 10/04/17   Carlus Pavlov, MD  levothyroxine (SYNTHROID, LEVOTHROID) 75 MCG tablet TAKE 1 TABLET BY MOUTH EVERY DAY 11/01/18   Sherren Mocha, MD  loratadine (CLARITIN) 10 MG tablet Take 10 mg by mouth daily.    [provider]  magic mouthwash SOLN Take 15 mLs by mouth every 4 (four) hours as needed for mouth pain. Hydrocortisone 60 mg, nystatin 100,000u/ml 30 ml suspension, Benadryl 12.5 mg/ml QS to 240 ml 02/16/20   Bast, Traci A, NP  metFORMIN (GLUCOPHAGE) 1000 MG tablet Take 1 tablet (1,000 mg total) by mouth 2 (two) times daily with a meal. 02/20/19   Corum, Minerva Fester, MD  metoprolol succinate (TOPROL-XL) 50 MG 24 hr tablet Take 1 tablet (50 mg total) by mouth 2 (two) times daily. With or immediately following a meal. 12/24/19   Nahser, Deloris Ping, MD  ondansetron (ZOFRAN ODT) 4 MG disintegrating tablet Take 1 tablet (4 mg total) by mouth every 8 (eight) hours as needed for nausea or vomiting. 01/22/20   Gailen Shelter, PA  phenazopyridine (PYRIDIUM) 200 MG tablet Take 1 tablet (200 mg total) by mouth 3 (three) times daily. 01/22/20   Gailen Shelter, PA  potassium chloride SA (K-DUR,KLOR-CON) 20 MEQ tablet Take 1 tablet (20 mEq total) by mouth daily. 03/15/18   Sherren Mocha, MD  propranolol (INDERAL) 10 MG tablet Take 10 mg by mouth 4 (four) times daily as needed (heart papatations).     [provider]    Allergies    Sulfa antibiotics, Sulfur, Trulicity [dulaglutide], Hctz [hydrochlorothiazide], and Invokana [canagliflozin]  Review of Systems   Review of Systems  Constitutional: Positive for appetite change, chills, fatigue and fever.  Respiratory: Negative for cough and shortness of breath.   Cardiovascular: Negative for chest pain.  Gastrointestinal: Positive for abdominal pain (w/ emesis), nausea and vomiting. Negative for anal bleeding, blood in stool,  constipation and diarrhea.  Genitourinary: Negative for dysuria, vaginal bleeding and vaginal discharge.  Neurological: Negative for syncope.  All other systems reviewed and are negative.   Physical Exam Updated Vital Signs BP (!) 147/99 (BP Location: Left Arm)   Pulse (!) 107   Temp 99.2 F (37.3 C) (Oral)   Resp 18   Ht 5\' 4"  (1.626 m)   Wt 74.8 kg   LMP 07/22/2019   SpO2 99%   BMI 28.32 kg/m   Physical Exam Vitals and nursing note reviewed.  Constitutional:      Appearance: She is well-developed. She is not toxic-appearing.     Comments: Actively vomiting throughout initial assessment.  HENT:     Head: Normocephalic and atraumatic.  Eyes:     General:        Right eye: No discharge.  Left eye: No discharge.     Conjunctiva/sclera: Conjunctivae normal.  Cardiovascular:     Rate and Rhythm: Regular rhythm. Tachycardia present.  Pulmonary:     Effort: Pulmonary effort is normal. No respiratory distress.     Breath sounds: Normal breath sounds. No wheezing, rhonchi or rales.  Abdominal:     General: There is no distension.     Palpations: Abdomen is soft.     Tenderness: There is abdominal tenderness (RUQ & some to epigastrium). There is no guarding or rebound.  Musculoskeletal:     Cervical back: Neck supple.  Skin:    General: Skin is warm and dry.     Findings: No rash.  Neurological:     Mental Status: She is alert.     Comments: Clear speech.   Psychiatric:        Behavior: Behavior normal.    ED Results / Procedures / Treatments   Labs (all labs ordered are listed, but only abnormal results are displayed) Labs Reviewed  COMPREHENSIVE METABOLIC PANEL - Abnormal; Notable for the following components:      Result Value   Sodium 130 (*)    Chloride 88 (*)    Glucose, Bld 491 (*)    Total Protein 9.1 (*)    Alkaline Phosphatase 171 (*)    Anion gap 17 (*)    All other components within normal limits  CBC - Abnormal; Notable for the following  components:   RBC 5.31 (*)    MCV 79.5 (*)    MCH 25.2 (*)    All other components within normal limits  URINALYSIS, ROUTINE W REFLEX MICROSCOPIC - Abnormal; Notable for the following components:   Color, Urine STRAW (*)    Glucose, UA >=500 (*)    Hgb urine dipstick SMALL (*)    Ketones, ur 20 (*)    Protein, ur 100 (*)    All other components within normal limits  LIPASE, BLOOD  PREGNANCY, URINE    EKG EKG Interpretation  Date/Time:  Saturday February 28 2020 14:40:56 EDT Ventricular Rate:  105 PR Interval:    QRS Duration: 92 QT Interval:  332 QTC Calculation: 439 R Axis:   48 Text Interpretation: Sinus tachycardia Low voltage, precordial leads Borderline T abnormalities, inferior leads Baseline wander in lead(s) V1 No significant change since last tracing Confirmed by Linwood Dibbles [phone]) on 02/28/2020 2:43:29 PM   Radiology US Abdomen Limited RUQ  Result Date: 02/28/2020 CLINICAL DATA:  Vomiting for 6 weeks. EXAM: ULTRASOUND ABDOMEN LIMITED RIGHT UPPER QUADRANT COMPARISON:  None. FINDINGS: Gallbladder: There is a small amount of sludge in the gallbladder. There is a comet tail sign in the fundus which could represent minimal adenomyomatosis. No stones. Gallbladder wall thickness is normal. Common bile duct: Diameter: 4 mm, normal. Liver: Diffuse hepatic steatosis with focal fatty sparing adjacent to the gallbladder. No liver masses. Portal vein is patent on color Doppler imaging with normal direction of blood flow towards the liver. Other: None. IMPRESSION: 1. No acute abnormality. 2. Hepatic steatosis. Electronically Signed   By: Francene Boyers M.D.   On: 02/28/2020 13:54    Procedures Procedures (including critical care time)  Medications Ordered in ED Medications  sodium chloride flush (NS) 0.9 % injection 3 mL (3 mLs Intravenous Given 02/28/20 1218)  ondansetron (ZOFRAN) injection 4 mg (4 mg Intravenous Given 02/28/20 1218)  sodium chloride 0.9 % bolus 1,000 mL (0 mLs  Intravenous Stopped 02/28/20 1323)  famotidine (PEPCID) IVPB 20 mg  premix (0 mg Intravenous Stopped 02/28/20 1252)  insulin aspart (novoLOG) injection 8 Units (8 Units Subcutaneous Given 02/28/20 1252)  promethazine (PHENERGAN) injection 12.5 mg (12.5 mg Intravenous Given 02/28/20 1323)    ED Course  I have reviewed the triage vital signs and the nursing notes.  Pertinent labs & imaging results that were available during my care of the patient were reviewed by me and considered in my medical decision making (see chart for details).    MDM Rules/Calculators/A&P                      Patient presents to the ED with complaints of fever/chills since yesterday with N/V this AM. Patient is nontoxic, she is actively vomiting on initial eval, vitals with mild tachycardia & hypertension, I personally repeated oral temp which was 98.6. Abdomen with some RUQ & epigastric tenderness, no peritoneal signs. Plan for fluids, pepcid, & zofran, offered analgesics but patient declined. Labs, RUQ [place].   CBC: No leukocytosis, no anemia.  CMP: Hyperglycemia @ 491 w/ elevated anion gap @ 17, no acidosis with normal bicarb--> fluids running, will give 8 units SQ insulin for treatment. Mild hyponatremia/choleremia- NS being given. T protein mildly elevated- suspect degree of dehydration. LFTs & renal function WNL.  Lipase: WNL Pregnancy test: negative UA: Ketones, glucose, small hgb, not consistent w/ UTI especially in setting of no urinary sxs.  Given COVID within last 90 days will not test for this.  13:00: RE-EVAL: Patient remains with continued emesis- phenergan ordered.   RUQ [place]: 1. No acute abnormality. 2. Hepatic steatosis.  14:10: RE-EVAL: Patient with continued emesis, repeat abdominal exam remains with some RUQ & epigastric tenderness w/o peritoneal signs. Given persistent vomiting will obtain CT imaging per discussion w/ Dr. Tomi Bamberger. Given multiple anti-emetics being required will obtain EKG to check QTc.    14:43: QTc 439- will give reglan. Patient also now would like something for pain 50 mcg of fentanyl ordered as well.   15:15: Patient care signed out to Providence Lanius PA-C at change of shift pending CT imaging & disposition. If no acute process on imaging and tolerating PO feel patient can be discharged home.   Findings and plan of care discussed with supervising physician Dr. Tomi Bamberger who is in agreement.   Final Clinical Impression(s) / ED Diagnoses Final diagnoses:  Vomiting  Hyperglycemia    Rx / DC Orders ED Discharge Orders    None       Amaryllis Dyke, PA-C 02/28/20 1523    Dorie Rank, MD 02/29/20 1531

## 2020-02-28 NOTE — ED Provider Notes (Signed)
Care assumed from [facility], PA-C at shift change with CT abd/pelvis pending.   In brief, this patient is a 49 y.o. F past medical history of type 2 diabetes who presents for evaluation of subjective fever/chills, poor appetite that began yesterday as well as vomiting that began yesterday.  Patient reports she had some mild pain with only vomiting but then she had some slightly worse pain here in the ED.  Please see note from previous provider for full history/physical exam.  Physical Exam  BP (!) 149/102   Pulse 100   Temp 99.2 F (37.3 C) (Oral)   Resp 18   Ht 5\' 4"  (1.626 m)   Wt 74.8 kg   LMP 07/22/2019   SpO2 98%   BMI 28.32 kg/m   Physical Exam  ED Course/Procedures     Procedures  Results for orders placed or performed during the hospital encounter of 02/28/20 (from the past 24 hour(s))  Lipase, blood     Status: None   Collection Time: 02/28/20 11:40 AM  Result Value Ref Range   Lipase 23 11 - 51 U/L  Comprehensive metabolic panel     Status: Abnormal   Collection Time: 02/28/20 11:40 AM  Result Value Ref Range   Sodium 130 (L) 135 - 145 mmol/L   Potassium 4.2 3.5 - 5.1 mmol/L   Chloride 88 (L) 98 - 111 mmol/L   CO2 25 22 - 32 mmol/L   Glucose, Bld 491 (H) 70 - 99 mg/dL   BUN 12 6 - 20 mg/dL   Creatinine, Ser 0.90 0.44 - 1.00 mg/dL   Calcium 9.5 8.9 - 10.3 mg/dL   Total Protein 9.1 (H) 6.5 - 8.1 g/dL   Albumin 4.1 3.5 - 5.0 g/dL   AST 16 15 - 41 U/L   ALT 10 0 - 44 U/L   Alkaline Phosphatase 171 (H) 38 - 126 U/L   Total Bilirubin 1.0 0.3 - 1.2 mg/dL   GFR calc non Af Amer >60 >60 mL/min   GFR calc Af Amer >60 >60 mL/min   Anion gap 17 (H) 5 - 15  CBC     Status: Abnormal   Collection Time: 02/28/20 11:40 AM  Result Value Ref Range   WBC 9.7 4.0 - 10.5 K/uL   RBC 5.31 (H) 3.87 - 5.11 MIL/uL   Hemoglobin 13.4 12.0 - 15.0 g/dL   HCT 42.2 36.0 - 46.0 %   MCV 79.5 (L) 80.0 - 100.0 fL   MCH 25.2 (L) 26.0 - 34.0 pg   MCHC 31.8 30.0 - 36.0 g/dL   RDW  13.9 11.5 - 15.5 %   Platelets 363 150 - 400 K/uL   nRBC 0.0 0.0 - 0.2 %  Pregnancy, urine     Status: None   Collection Time: 02/28/20 11:40 AM  Result Value Ref Range   Preg Test, Ur NEGATIVE NEGATIVE  Urinalysis, Routine w reflex microscopic     Status: Abnormal   Collection Time: 02/28/20 12:19 PM  Result Value Ref Range   Color, Urine STRAW (A) YELLOW   APPearance CLEAR CLEAR   Specific Gravity, Urine 1.016 1.005 - 1.030   pH 6.0 5.0 - 8.0   Glucose, UA >=500 (A) NEGATIVE mg/dL   Hgb urine dipstick SMALL (A) NEGATIVE   Bilirubin Urine NEGATIVE NEGATIVE   Ketones, ur 20 (A) NEGATIVE mg/dL   Protein, ur 100 (A) NEGATIVE mg/dL   Nitrite NEGATIVE NEGATIVE   Leukocytes,Ua NEGATIVE NEGATIVE   RBC / HPF  0-5 0 - 5 RBC/hpf   WBC, UA 11-20 0 - 5 WBC/hpf   Bacteria, UA NONE SEEN NONE SEEN   Squamous Epithelial / LPF 0-5 0 - 5  POC CBG, ED     Status: Abnormal   Collection Time: 02/28/20  4:28 PM  Result Value Ref Range   Glucose-Capillary 360 (H) 70 - 99 mg/dL   Comment 1 Notify RN   Blood gas, arterial     Status: Abnormal   Collection Time: 02/28/20  5:36 PM  Result Value Ref Range   FIO2 21.00    pH, Arterial 7.424 7.350 - 7.450   pCO2 arterial 37.6 32.0 - 48.0 mmHg   pO2, Arterial 62.9 (L) 83.0 - 108.0 mmHg   Bicarbonate 24.1 20.0 - 28.0 mmol/L   Acid-Base Excess 0.4 0.0 - 2.0 mmol/L   O2 Saturation 92.6 %   Patient temperature 98.6    Collection site LEFT RADIAL    Allens test (pass/fail) PASS PASS     MDM   PLAN: Patient is pending CT abdomen pelvis and p.o. challenge.  MDM:  UA shows small hemoglobin.  She has a small mount of ketones.  Urine pregnancy negative.  Lipase is unremarkable.  CMP shows bicarb 25, glucose of 491.  BUN and creatinine with normal limits.  Anion gap is 17.  At this time, work-up is not consistent with DKA.  CBC shows no leukocytosis.  Ultrasound shows no evidence of acute abnormalities.  CT scan shows bilateral multifocal pyelonephritis  without evidence of renal abscess.  Plan for Rocephin here in the ED.  Discussed results with patient.  She does report feeling better.  She has not any more vomiting.  She still slightly tachycardic.  We will plan to recheck her glucose, p.o. challenge.   Repeat CBG shows it is 360.  Patient able to tolerate p.o.  I discussed with Dr. Alvester Morin regarding findings on CT scan.  He recommends admission for IV antibiotics (Ceftriaxone) and control of her glucose.  He recommends medical admission.  Will consult as needed.  Updated patient on plan. She is agreeable.   Discussed with hospitalist who accepts patient for admission.   1. Hyperglycemia   2. Vomiting   3. Pyelonephritis     Portions of this note were generated with Dragon dictation software. Dictation errors may occur despite best attempts at proofreading.    Maxwell Caul, PA-C 02/28/20 1805    Donnetta Hutching, MD 02/28/20 Ernestina [place]

## 2020-02-28 NOTE — ED Triage Notes (Signed)
Yesterday developed a fever 100.3 orally, today vomiting. This morning fever 99.9. In triage 99.2

## 2020-02-28 NOTE — Progress Notes (Signed)
   02/28/20 1951  MEWS Score  Temp (!) 101.7 F (38.7 C)  BP (!) 143/89  Pulse Rate (!) 115  Resp 20  SpO2 98 %  MEWS Score  MEWS Temp 2  MEWS Systolic 0  MEWS Pulse 2  MEWS RR 0  MEWS LOC 0  MEWS Score 4  MEWS Score Color Red  MEWS Assessment  Is this an acute change? Yes  MEWS guidelines implemented *See Row Information* Red  Rapid Response Notification  Name of Rapid Response RN Notified NA   Provider Notification  Provider Name/Title Evonnie Dawes NP   Date Provider Notified 02/28/20  Time Provider Notified 2000  Notification Type Page  Notification Reason Change in status  Response See new orders (LA ordered)  Date of Provider Response 02/28/20  Time of Provider Response 2011  Pt arrived to floor from ED, AO4, MEWS RED due to HR 115s, and temp 101.7. Tylenol will be given and VS will be monitored.

## 2020-02-28 NOTE — H&P (Signed)
History and Physical        Hospital Admission Note Date: 02/28/2020  Patient name: Julia [name] Medical record number: [id number] Date of birth: June 18, 1971 Age: 49 y.o. Gender: female  PCP: Sherren Mocha, MD    Patient coming from: Home  I have reviewed all records in the [facility]    Chief Complaint:  Fever chills with nausea and vomiting since yesterday  HPI: Patient is a 49 year old female with history of diabetes mellitus type 2, NIDDM, hypertension, hyperlipidemia, hypothyroidism, had COVID-19 in February, received  antibody infusion, also had UTI around the same time.  Patient reported that she developed fevers yesterday 100.3 F with chills, today started having nausea and vomiting and epigastric abdominal pain.  Patient reports that since the last month she has not been taking her Trulicity however has been taking her Metformin daily. At the time of my examination, no active vomiting, epigastric abdominal pain 1/10, just feels a sore, had mild right upper quadrant abdominal pain currently improving.  Last BM yesterday.  No hematemesis hematochezia or melena.  States that she has not been eating well in the last 2 days.  ED work-up/course:   In ED temp 99.2 F, pulse 107-113, BP 147/99, O2 sats 97% on room air, respiratory rate 22  CBC unremarkable, WBCs 9.7, lactic acid 1.2 Sodium 130, potassium 4.2, glucose 491, CO2 25, anion gap 17 Beta hydroxybutyrate pending UA positive for UTI and ketones ABG showed pH 7.4, PCO2 37.6, PO2 62.9, bicarb 24  Review of Systems: Positives marked in 'bold' Constitutional: + fever, chills, diaphoresis, poor appetite and fatigue.  HEENT: Denies photophobia, eye pain, redness, hearing loss, ear pain, congestion, sore throat, rhinorrhea, sneezing, mouth sores, trouble swallowing, neck pain, neck stiffness and tinnitus.     Respiratory: Denies SOB, DOE, cough, chest tightness,  and wheezing.   Cardiovascular: Denies chest pain, palpitations and leg swelling.  Gastrointestinal: Please see HPI Genitourinary: Denies dysuria, urgency, frequency, hematuria, flank pain and difficulty urinating.  Musculoskeletal: Denies myalgias, back pain, joint swelling, arthralgias and gait problem.  Skin: Denies pallor, rash and wound.  Neurological: Denies dizziness, seizures, syncope, weakness, light-headedness, numbness and headaches.  Hematological: Denies adenopathy. Easy bruising, personal or family bleeding history  Psychiatric/Behavioral: Denies suicidal ideation, mood changes, confusion, nervousness, sleep disturbance and agitation  Past Medical History: Past Medical History:  Diagnosis Date  . Complication of anesthesia    I had a reaction to a epidural "broke out"  . Condyloma acuminatum of vulva   . COVID-19   . GERD (gastroesophageal reflux disease)   . History of supraventricular tachycardia   . HSV-1 infection   . HSV-2 infection   . HTN (hypertension)   . Hyperlipidemia   . Type 2 diabetes mellitus (HCC)     Past Surgical History:  Procedure Laterality Date  . CARDIOVASCULAR STRESS TEST  06-04-2007   normal nuclear study/  no ischemia/  normal LVF, ef 63%  . CESAREAN SECTION  09-06-2005  . LASER ABLATION CONDOLAMATA N/A 10/08/2015   Procedure: CO2 LASER ABLATION CONDOLAMATA OF VULVA;  Surgeon: Richarda Overlie, MD;  Location: [facility];  Service: Gynecology;  Laterality: N/A;  .  SUPRAVENTRICULAR TACHYCARDIA ABLATION N/A 02/24/2015   Procedure: SUPRAVENTRICULAR TACHYCARDIA ABLATION;  Surgeon: Evans Lance, MD;  Location: [facility] CATH LAB;  Service: Cardiovascular;  Laterality: N/A;  . TRANSTHORACIC ECHOCARDIOGRAM  01-06-2009   normal LVF, ef 55-60%,  trace MR and TR/  normal stress echo    Medications: Prior to Admission medications   Medication Sig Start Date End Date Taking?  Authorizing Provider  cephALEXin (KEFLEX) 500 MG capsule Take 1 capsule (500 mg total) by mouth 4 (four) times daily. 02/16/20  Yes Bast, Traci A, NP  Dulaglutide (TRULICITY) 4.13 KG/4.0NU SOPN Inject 0.75 mg into the skin once a week. 03/15/18  Yes Shawnee Knapp, MD  fluticasone (FLONASE) 50 MCG/ACT nasal spray Place 2 sprays into both nostrils daily. 03/12/15  Yes [provider]  furosemide (LASIX) 40 MG tablet Take 1 tablet (40 mg total) by mouth daily. 04/25/19 04/24/20 Yes Weaver, Scott T, PA-C  levothyroxine (SYNTHROID, LEVOTHROID) 75 MCG tablet TAKE 1 TABLET BY MOUTH EVERY DAY 11/01/18  Yes Shawnee Knapp, MD  loratadine (CLARITIN) 10 MG tablet Take 10 mg by mouth daily.   Yes [provider]  magic mouthwash SOLN Take 15 mLs by mouth every 4 (four) hours as needed for mouth pain. Hydrocortisone 60 mg, nystatin 100,000u/ml 30 ml suspension, Benadryl 12.5 mg/ml QS to 240 ml 02/16/20  Yes Bast, Traci A, NP  metFORMIN (GLUCOPHAGE) 1000 MG tablet Take 1 tablet (1,000 mg total) by mouth 2 (two) times daily with a meal. 02/20/19  Yes Corum, Rex Kras, MD  metoprolol succinate (TOPROL-XL) 50 MG 24 hr tablet Take 1 tablet (50 mg total) by mouth 2 (two) times daily. With or immediately following a meal. 12/24/19  Yes Nahser, Wonda Cheng, MD  ondansetron (ZOFRAN ODT) 4 MG disintegrating tablet Take 1 tablet (4 mg total) by mouth every 8 (eight) hours as needed for nausea or vomiting. 01/22/20  Yes Fondaw, Wylder S, PA  phenazopyridine (PYRIDIUM) 200 MG tablet Take 1 tablet (200 mg total) by mouth 3 (three) times daily. 01/22/20  Yes Fondaw, Wylder S, PA  potassium chloride SA (K-DUR,KLOR-CON) 20 MEQ tablet Take 1 tablet (20 mEq total) by mouth daily. 03/15/18  Yes Shawnee Knapp, MD  propranolol (INDERAL) 10 MG tablet Take 10 mg by mouth 4 (four) times daily as needed (heart papatations).    Yes [provider]  glucose blood (ONE TOUCH ULTRA TEST) test strip USE AS DIRECTED 10/04/17   Philemon Kingdom,  MD    Allergies:   Allergies  Allergen Reactions  . Sulfa Antibiotics Hives  . Sulfur Other (See Comments)    blisters  . Trulicity [Dulaglutide]     Abdominal pain  . Hctz [Hydrochlorothiazide] Rash    Possible photosensitivity dermatitis  . Invokana [Canagliflozin] Rash    Possible photosensitivity dermatitis    Social History:  reports that she quit smoking about 17 years ago. Her smoking use included cigarettes. She quit after 8.00 years of use. She has never used smokeless tobacco. She reports current alcohol use of about 2.0 standard drinks of alcohol per week. She reports that she does not use drugs.  Family History: Family History  Problem Relation Age of Onset  . Hypertension Mother   . Thyroid disease Mother   . Diabetes Mother   . Thyroid disease Sister   . Diabetes Maternal Grandmother   . Hyperlipidemia Other   . Thyroid disease Other     Physical Exam: Blood pressure (!) 149/102, pulse 100, temperature  99.2 F (37.3 C), temperature source Oral, resp. rate 18, height 5\' 4"  (1.626 m), weight 74.8 kg, last menstrual period 07/22/2019, SpO2 98 %. General: Alert, awake, oriented x3, in no acute distress. Eyes: pink conjunctiva,anicteric sclera, pupils equal and reactive to light and accomodation, HEENT: normocephalic, atraumatic, oropharynx clear, dry mucosal membranes Neck: supple, no masses or lymphadenopathy, no goiter, no bruits, no JVD CVS: Regular rate and rhythm, without murmurs, rubs or gallops. No lower extremity edema Resp : Clear to auscultation bilaterally, no wheezing, rales or rhonchi. GI : Soft, mild epigastric tenderness on deep palpation , nondistended, positive bowel sounds, no masses. No hepatomegaly. No hernia.  Musculoskeletal: No clubbing or cyanosis, positive pedal pulses. No contracture. ROM intact  Neuro: Grossly intact, no focal neurological deficits, strength 5/5 upper and lower extremities bilaterally Psych: alert and oriented x 3,  normal mood and affect Skin: no rashes or lesions, warm and dry   LABS on Admission: I have personally reviewed all the labs and imagings below    Basic Metabolic Panel: Recent Labs  Lab 02/28/20 1140  NA 130*  K 4.2  CL 88*  CO2 25  GLUCOSE 491*  BUN 12  CREATININE 0.90  CALCIUM 9.5   Liver Function Tests: Recent Labs  Lab 02/28/20 1140  AST 16  ALT 10  ALKPHOS 171*  BILITOT 1.0  PROT 9.1*  ALBUMIN 4.1   Recent Labs  Lab 02/28/20 1140  LIPASE 23   No results for input(s): AMMONIA in the last 168 hours. CBC: Recent Labs  Lab 02/28/20 1140  WBC 9.7  HGB 13.4  HCT 42.2  MCV 79.5*  PLT 363   Cardiac Enzymes: No results for input(s): CKTOTAL, CKMB, CKMBINDEX, TROPONINI in the last 168 hours. BNP: Invalid input(s): POCBNP CBG: Recent Labs  Lab 02/28/20 1628  GLUCAP 360*    Radiological Exams on Admission:  CT Abdomen Pelvis W Contrast  Result Date: 02/28/2020 CLINICAL DATA:  Nausea and vomiting, bloody emesis, right upper quadrant pain EXAM: CT ABDOMEN AND PELVIS WITH CONTRAST TECHNIQUE: Multidetector CT imaging of the abdomen and pelvis was performed using the standard protocol following bolus administration of intravenous contrast. CONTRAST:  100mL OMNIPAQUE IOHEXOL 300 MG/ML  SOLN COMPARISON:  02/28/2020 FINDINGS: Lower chest: No acute pleural or parenchymal lung disease. Hepatobiliary: There is mild fatty infiltration of the liver. Minimal gallbladder sludge is identified without cholelithiasis or cholecystitis. No biliary dilation. Pancreas: Unremarkable. No pancreatic ductal dilatation or surrounding inflammatory changes. Spleen: There is borderline enlargement the spleen measuring 13.4 cm in craniocaudal length. No focal splenic abnormalities. Adrenals/Urinary Tract: There is markedly heterogeneous enhancement of the bilateral kidneys consistent with multifocal bilateral pyelonephritis. I do not see any evidence of renal abscess at this time. No  obstructive uropathy. No urinary tract calculi. The bladder is unremarkable. Adrenals are normal. Stomach/Bowel: No bowel obstruction or ileus. Normal appendix right lower quadrant. Minimal colonic diverticulosis without diverticulitis. No bowel wall thickening or inflammatory changes. Vascular/Lymphatic: No significant vascular findings are present. No enlarged abdominal or pelvic lymph nodes. Reproductive: Uterus and bilateral adnexa are unremarkable. Other: No abdominal wall hernia or abnormality. No abdominopelvic ascites. Musculoskeletal: No acute or destructive bony lesions. Reconstructed images demonstrate no additional findings. IMPRESSION: 1. Bilateral multifocal pyelonephritis without evidence of renal abscess. 2. Mild fatty infiltration of the liver. 3. Borderline splenomegaly. 4. Minimal colonic diverticulosis without diverticulitis. Electronically Signed   By: Sharlet SalinaMichael  Brown M.D.   On: 02/28/2020 15:33   US Abdomen Limited RUQ  Result Date:  02/28/2020 CLINICAL DATA:  Vomiting for 6 weeks. EXAM: ULTRASOUND ABDOMEN LIMITED RIGHT UPPER QUADRANT COMPARISON:  None. FINDINGS: Gallbladder: There is a small amount of sludge in the gallbladder. There is a comet tail sign in the fundus which could represent minimal adenomyomatosis. No stones. Gallbladder wall thickness is normal. Common bile duct: Diameter: 4 mm, normal. Liver: Diffuse hepatic steatosis with focal fatty sparing adjacent to the gallbladder. No liver masses. Portal vein is patent on color Doppler imaging with normal direction of blood flow towards the liver. Other: None. IMPRESSION: 1. No acute abnormality. 2. Hepatic steatosis. Electronically Signed   By: Francene Boyers M.D.   On: 02/28/2020 13:54      EKG: Independently reviewed. *Rate 105,*sinus tachycardia,    Assessment/Plan Principal Problem: SIRS with bilateral multifocal pyelonephritis -Patient presented with fevers, tachycardia, likely secondary to bilateral pyelonephritis,  UTI -CT abdomen showed bilateral multifocal pyelonephritis, no renal abscess or obstruction. -Follow blood cultures, urine culture and sensitivities, procalcitonin -Placed on IV Rocephin 2 g IV daily, IV fluid hydration -Right upper quadrant ultrasound negative for acute abnormality -EDP discussed with urology, Dr. Alvester Morin, recommended IV antibiotics, no other urological interventions needed at this time.   Active Problems: Hyperglycemia with type 2 diabetes mellitus, NIDDM, uncontrolled -Anion gap 17 however bicarb 25, on ABG 24.1, UA positive for mild ketonuria, beta hydroxybutyrate pending -Patient has received 12 units of NovoLog in ED. Repeat CBG 360, hopefully will not need insulin drip if continues to improve -As patient is not actively having any vomiting at this time and wants to eat, placed on carb modified diet, sliding scale insulin, Lantus 9 units at bedtime, NovoLog meal coverage -Repeat CBG 360, hopefully will not need insulin drip if continues to improve -Recheck bmet    HTN (hypertension) with sinus tachycardia -Restart metoprolol (sinus tachycardia likely due to #1 and possibly reflex tachycardia from beta-blocker withdrawal)    Intractable nausea and vomiting and dehydration, likely due to #1 -Repeat labs BMET, beta hydroxybutyrate -Placed on IV Reglan 5 mg every 6 hours x24 hours, IV Zofran as needed as needed  Mild epigastric pain likely secondary to gastritis, esophagitis -Placed on PPI  Hyponatremia -Likely pseudohyponatremia with hyperglycemia.  Sodium 130, corrected sodium 136 with CBG of 491 -Continue IV fluid hydration  DVT prophylaxis: Lovenox  CODE STATUS: Full code  Consults called: None  Family Communication: Admission, patients condition and plan of care including tests being ordered have been discussed with the patient  who indicates understanding and agree with the plan and Code Status  Admission status: Inpatient telemetry  The medical decision  making on this patient was of high complexity and the patient is at high risk for clinical deterioration, therefore this is a level 3 admission.  Severity of Illness:      The appropriate patient status for this patient is INPATIENT. Inpatient status is judged to be reasonable and necessary in order to provide the required intensity of service to ensure the patient's safety. The patient's presenting symptoms, physical exam findings, and initial radiographic and laboratory data in the context of their chronic comorbidities is felt to place them at high risk for further clinical deterioration. Furthermore, it is not anticipated that the patient will be medically stable for discharge from the hospital within 2 midnights of admission. The following factors support the patient status of inpatient.   " The patient's presenting symptoms include SIRS with dehydration, bilateral multifocal pyelonephritis, UTI " The worrisome physical exam findings include profound dehydration, epigastric pain "  The initial radiographic and laboratory data are worrisome because of CT abdomen with multifocal bilateral pyelonephritis " The chronic co-morbidities include uncontrolled diabetes mellitus, hypertension with tachycardia   * I certify that at the point of admission it is my clinical judgment that the patient will require inpatient hospital care spanning beyond 2 midnights from the point of admission due to high intensity of service, high risk for further deterioration and high frequency of surveillance required.*    Time Spent on Admission:      Tawnee Clegg M.D. [facility] 02/28/2020, 6:17 PM

## 2020-02-29 LAB — GLUCOSE, CAPILLARY
Glucose-Capillary: 314 mg/dL — ABNORMAL HIGH (ref 70–99)
Glucose-Capillary: 308 mg/dL — ABNORMAL HIGH (ref 70–99)
Glucose-Capillary: 225 mg/dL — ABNORMAL HIGH (ref 70–99)
Glucose-Capillary: 122 mg/dL — ABNORMAL HIGH (ref 70–99)

## 2020-02-29 LAB — BASIC METABOLIC PANEL
Anion gap: 13 (ref 5–15)
BUN: 13 mg/dL (ref 6–20)
CO2: 23 mmol/L (ref 22–32)
Calcium: 8.2 mg/dL — ABNORMAL LOW (ref 8.9–10.3)
Chloride: 97 mmol/L — ABNORMAL LOW (ref 98–111)
Creatinine, Ser: 0.81 mg/dL (ref 0.44–1.00)
GFR calc Af Amer: >60 mL/min (ref >60)
GFR calc non Af Amer: >60 mL/min (ref >60)
Glucose, Bld: 338 mg/dL — ABNORMAL HIGH (ref 70–99)
Potassium: 3.2 mmol/L — ABNORMAL LOW (ref 3.5–5.1)
Sodium: 133 mmol/L — ABNORMAL LOW (ref 135–145)

## 2020-02-29 LAB — CBC
HCT: 34.3 % — ABNORMAL LOW (ref 36.0–46.0)
Hemoglobin: 10.6 g/dL — ABNORMAL LOW (ref 12.0–15.0)
MCH: 25.5 pg — ABNORMAL LOW (ref 26.0–34.0)
MCHC: 30.9 g/dL (ref 30.0–36.0)
MCV: 82.7 fL (ref 80.0–100.0)
Platelets: 240 10*3/uL (ref 150–400)
RBC: 4.15 MIL/uL (ref 3.87–5.11)
RDW: 14.1 % (ref 11.5–15.5)
WBC: 8.2 10*3/uL (ref 4.0–10.5)
nRBC: 0 % (ref 0.0–0.2)

## 2020-02-29 LAB — HIV ANTIBODY (ROUTINE TESTING W REFLEX): HIV Screen 4th Generation wRfx: NONREACTIVE

## 2020-02-29 LAB — URINE CULTURE

## 2020-02-29 LAB — TSH: TSH: 2.672 u[IU]/mL (ref 0.350–4.500)

## 2020-02-29 LAB — HEMOGLOBIN A1C
Hgb A1c MFr Bld: 13.6 % — ABNORMAL HIGH (ref 4.8–5.6)
Mean Plasma Glucose: 343.62 mg/dL

## 2020-02-29 MED ORDER — ACETAMINOPHEN 650 MG RE SUPP: 650.0000 mg | Freq: Four times a day (QID) | RECTAL | Status: AC

## 2020-02-29 MED ORDER — INSULIN GLARGINE 100 UNIT/ML ~~LOC~~ SOLN
15.0000 [IU] | Freq: Every day | SUBCUTANEOUS | Status: DC
  Administered 2020-02-29 – 2020-03-01 (×2): 15 [IU] via SUBCUTANEOUS
  Filled 2020-02-29 (×3): qty 0.15

## 2020-02-29 MED ORDER — METOPROLOL SUCCINATE ER 25 MG PO TB24
12.5000 mg | ORAL_TABLET | Freq: Two times a day (BID) | ORAL | Status: DC
  Administered 2020-02-29 – 2020-03-02 (×4): 12.5 mg via ORAL
  Filled 2020-02-29 (×4): qty 1

## 2020-02-29 MED ORDER — ACETAMINOPHEN 500 MG PO TABS
1000.0000 mg | ORAL_TABLET | Freq: Four times a day (QID) | ORAL | Status: AC
  Administered 2020-02-29 (×4): 1000 mg via ORAL
  Filled 2020-02-29 (×4): qty 2

## 2020-02-29 NOTE — Progress Notes (Signed)
Inpatient Diabetes Program Recommendations  AACE/ADA: New Consensus Statement on Inpatient Glycemic Control (2015)  Target Ranges:  Prepandial:   less than 140 mg/dL      Peak postprandial:   less than 180 mg/dL (1-2 hours)      Critically ill patients:  140 - 180 mg/dL   Lab Results  Component Value Date   GLUCAP 308 (H) 02/29/2020   HGBA1C 13.6 (H) 02/28/2020    Review of Glycemic Control  Diabetes history: DM2 Outpatient Diabetes medications: Trulicity 0.75 mg once/week (not taking now), metformin 1000 mg bid Current orders for Inpatient glycemic control: Lantus 15 units QHS, Novolog 0-15 units tidwc and hs + 3 units tidwc  HgbA1C - 13.6% - poor glycemic control Blood sugars 314-338 mg/dL thus far today.  Inpatient Diabetes Program Recommendations:     Increase Novolog to 5 units tidwc if pt eats > 50%.  Will need to go home on both Lantus and Novolog. Will see pt in am to discuss her HgbA1C and going home on insulin.  Thank you. Ailene Ards, RD, LDN, CDE Inpatient Diabetes Coordinator [phone]

## 2020-02-29 NOTE — Progress Notes (Signed)
PROGRESS NOTE    Julia [name]  XBD:[id number] DOB: Jul 07, 1971 DOA: 02/28/2020 PCP: Shawnee Knapp, MD   Brief Narrative: 48 year old female with history of diabetes mellitus type 2, NIDDM, hypertension, hyperlipidemia, hypothyroidism, had COVID-19 in February, received  antibody infusion, also had UTI around the same time.  Patient reported that she developed fevers yesterday 100.3 F with chills, today started having nausea and vomiting and epigastric abdominal pain.  Patient reports that since the last month she has not been taking her Trulicity however has been taking her Metformin daily. At the time of my examination, no active vomiting, epigastric abdominal pain 1/10, just feels a sore, had mild right upper quadrant abdominal pain currently improving.  Last BM yesterday.  No hematemesis hematochezia or melena.  States that she has not been eating well in the last 2 days.  In ED temp 99.2 F, pulse 107-113, BP 147/99, O2 sats 97% on room air, respiratory rate 22  CBC unremarkable, WBCs 9.7, lactic acid 1.2 Sodium 130, potassium 4.2, glucose 491, CO2 25, anion gap 17 Beta hydroxybutyrate pending UA positive for UTI and ketones ABG showed pH 7.4, PCO2 37.6, PO2 62.9, bicarb 24 Assessment & Plan:   Principal Problem:   Pyelonephritis Active Problems:   HTN (hypertension)   Type 2 diabetes mellitus with hyperglycemia, without long-term current use of insulin (HCC)   Hyperlipidemia   Intractable nausea and vomiting   #1 pyelonephritis-patient with history of type 2 diabetes uncontrolled admitted with persistent nausea vomiting and fever with urinary findings and CT findings consistent with pyelonephritis. Patient spiked temp last night T-max 101.7. Patient remains tachycardic and febrile. Continue IV Rocephin.  Continue IV fluids for now. Follow-up final blood cultures and urine culture. CT scan of the abdomen shows no renal abscess or obstruction, bilateral multifocal pyelonephritis  noted. Patient denies history of previous UTI or pyelonephritis.  #2 uncontrolled type 2 diabetes-patient takes Trulicity and Metformin at home.  She prefers to take insulin as Trulicity cause her to have a bad reaction. Her gap was 17 upon admission now down to 13. UA showed ketones Beta hydroxybutyrate was 2.23 elevated Hemoglobin A1c is 13.6 Increase Lantus to 15 units nightly. CBG (last 3)  Recent Labs    02/28/20 1628 02/28/20 2156 02/29/20 0746  GLUCAP 360* 229* 314*    #3 pseudohyponatremia secondary to hyperglycemia should improve with treatment.  #4 essential hypertension -she was hypotensive on admission now her blood pressure is trending down I will decrease her metoprolol dose   Blood pressure is 118/79.  #5 intractable nausea vomiting resolving continue as needed Reglan and Zofran  #6 hypothyroidism continue Synthroid  Estimated body mass index is 28.32 kg/m as calculated from the following:   Height as of this encounter: 5\' 4"  (1.626 m).   Weight as of this encounter: 74.8 kg.  DVT prophylaxis: Lovenox Code Status: Full code Family Communication: Discussed with patient Disposition Plan: Patient came from home plan is to discharge her back home Barriers to discharge pyelonephritis patient still febrile tachycardic  Consultants:   ED physician discussed with Dr. Gloriann Loan over the phone  Procedures: None Antimicrobials: Rocephin  Subjective: Patient resting in bed her nausea and vomiting has improved she is starting to feel hungry denies any abdominal pain  Objective: Vitals:   02/29/20 0122 02/29/20 0340 02/29/20 0341 02/29/20 0741  BP: 119/84 101/66  118/79  Pulse: (!) 107 92  87  Resp: (!) 22 20  20   Temp: (!) 101.3 F (38.5 C)  99 F (37.2 C)  97.7 F (36.5 C)  TempSrc: Oral Oral  Oral  SpO2: 97% (!) 89% 95% 96%  Weight:      Height:        Intake/Output Summary (Last 24 hours) at 02/29/2020 1039 Last data filed at 02/29/2020 0350 Gross per 24  hour  Intake 892.16 ml  Output --  Net 892.16 ml   Filed Weights   02/28/20 1138  Weight: 74.8 kg    Examination:  General exam: Appears calm and comfortable  Respiratory system: Clear to auscultation. Respiratory effort normal. Cardiovascular system: S1 & S2 heard, RRR. No JVD, murmurs, rubs, gallops or clicks. No pedal edema. Gastrointestinal system: Abdomen is nondistended, soft and nontender. No organomegaly or masses felt. Normal bowel sounds heard. Central nervous system: Alert and oriented. No focal neurological deficits. Extremities: Symmetric 5 x 5 power. Skin: No rashes, lesions or ulcers Psychiatry: Judgement and insight appear normal. Mood & affect appropriate.     Data Reviewed: I have personally reviewed following labs and imaging studies  CBC: Recent Labs  Lab 02/28/20 1140 02/29/20 0540  WBC 9.7 8.2  HGB 13.4 10.6*  HCT 42.2 34.3*  MCV 79.5* 82.7  PLT 363 240   Basic Metabolic Panel: Recent Labs  Lab 02/28/20 1140 02/28/20 2003 02/29/20 0540  NA 130* 134* 133*  K 4.2 3.6 3.2*  CL 88* 95* 97*  CO2 25 26 23   GLUCOSE 491* 284* 338*  BUN 12 9 13   CREATININE 0.90 0.78 0.81  CALCIUM 9.5 8.7* 8.2*   GFR: Estimated Creatinine Clearance: 82.2 mL/min (by C-G formula based on SCr of 0.81 mg/dL). Liver Function Tests: Recent Labs  Lab 02/28/20 1140  AST 16  ALT 10  ALKPHOS 171*  BILITOT 1.0  PROT 9.1*  ALBUMIN 4.1   Recent Labs  Lab 02/28/20 1140  LIPASE 23   No results for input(s): AMMONIA in the last 168 hours. Coagulation Profile: No results for input(s): INR, PROTIME in the last 168 hours. Cardiac Enzymes: No results for input(s): CKTOTAL, CKMB, CKMBINDEX, TROPONINI in the last 168 hours. BNP (last 3 results) No results for input(s): PROBNP in the last 8760 hours. HbA1C: Recent Labs    02/28/20 2003  HGBA1C 13.6*   CBG: Recent Labs  Lab 02/28/20 1628 02/28/20 2156 02/29/20 0746  GLUCAP 360* 229* 314*   Lipid  Profile: No results for input(s): CHOL, HDL, LDLCALC, TRIG, CHOLHDL, LDLDIRECT in the last 72 hours. Thyroid Function Tests: Recent Labs    02/28/20 2003  TSH 2.672   Anemia Panel: No results for input(s): VITAMINB12, FOLATE, FERRITIN, TIBC, IRON, RETICCTPCT in the last 72 hours. Sepsis Labs: Recent Labs  Lab 02/28/20 2003 02/28/20 2017 02/28/20 2319  PROCALCITON 2.63  --   --   LATICACIDVEN  --  1.5 0.8    Recent Results (from the past 240 hour(s))  Culture, blood (routine x 2)     Status: None (Preliminary result)   Collection Time: 02/28/20  8:03 PM   Specimen: BLOOD  Result Value Ref Range Status   Specimen Description   Final    BLOOD RIGHT ANTECUBITAL Performed at [facility] [address] [place], [place]    Special Requests   Final    BOTTLES DRAWN AEROBIC ONLY Blood Culture adequate volume Performed at [facility], [address] [place], [place]    Culture   Final    NO GROWTH < 12 HOURS Performed at  [facility], [address] [place], [place] [address]    Report Status PENDING  Incomplete  Culture, blood (routine x 2)     Status: None (Preliminary result)   Collection Time: 02/28/20  8:03 PM   Specimen: BLOOD  Result Value Ref Range Status   Specimen Description   Final    BLOOD RIGHT HAND Performed at [facility], [address], [place], [place] [address]    Special Requests   Final    BOTTLES DRAWN AEROBIC ONLY Blood Culture results may not be optimal due to an inadequate volume of blood received in culture bottles Performed at [facility], [address], [place], [place] [address]    Culture   Final    NO GROWTH < 12 HOURS Performed at [facility], [address] [place], [place] [address]    Report Status PENDING  Incomplete         Radiology Studies: CT Abdomen Pelvis W Contrast  Result Date: 02/28/2020 CLINICAL  DATA:  Nausea and vomiting, bloody emesis, right upper quadrant pain EXAM: CT ABDOMEN AND PELVIS WITH CONTRAST TECHNIQUE: Multidetector CT imaging of the abdomen and pelvis was performed using the standard protocol following bolus administration of intravenous contrast. CONTRAST:  OMNIPAQUE IOHEXOL 300 MG/ML  SOLN COMPARISON:  02/28/2020 FINDINGS: Lower chest: No acute pleural or parenchymal lung disease. Hepatobiliary: There is mild fatty infiltration of the liver. Minimal gallbladder sludge is identified without cholelithiasis or cholecystitis. No biliary dilation. Pancreas: Unremarkable. No pancreatic ductal dilatation or surrounding inflammatory changes. Spleen: There is borderline enlargement the spleen measuring 13.4 cm in craniocaudal length. No focal splenic abnormalities. Adrenals/Urinary Tract: There is markedly heterogeneous enhancement of the bilateral kidneys consistent with multifocal bilateral pyelonephritis. I do not see any evidence of renal abscess at this time. No obstructive uropathy. No urinary tract calculi. The bladder is unremarkable. Adrenals are normal. Stomach/Bowel: No bowel obstruction or ileus. Normal appendix right lower quadrant. Minimal colonic diverticulosis without diverticulitis. No bowel wall thickening or inflammatory changes. Vascular/Lymphatic: No significant vascular findings are present. No enlarged abdominal or pelvic lymph nodes. Reproductive: Uterus and bilateral adnexa are unremarkable. Other: No abdominal wall hernia or abnormality. No abdominopelvic ascites. Musculoskeletal: No acute or destructive bony lesions. Reconstructed images demonstrate no additional findings. IMPRESSION: 1. Bilateral multifocal pyelonephritis without evidence of renal abscess. 2. Mild fatty infiltration of the liver. 3. Borderline splenomegaly. 4. Minimal colonic diverticulosis without diverticulitis. Electronically Signed   By: Sharlet Salina M.D.   On: 02/28/2020 15:33   DG CHEST  PORT 1 VIEW  Result Date: 02/28/2020 CLINICAL DATA:  Sepsis EXAM: PORTABLE CHEST 1 VIEW COMPARISON:  01/22/2020 FINDINGS: Single frontal view of the chest demonstrates a stable cardiac silhouette. No airspace disease, effusion, or pneumothorax. Lung volumes are diminished. IMPRESSION: 1. Low lung volumes, no acute process. Electronically Signed   By: Sharlet Salina M.D.   On: 02/28/2020 21:02   US Abdomen Limited RUQ  Result Date: 02/28/2020 CLINICAL DATA:  Vomiting for 6 weeks. EXAM: ULTRASOUND ABDOMEN LIMITED RIGHT UPPER QUADRANT COMPARISON:  None. FINDINGS: Gallbladder: There is a small amount of sludge in the gallbladder. There is a comet tail sign in the fundus which could represent minimal adenomyomatosis. No stones. Gallbladder wall thickness is normal. Common bile duct: Diameter: 4 mm, normal. Liver: Diffuse hepatic steatosis with focal fatty sparing adjacent to the gallbladder. No liver masses. Portal vein is patent on color Doppler imaging with normal direction of  blood flow towards the liver. Other: None. IMPRESSION: 1. No acute abnormality. 2. Hepatic steatosis. Electronically Signed   By: Francene Boyers M.D.   On: 02/28/2020 13:54        Scheduled Meds:  acetaminophen  1,000 mg Oral Q6H   Or   acetaminophen  650 mg Rectal Q6H   enoxaparin (LOVENOX) injection  40 mg Subcutaneous Q24H   fluticasone  2 spray Each Nare Daily   insulin aspart  0-15 Units Subcutaneous TID WC   insulin aspart  0-5 Units Subcutaneous QHS   insulin aspart  3 Units Subcutaneous TID WC   insulin glargine  9 Units Subcutaneous QHS   levothyroxine  75 mcg Oral Q0600   loratadine  10 mg Oral Daily   metoCLOPramide (REGLAN) injection  5 mg Intravenous Q6H   metoprolol succinate  50 mg Oral BID   pantoprazole  40 mg Oral Daily   Continuous Infusions:  sodium chloride 125 mL/hr at 02/29/20 0612   cefTRIAXone (ROCEPHIN)  IV       LOS: 1 day     Alwyn Ren, MD 02/29/2020, 10:39  AM

## 2020-03-01 LAB — BASIC METABOLIC PANEL
Anion gap: 10 (ref 5–15)
BUN: 11 mg/dL (ref 6–20)
CO2: 21 mmol/L — ABNORMAL LOW (ref 22–32)
Calcium: 7.8 mg/dL — ABNORMAL LOW (ref 8.9–10.3)
Chloride: 103 mmol/L (ref 98–111)
Creatinine, Ser: 0.5 mg/dL (ref 0.44–1.00)
GFR calc Af Amer: >60 mL/min (ref >60)
GFR calc non Af Amer: >60 mL/min (ref >60)
Glucose, Bld: 134 mg/dL — ABNORMAL HIGH (ref 70–99)
Potassium: 2.9 mmol/L — ABNORMAL LOW (ref 3.5–5.1)
Sodium: 134 mmol/L — ABNORMAL LOW (ref 135–145)

## 2020-03-01 LAB — CBC
HCT: 31.7 % — ABNORMAL LOW (ref 36.0–46.0)
Hemoglobin: 10 g/dL — ABNORMAL LOW (ref 12.0–15.0)
MCH: 26 pg (ref 26.0–34.0)
MCHC: 31.5 g/dL (ref 30.0–36.0)
MCV: 82.6 fL (ref 80.0–100.0)
Platelets: 234 10*3/uL (ref 150–400)
RBC: 3.84 MIL/uL — ABNORMAL LOW (ref 3.87–5.11)
RDW: 14.4 % (ref 11.5–15.5)
WBC: 6.3 10*3/uL (ref 4.0–10.5)
nRBC: 0 % (ref 0.0–0.2)

## 2020-03-01 LAB — MAGNESIUM: Magnesium: 1.9 mg/dL (ref 1.7–2.4)

## 2020-03-01 LAB — GLUCOSE, CAPILLARY
Glucose-Capillary: 106 mg/dL — ABNORMAL HIGH (ref 70–99)
Glucose-Capillary: 200 mg/dL — ABNORMAL HIGH (ref 70–99)
Glucose-Capillary: 125 mg/dL — ABNORMAL HIGH (ref 70–99)
Glucose-Capillary: 214 mg/dL — ABNORMAL HIGH (ref 70–99)

## 2020-03-01 MED ORDER — INSULIN ASPART 100 UNIT/ML ~~LOC~~ SOLN
5.0000 [IU] | Freq: Three times a day (TID) | SUBCUTANEOUS | Status: DC
  Administered 2020-03-01 – 2020-03-02 (×5): 5 [IU] via SUBCUTANEOUS

## 2020-03-01 MED ORDER — ACETAMINOPHEN 325 MG PO TABS
650.0000 mg | ORAL_TABLET | Freq: Four times a day (QID) | ORAL | Status: DC | PRN
  Administered 2020-03-01 (×2): 650 mg via ORAL
  Filled 2020-03-01 (×2): qty 2

## 2020-03-01 MED ORDER — POTASSIUM CHLORIDE CRYS ER 20 MEQ PO TBCR
40.0000 meq | EXTENDED_RELEASE_TABLET | Freq: Once | ORAL | Status: AC
  Administered 2020-03-01: 09:00:00 40 meq via ORAL
  Filled 2020-03-01: qty 2

## 2020-03-01 MED ORDER — LIVING WELL WITH DIABETES BOOK
Freq: Once | Status: AC
  Filled 2020-03-01: qty 1

## 2020-03-01 MED ORDER — INSULIN STARTER KIT- PEN NEEDLES (ENGLISH)
1.0000 | Freq: Once | Status: AC
  Administered 2020-03-01: 1
  Filled 2020-03-01: qty 1

## 2020-03-01 NOTE — Progress Notes (Signed)
Inpatient Diabetes Program Recommendations  AACE/ADA: New Consensus Statement on Inpatient Glycemic Control (2015)  Target Ranges:  Prepandial:   less than 140 mg/dL      Peak postprandial:   less than 180 mg/dL (1-2 hours)      Critically ill patients:  140 - 180 mg/dL   Lab Results  Component Value Date   GLUCAP 200 (H) 03/01/2020   HGBA1C 13.6 (H) 02/28/2020    Review of Glycemic Control  Met with pt today regarding her diabetes control at home. Discussed HgbA1C of 13.6% (average blood sugar of 344 mg/dL) and importance of reducing to around 7%. Pt states her PCP referred her to an Endo, and told her to clean up her diet and eat more plant-based foods. Pt states she will call her PCP and ask for another referral. Has meter although has not been checking blood sugars regularly. Has recently lost 13 pounds.  Discussed basic pathophysiology of DM Type 2, basic home care, importance of checking CBGs and maintaining good CBG control to prevent long-term and short-term complications. Discussed impact of nutrition, exercise, stress, sickness, and medications on diabetes control. Ordered Living Well with diabetes booklet and encouraged patient to read through entire book. Ordered insulin pen starter kit and demonstrated insulin pen administration. Pt was previously on Trulicity and feels comfortable with injecting insulin. Long discussion regarding diet, healthy choices and reducing CHOs and portion sizes. Pt voices understanding.  Inpatient Diabetes Program Recommendations:  (for home)  Lantus Solostar pen - 15 units QHS Novolog Flexpen 5 units tidwc  Insulin pen needles - [phone]  Pt seems motivated to make lifestyle changes along with diet and exercise, to control her blood sugars. To f/u with PCP and ask for referral to Endo.  Continue to follow.   Thank you. Lorenda Peck, RD, LDN, CDE Inpatient Diabetes Coordinator [phone]

## 2020-03-01 NOTE — Progress Notes (Signed)
PROGRESS NOTE    Julia [name]  ZOX:[id number] DOB: 02-05-1971 DOA: 02/28/2020 PCP: Sherren Mocha, MD   Brief Narrative:49 year old female with history of diabetes mellitus type 2, NIDDM, hypertension, hyperlipidemia, hypothyroidism, had COVID-19 in February, received antibody infusion, also had UTI around the same time.Patient reported that she developed fevers yesterday 100.3 F with chills, today started having nausea and vomiting and epigastric abdominal pain. Patient reports that since the last month she has not been taking her Trulicity however has been taking her Metformin daily. At the time of my examination, no active vomiting, epigastric abdominal pain 1/10, just feels a sore, hadmild right upper quadrant abdominal pain currently improving. Last BM yesterday. No hematemesis hematochezia or melena. States that she has not been eating well in the last 2 days.  In ED temp 99.2 F, pulse 107-113, BP 147/99, O2 sats 97% on room air, respiratory rate 22  CBC unremarkable, WBCs 9.7, lactic acid 1.2 Sodium 130, potassium 4.2, glucose 491, CO2 25, anion gap 17 Beta hydroxybutyrate pending UA positive for UTI and ketones ABG showed pH 7.4, PCO2 37.6, PO2 62.9, bicarb 24 Assessment & Plan:   Principal Problem:   Pyelonephritis Active Problems:   HTN (hypertension)   Type 2 diabetes mellitus with hyperglycemia, without long-term current use of insulin (HCC)   Hyperlipidemia   Intractable nausea and vomiting    #1 pyelonephritis-patient with history of type 2 diabetes uncontrolled admitted with persistent nausea vomiting and fever with urinary findings and CT findings consistent with pyelonephritis. Patient spiked temp last night T-max 100.8 down from 101.7. Patient remains tachycardic  Continue IV Rocephin. Follow-up final blood cultures  Urine culture growing multiple species CT scan of the abdomen shows no renal abscess or obstruction, bilateral multifocal pyelonephritis  noted. Patient denies history of previous UTI or pyelonephritis.  #2 uncontrolled type 2 diabetes-patient takes Trulicity and Metformin at home.  She prefers to take insulin as Trulicity cause her to have a bad reaction. Her gap was 17 upon admission now down to 10 UA showed ketones Beta hydroxybutyrate was 2.23 elevated Hemoglobin A1c is 13.6 Lantus dose was increased to 15 units on 02/29/2020 continue the same.  NovoLog 5 units 3 times daily.  Appreciate diabetic coordinator input. CBG (last 3)  Recent Labs    02/29/20 1636 02/29/20 2109 03/01/20 0729  GLUCAP 225* 122* 106*     #3 pseudohyponatremia secondary to hyperglycemia-improving sodium 134.   #4 essential hypertension -she was hypotensive on admission now her blood pressure is trending up 140/86.  Continue metoprolol.    #5 intractable nausea vomiting resolving continue as needed Reglan and Zofran  #6 hypokalemia potassium 2.9 replete check magnesium.  #6 hypothyroidism continue Synthroid  #7 Covid positive in February 2021 Estimated body mass index is 28.32 kg/m as calculated from the following:   Height as of this encounter: 5\' 4"  (1.626 m).   Weight as of this encounter: 74.8 kg.  DVT prophylaxis: Lovenox Code Status: Full code Family Communication: Discussed with patient Disposition Plan: Patient came from home plan is to discharge her back home Barriers to discharge pyelonephritis patient still febrile tachycardic, blood cultures pending  Consultants:   ED physician discussed with Dr. over the phone  Procedures: None Antimicrobials: Rocephin Subjective: Patient resting in bed awake and alert nausea is better able to tolerate some p.o. intake no diarrhea  Objective: Vitals:   02/29/20 1640 02/29/20 2107 02/29/20 2300 03/01/20 0614  BP: (!) 142/93 119/72  140/86  Pulse: 96  95  82  Resp: (!) 22 20  20   Temp: 99.7 F (37.6 C) (!) 100.4 F (38 C) 98.7 F (37.1 C) 98 F (36.7 C)  TempSrc:  Oral Oral Oral Oral  SpO2: 100% 100%  99%  Weight:      Height:        Intake/Output Summary (Last 24 hours) at 03/01/2020 0812 Last data filed at 03/01/2020 0600 Gross per 24 hour  Intake 2767.51 ml  Output 1150 ml  Net 1617.51 ml   Filed Weights   02/28/20 1138  Weight: 74.8 kg    Examination:  General exam: Appears calm and comfortable  Respiratory system: Clear to auscultation. Respiratory effort normal. Cardiovascular system: S1 & S2 heard, RRR. No JVD, murmurs, rubs, gallops or clicks. No pedal edema. Gastrointestinal system: Abdomen is nondistended, soft and nontender. No organomegaly or masses felt. Normal bowel sounds heard. Central nervous system: Alert and oriented. No focal neurological deficits. Extremities: Symmetric 5 x 5 power. Skin: No rashes, lesions or ulcers Psychiatry: Judgement and insight appear normal. Mood & affect appropriate.     Data Reviewed: I have personally reviewed following labs and imaging studies  CBC: Recent Labs  Lab 02/28/20 1140 02/29/20 0540 03/01/20 0527  WBC 9.7 8.2 6.3  HGB 13.4 10.6* 10.0*  HCT 42.2 34.3* 31.7*  MCV 79.5* 82.7 82.6  PLT 363 240 234   Basic Metabolic Panel: Recent Labs  Lab 02/28/20 1140 02/28/20 2003 02/29/20 0540 03/01/20 0527  NA 130* 134* 133* 134*  K 4.2 3.6 3.2* 2.9*  CL 88* 95* 97* 103  CO2 25 26 23  21*  GLUCOSE 491* 284* 338* 134*  BUN 12 9 13 11   CREATININE 0.90 0.78 0.81 0.50  CALCIUM 9.5 8.7* 8.2* 7.8*   GFR: Estimated Creatinine Clearance: 83.3 mL/min (by C-G formula based on SCr of 0.5 mg/dL). Liver Function Tests: Recent Labs  Lab 02/28/20 1140  AST 16  ALT 10  ALKPHOS 171*  BILITOT 1.0  PROT 9.1*  ALBUMIN 4.1   Recent Labs  Lab 02/28/20 1140  LIPASE 23   No results for input(s): AMMONIA in the last 168 hours. Coagulation Profile: No results for input(s): INR, PROTIME in the last 168 hours. Cardiac Enzymes: No results for input(s): CKTOTAL, CKMB, CKMBINDEX,  TROPONINI in the last 168 hours. BNP (last 3 results) No results for input(s): PROBNP in the last 8760 hours. HbA1C: Recent Labs    02/28/20 2003  HGBA1C 13.6*   CBG: Recent Labs  Lab 02/29/20 0746 02/29/20 1151 02/29/20 1636 02/29/20 2109 03/01/20 0729  GLUCAP 314* 308* 225* 122* 106*   Lipid Profile: No results for input(s): CHOL, HDL, LDLCALC, TRIG, CHOLHDL, LDLDIRECT in the last 72 hours. Thyroid Function Tests: Recent Labs    02/28/20 2003  TSH 2.672   Anemia Panel: No results for input(s): VITAMINB12, FOLATE, FERRITIN, TIBC, IRON, RETICCTPCT in the last 72 hours. Sepsis Labs: Recent Labs  Lab 02/28/20 2003 02/28/20 2017 02/28/20 2319  PROCALCITON 2.63  --   --   LATICACIDVEN  --  1.5 0.8    Recent Results (from the past 240 hour(s))  Urine culture     Status: Abnormal   Collection Time: 02/28/20  5:20 PM   Specimen: Urine, Clean Catch  Result Value Ref Range Status   Specimen Description   Final    URINE, CLEAN CATCH Performed at [facility], [address] [place], [place]    Special Requests   Final  NONE Performed at [facility], [place] [address], [place], [place] [address]    Culture MULTIPLE SPECIES PRESENT, SUGGEST RECOLLECTION (A)  Final   Report Status 02/29/2020 FINAL  Final  Culture, blood (routine x 2)     Status: None (Preliminary result)   Collection Time: 02/28/20  8:03 PM   Specimen: BLOOD  Result Value Ref Range Status   Specimen Description   Final    BLOOD RIGHT ANTECUBITAL Performed at [facility] [address] [place], [place] [address]    Special Requests   Final    BOTTLES DRAWN AEROBIC ONLY Blood Culture adequate volume Performed at [facility] [address], [place], [place] [address]    Culture   Final    NO GROWTH < 12 HOURS Performed at [facility] [address] [place], [place] [address]    Report Status  PENDING  Incomplete  Culture, blood (routine x 2)     Status: None (Preliminary result)   Collection Time: 02/28/20  8:03 PM   Specimen: BLOOD  Result Value Ref Range Status   Specimen Description   Final    BLOOD RIGHT HAND Performed at [facility] [address] [place], [place] [address]    Special Requests   Final    BOTTLES DRAWN AEROBIC ONLY Blood Culture results may not be optimal due to an inadequate volume of blood received in culture bottles Performed at [facility] [address], [place], [place] [address]    Culture   Final    NO GROWTH < 12 HOURS Performed at [facility] [address] [place], [place] [address]    Report Status PENDING  Incomplete         Radiology Studies: CT Abdomen Pelvis W Contrast  Result Date: 02/28/2020 CLINICAL DATA:  Nausea and vomiting, bloody emesis, right upper quadrant pain EXAM: CT ABDOMEN AND PELVIS WITH CONTRAST TECHNIQUE: Multidetector CT imaging of the abdomen and pelvis was performed using the standard protocol following bolus administration of intravenous contrast. CONTRAST:  140mL OMNIPAQUE IOHEXOL 300 MG/ML  SOLN COMPARISON:  02/28/2020 FINDINGS: Lower chest: No acute pleural or parenchymal lung disease. Hepatobiliary: There is mild fatty infiltration of the liver. Minimal gallbladder sludge is identified without cholelithiasis or cholecystitis. No biliary dilation. Pancreas: Unremarkable. No pancreatic ductal dilatation or surrounding inflammatory changes. Spleen: There is borderline enlargement the spleen measuring 13.4 cm in craniocaudal length. No focal splenic abnormalities. Adrenals/Urinary Tract: There is markedly heterogeneous enhancement of the bilateral kidneys consistent with multifocal bilateral pyelonephritis. I do not see any evidence of renal abscess at this time. No obstructive uropathy. No urinary tract calculi. The bladder is unremarkable. Adrenals are normal.  Stomach/Bowel: No bowel obstruction or ileus. Normal appendix right lower quadrant. Minimal colonic diverticulosis without diverticulitis. No bowel wall thickening or inflammatory changes. Vascular/Lymphatic: No significant vascular findings are present. No enlarged abdominal or pelvic lymph nodes. Reproductive: Uterus and bilateral adnexa are unremarkable. Other: No abdominal wall hernia or abnormality. No abdominopelvic ascites. Musculoskeletal: No acute or destructive bony lesions. Reconstructed images demonstrate no additional findings. IMPRESSION: 1. Bilateral multifocal pyelonephritis without evidence of renal abscess. 2. Mild fatty infiltration of the liver. 3. Borderline splenomegaly. 4. Minimal colonic diverticulosis without diverticulitis. Electronically Signed   By: Randa Ngo M.D.   On: 02/28/2020 15:33   DG CHEST PORT 1 VIEW  Result Date: 02/28/2020 CLINICAL DATA:  Sepsis EXAM: PORTABLE CHEST 1 VIEW  COMPARISON:  01/22/2020 FINDINGS: Single frontal view of the chest demonstrates a stable cardiac silhouette. No airspace disease, effusion, or pneumothorax. Lung volumes are diminished. IMPRESSION: 1. Low lung volumes, no acute process. Electronically Signed   By: Sharlet Salina M.D.   On: 02/28/2020 21:02   US Abdomen Limited RUQ  Result Date: 02/28/2020 CLINICAL DATA:  Vomiting for 6 weeks. EXAM: ULTRASOUND ABDOMEN LIMITED RIGHT UPPER QUADRANT COMPARISON:  None. FINDINGS: Gallbladder: There is a small amount of sludge in the gallbladder. There is a comet tail sign in the fundus which could represent minimal adenomyomatosis. No stones. Gallbladder wall thickness is normal. Common bile duct: Diameter: 4 mm, normal. Liver: Diffuse hepatic steatosis with focal fatty sparing adjacent to the gallbladder. No liver masses. Portal vein is patent on color Doppler imaging with normal direction of blood flow towards the liver. Other: None. IMPRESSION: 1. No acute abnormality. 2. Hepatic steatosis.  Electronically Signed   By: Francene Boyers M.D.   On: 02/28/2020 13:54        Scheduled Meds: . enoxaparin (LOVENOX) injection  40 mg Subcutaneous Q24H  . fluticasone  2 spray Each Nare Daily  . insulin aspart  0-15 Units Subcutaneous TID WC  . insulin aspart  0-5 Units Subcutaneous QHS  . insulin aspart  3 Units Subcutaneous TID WC  . insulin aspart  5 Units Subcutaneous TID WC  . insulin glargine  15 Units Subcutaneous QHS  . levothyroxine  75 mcg Oral Q0600  . loratadine  10 mg Oral Daily  . metoprolol succinate  12.5 mg Oral BID  . pantoprazole  40 mg Oral Daily   Continuous Infusions: . sodium chloride 125 mL/hr at 03/01/20 0600  . cefTRIAXone (ROCEPHIN)  IV 2 g (02/29/20 1658)     LOS: 2 days     Alwyn Ren, MD  03/01/2020, 8:12 AM

## 2020-03-02 ENCOUNTER — Ambulatory Visit: Payer: 59 | Admitting: Sports Medicine

## 2020-03-02 LAB — BASIC METABOLIC PANEL
Anion gap: 8 (ref 5–15)
BUN: 7 mg/dL (ref 6–20)
CO2: 25 mmol/L (ref 22–32)
Calcium: 8.1 mg/dL — ABNORMAL LOW (ref 8.9–10.3)
Chloride: 103 mmol/L (ref 98–111)
Creatinine, Ser: 0.48 mg/dL (ref 0.44–1.00)
GFR calc Af Amer: >60 mL/min (ref >60)
GFR calc non Af Amer: >60 mL/min (ref >60)
Glucose, Bld: 164 mg/dL — ABNORMAL HIGH (ref 70–99)
Potassium: 3 mmol/L — ABNORMAL LOW (ref 3.5–5.1)
Sodium: 136 mmol/L (ref 135–145)

## 2020-03-02 LAB — GLUCOSE, CAPILLARY
Glucose-Capillary: 160 mg/dL — ABNORMAL HIGH (ref 70–99)
Glucose-Capillary: 202 mg/dL — ABNORMAL HIGH (ref 70–99)

## 2020-03-02 MED ORDER — INSULIN ASPART 100 UNIT/ML FLEXPEN: 5.0000 [IU] | PEN_INJECTOR | Freq: Three times a day (TID) | SUBCUTANEOUS | 11 refills | Status: DC

## 2020-03-02 MED ORDER — POTASSIUM CHLORIDE CRYS ER 20 MEQ PO TBCR
40.0000 meq | EXTENDED_RELEASE_TABLET | ORAL | Status: AC
  Administered 2020-03-02 (×2): 40 meq via ORAL
  Filled 2020-03-02 (×2): qty 2

## 2020-03-02 MED ORDER — INSULIN PEN NEEDLE 31G X 5 MM MISC: 5.0000 [IU] | Freq: Three times a day (TID) | 3 refills | Status: DC

## 2020-03-02 MED ORDER — INSULIN GLARGINE 100 UNIT/ML ~~LOC~~ SOLN: 15.0000 [IU] | Freq: Every day | SUBCUTANEOUS | 11 refills | Status: DC

## 2020-03-02 MED ORDER — INSULIN ASPART 100 UNIT/ML ~~LOC~~ SOLN: 5.0000 [IU] | Freq: Three times a day (TID) | SUBCUTANEOUS | 11 refills | Status: DC

## 2020-03-02 MED ORDER — CEFDINIR 300 MG PO CAPS: 300.0000 mg | ORAL_CAPSULE | Freq: Two times a day (BID) | ORAL | 0 refills | Status: DC

## 2020-03-02 MED ORDER — ONDANSETRON HCL 4 MG PO TABS: 4.0000 mg | ORAL_TABLET | Freq: Four times a day (QID) | ORAL | 0 refills | Status: DC | PRN

## 2020-03-02 NOTE — Progress Notes (Signed)
Went over discharge instructions w/ patient. Patient verbalized understanding.  

## 2020-03-02 NOTE — Discharge Summary (Signed)
Physician Discharge Summary  Julia [name] MHD:[id number] DOB: 02-Feb-1971 DOA: 02/28/2020  PCP: Sherren Mocha, MD  Admit date: 02/28/2020 Discharge date: 03/02/2020  Admitted From: HOME Disposition: HOME  Recommendations for Outpatient Follow-up:  1. Follow up with PCP in 1-2 weeks 2. Please obtain BMP/CBC in one week  Home Health: None  equipment/Devices none Discharge Condition: Stable and improved CODE STATUS full code Diet recommendation: Cardiac diet  brief/Interim Summary:49 year old female with history of diabetes mellitus type 2, NIDDM, hypertension, hyperlipidemia, hypothyroidism, had COVID-19 in February, received antibody infusion, also had UTI around the same time.Patient reported that she developed fevers yesterday 100.3 F with chills, today started having nausea and vomiting and epigastric abdominal pain. Patient reports that since the last month she has not been taking her Trulicity however has been taking her Metformin daily. At the time of my examination, no active vomiting, epigastric abdominal pain 1/10, just feels a sore, hadmild right upper quadrant abdominal pain currently improving. Last BM yesterday. No hematemesis hematochezia or melena. States that she has not been eating well in the last 2 days.  In ED temp 99.2 F, pulse 107-113, BP 147/99, O2 sats 97% on room air, respiratory rate 22  CBC unremarkable, WBCs 9.7, lactic acid 1.2 Sodium 130, potassium 4.2, glucose 491, CO2 25, anion gap 17 Beta hydroxybutyrate pending UA positive for UTI and ketones ABG showed pH 7.4, PCO2 37.6, PO2 62.9, bicarb 24   Discharge Diagnoses:  Principal Problem:   Pyelonephritis Active Problems:   HTN (hypertension)   Type 2 diabetes mellitus with hyperglycemia, without long-term current use of insulin (HCC)   Hyperlipidemia   Intractable nausea and vomiting   #1 pyelonephritis-patient with history of type 2 diabetes uncontrolled admitted with persistent nausea  vomiting and fever with urinary findings and CT findings consistent with pyelonephritis. Urine culture with multiple species suggested recollection but she was already on antibiotics for 3 days at that time.  Blood cultures no growth at the time of discharge.  She will be discharged on cefdinir 300 twice daily for 10 days.  She reported intolerance to Keflex with GI symptoms and abdominal discomfort.  She was advised to take cefdinir with food. CT scan of the abdomen shows no renal abscess or obstruction, bilateral multifocal pyelonephritis noted. Patient denies history of previous UTI or pyelonephritis.  #2 uncontrolled type 2 diabetes-patient takes Trulicity and Metformin at home. She will be discharged on Lantus 15 units nightly with NovoLog 5 units 3 times a day before meals.  DC Trulicity and Metformin.  She was seen in consultation by diabetic coordinator.  Her hemoglobin A1c was 13.6.  Marland Kitchen  #3 pseudohyponatremia secondary to hyperglycemia-resolved sodium 136 upon discharge.  #4 essential hypertension-continue metoprolol.  She is on Lasix at home which she takes for swelling in the lower extremities.  She reported her swelling is not too bad so I have advised her to stop taking the Lasix and the potassium for now.    #5 intractable nausea vomiting  Resolved  #6 hypokalemia potassium 3.0 repleted with 80 mEq of potassium.     #6 hypothyroidism continue Synthroid  #7 Covid positive in February 2021  Estimated body mass index is 28.32 kg/m as calculated from the following:   Height as of this encounter: 5\' 4"  (1.626 m).   Weight as of this encounter: 74.8 kg.  Discharge Instructions  Discharge Instructions    Ambulatory referral to Nutrition and Diabetic Education   Complete by: As directed  Diet - low sodium heart healthy   Complete by: As directed    Increase activity slowly   Complete by: As directed      Allergies as of 03/02/2020      Reactions   Sulfa Antibiotics  Hives   Sulfur Other (See Comments)   blisters   Trulicity [dulaglutide]    Abdominal pain   Hctz [hydrochlorothiazide] Rash   Possible photosensitivity dermatitis   Invokana [canagliflozin] Rash   Possible photosensitivity dermatitis      Medication List    STOP taking these medications   cephALEXin 500 MG capsule Commonly known as: KEFLEX   Dulaglutide 0.75 MG/0.5ML Sopn Commonly known as: Trulicity   furosemide 40 MG tablet Commonly known as: LASIX   metFORMIN 1000 MG tablet Commonly known as: GLUCOPHAGE     TAKE these medications   cefdinir 300 MG capsule Commonly known as: OMNICEF Take 1 capsule (300 mg total) by mouth 2 (two) times daily.   fluticasone 50 MCG/ACT nasal spray Commonly known as: FLONASE Place 2 sprays into both nostrils daily.   glucose blood test strip Commonly known as: ONE TOUCH ULTRA TEST USE AS DIRECTED   insulin aspart 100 UNIT/ML FlexPen Commonly known as: NOVOLOG Inject 5 Units into the skin 3 (three) times daily with meals.   insulin glargine 100 UNIT/ML injection Commonly known as: LANTUS Inject 0.15 mLs (15 Units total) into the skin at bedtime.   Insulin Pen Needle 31G X 5 MM Misc 5 Units by Does not apply route in the morning, at noon, and at bedtime.   levothyroxine 75 MCG tablet Commonly known as: SYNTHROID TAKE 1 TABLET BY MOUTH EVERY DAY   loratadine 10 MG tablet Commonly known as: CLARITIN Take 10 mg by mouth daily.   magic mouthwash Soln Take 15 mLs by mouth every 4 (four) hours as needed for mouth pain. Hydrocortisone 60 mg, nystatin 100,000u/ml 30 ml suspension, Benadryl 12.5 mg/ml QS to 240 ml   metoprolol succinate 50 MG 24 hr tablet Commonly known as: TOPROL-XL Take 1 tablet (50 mg total) by mouth 2 (two) times daily. With or immediately following a meal.   ondansetron 4 MG disintegrating tablet Commonly known as: Zofran ODT Take 1 tablet (4 mg total) by mouth every 8 (eight) hours as needed for nausea or  vomiting.   ondansetron 4 MG tablet Commonly known as: ZOFRAN Take 1 tablet (4 mg total) by mouth every 6 (six) hours as needed for nausea.   phenazopyridine 200 MG tablet Commonly known as: PYRIDIUM Take 1 tablet (200 mg total) by mouth 3 (three) times daily.   potassium chloride SA 20 MEQ tablet Commonly known as: KLOR-CON Take 1 tablet (20 mEq total) by mouth daily.   propranolol 10 MG tablet Commonly known as: INDERAL Take 10 mg by mouth 4 (four) times daily as needed (heart papatations).      Follow-up Information    Sherren MochaShaw, Eva N, MD Follow up.   Specialty: Family Medicine Contact information: [address] [place] [address] [phone]        Nahser, Deloris PingPhilip J, MD .   Specialty: Cardiology Contact information: [address] [place] [address] [phone]          Allergies  Allergen Reactions  . Sulfa Antibiotics Hives  . Sulfur Other (See Comments)    blisters  . Trulicity [Dulaglutide]     Abdominal pain  . Hctz [Hydrochlorothiazide] Rash    Possible photosensitivity dermatitis  .  Invokana [Canagliflozin] Rash    Possible photosensitivity dermatitis    Consultations: None  Procedures/Studies: CT Abdomen Pelvis W Contrast  Result Date: 02/28/2020 CLINICAL DATA:  Nausea and vomiting, bloody emesis, right upper quadrant pain EXAM: CT ABDOMEN AND PELVIS WITH CONTRAST TECHNIQUE: Multidetector CT imaging of the abdomen and pelvis was performed using the standard protocol following bolus administration of intravenous contrast. CONTRAST:  151mL OMNIPAQUE IOHEXOL 300 MG/ML  SOLN COMPARISON:  02/28/2020 FINDINGS: Lower chest: No acute pleural or parenchymal lung disease. Hepatobiliary: There is mild fatty infiltration of the liver. Minimal gallbladder sludge is identified without cholelithiasis or cholecystitis. No biliary dilation. Pancreas: Unremarkable. No pancreatic ductal dilatation or surrounding inflammatory changes. Spleen:  There is borderline enlargement the spleen measuring 13.4 cm in craniocaudal length. No focal splenic abnormalities. Adrenals/Urinary Tract: There is markedly heterogeneous enhancement of the bilateral kidneys consistent with multifocal bilateral pyelonephritis. I do not see any evidence of renal abscess at this time. No obstructive uropathy. No urinary tract calculi. The bladder is unremarkable. Adrenals are normal. Stomach/Bowel: No bowel obstruction or ileus. Normal appendix right lower quadrant. Minimal colonic diverticulosis without diverticulitis. No bowel wall thickening or inflammatory changes. Vascular/Lymphatic: No significant vascular findings are present. No enlarged abdominal or pelvic lymph nodes. Reproductive: Uterus and bilateral adnexa are unremarkable. Other: No abdominal wall hernia or abnormality. No abdominopelvic ascites. Musculoskeletal: No acute or destructive bony lesions. Reconstructed images demonstrate no additional findings. IMPRESSION: 1. Bilateral multifocal pyelonephritis without evidence of renal abscess. 2. Mild fatty infiltration of the liver. 3. Borderline splenomegaly. 4. Minimal colonic diverticulosis without diverticulitis. Electronically Signed   By: Randa Ngo M.D.   On: 02/28/2020 15:33   DG CHEST PORT 1 VIEW  Result Date: 02/28/2020 CLINICAL DATA:  Sepsis EXAM: PORTABLE CHEST 1 VIEW COMPARISON:  01/22/2020 FINDINGS: Single frontal view of the chest demonstrates a stable cardiac silhouette. No airspace disease, effusion, or pneumothorax. Lung volumes are diminished. IMPRESSION: 1. Low lung volumes, no acute process. Electronically Signed   By: Randa Ngo M.D.   On: 02/28/2020 21:02   US Abdomen Limited RUQ  Result Date: 02/28/2020 CLINICAL DATA:  Vomiting for 6 weeks. EXAM: ULTRASOUND ABDOMEN LIMITED RIGHT UPPER QUADRANT COMPARISON:  None. FINDINGS: Gallbladder: There is a small amount of sludge in the gallbladder. There is a comet tail sign in the fundus  which could represent minimal adenomyomatosis. No stones. Gallbladder wall thickness is normal. Common bile duct: Diameter: 4 mm, normal. Liver: Diffuse hepatic steatosis with focal fatty sparing adjacent to the gallbladder. No liver masses. Portal vein is patent on color Doppler imaging with normal direction of blood flow towards the liver. Other: None. IMPRESSION: 1. No acute abnormality. 2. Hepatic steatosis. Electronically Signed   By: Lorriane Shire M.D.   On: 02/28/2020 13:54    (Echo, Carotid, EGD, Colonoscopy, ERCP)    Subjective: Patient resting in bed no nausea vomiting tolerating p.o. intake  Discharge Exam: Vitals:   03/02/20 0556 03/02/20 0813  BP: (!) 142/97 (!) 152/90  Pulse: 83 83  Resp: 18   Temp: 97.9 F (36.6 C)   SpO2: 97%    Vitals:   03/01/20 1721 03/01/20 2007 03/02/20 0556 03/02/20 0813  BP:  129/89 (!) 142/97 (!) 152/90  Pulse:  93 83 83  Resp:  17 18   Temp: (!) 100.4 F (38 C) 98.9 F (37.2 C) 97.9 F (36.6 C)   TempSrc: Oral Oral    SpO2:  97% 97%   Weight:  Height:        General: Pt is alert, awake, not in acute distress Cardiovascular: RRR, S1/S2 +, no rubs, no gallops Respiratory: CTA bilaterally, no wheezing, no rhonchi Abdominal: Soft, NT, ND, bowel sounds + Extremities: no edema, no cyanosis    The results of significant diagnostics from this hospitalization (including imaging, microbiology, ancillary and laboratory) are listed below for reference.     Microbiology: Recent Results (from the past 240 hour(s))  Urine culture     Status: Abnormal   Collection Time: 02/28/20  5:20 PM   Specimen: Urine, Clean Catch  Result Value Ref Range Status   Specimen Description   Final    URINE, CLEAN CATCH Performed at [facility], [address], [place], [place] [address]    Special Requests   Final    NONE Performed at [facility], [address], [place], [place] [address]    Culture  MULTIPLE SPECIES PRESENT, SUGGEST RECOLLECTION (A)  Final   Report Status 02/29/2020 FINAL  Final  Culture, blood (routine x 2)     Status: None (Preliminary result)   Collection Time: 02/28/20  8:03 PM   Specimen: BLOOD  Result Value Ref Range Status   Specimen Description BLOOD RIGHT ANTECUBITAL  Final   Special Requests   Final    BOTTLES DRAWN AEROBIC ONLY Blood Culture adequate volume Performed at [facility], [address], [place], [place] [address]    Culture NO GROWTH 1 DAY  Final   Report Status PENDING  Incomplete  Culture, blood (routine x 2)     Status: None (Preliminary result)   Collection Time: 02/28/20  8:03 PM   Specimen: BLOOD  Result Value Ref Range Status   Specimen Description BLOOD RIGHT HAND  Final   Special Requests   Final    BOTTLES DRAWN AEROBIC ONLY Blood Culture results may not be optimal due to an inadequate volume of blood received in culture bottles Performed at [facility], [address] [place], [place] [address]    Culture NO GROWTH 1 DAY  Final   Report Status PENDING  Incomplete     Labs: BNP (last 3 results) No results for input(s): BNP in the last 8760 hours. Basic Metabolic Panel: Recent Labs  Lab 02/28/20 1140 02/28/20 2003 02/29/20 0540 03/01/20 0527 03/02/20 0552  NA 130* 134* 133* 134* 136  K 4.2 3.6 3.2* 2.9* 3.0*  CL 88* 95* 97* 103 103  CO2 25 26 23  21* 25  GLUCOSE 491* 284* 338* 134* 164*  BUN 12 9 13 11 7   CREATININE 0.90 0.78 0.81 0.50 0.48  CALCIUM 9.5 8.7* 8.2* 7.8* 8.1*  MG  --   --   --  1.9  --    Liver Function Tests: Recent Labs  Lab 02/28/20 1140  AST 16  ALT 10  ALKPHOS 171*  BILITOT 1.0  PROT 9.1*  ALBUMIN 4.1   Recent Labs  Lab 02/28/20 1140  LIPASE 23   No results for input(s): AMMONIA in the last 168 hours. CBC: Recent Labs  Lab 02/28/20 1140 02/29/20 0540 03/01/20 0527  WBC 9.7 8.2 6.3  HGB 13.4 10.6* 10.0*  HCT 42.2 34.3* 31.7*  MCV  79.5* 82.7 82.6  PLT 363 240 234   Cardiac Enzymes: No results for input(s): CKTOTAL, CKMB, CKMBINDEX, TROPONINI in the last 168 hours. BNP: Invalid input(s): POCBNP CBG: Recent Labs  Lab 03/01/20 0729 03/01/20 1127 03/01/20 1639 03/01/20  2203 03/02/20 0756  GLUCAP 106* 200* 125* 214* 160*   D-Dimer No results for input(s): DDIMER in the last 72 hours. Hgb A1c Recent Labs    02/28/20 2003  HGBA1C 13.6*   Lipid Profile No results for input(s): CHOL, HDL, LDLCALC, TRIG, CHOLHDL, LDLDIRECT in the last 72 hours. Thyroid function studies Recent Labs    02/28/20 2003  TSH 2.672   Anemia work up No results for input(s): VITAMINB12, FOLATE, FERRITIN, TIBC, IRON, RETICCTPCT in the last 72 hours. Urinalysis    Component Value Date/Time   COLORURINE STRAW (A) 02/28/2020 1219   APPEARANCEUR CLEAR 02/28/2020 1219   LABSPEC 1.016 02/28/2020 1219   PHURINE 6.0 02/28/2020 1219   GLUCOSEU >=500 (A) 02/28/2020 1219   HGBUR SMALL (A) 02/28/2020 1219   BILIRUBINUR NEGATIVE 02/28/2020 1219   BILIRUBINUR negative 08/01/2017 1732   BILIRUBINUR neg 07/30/2012 1410   KETONESUR 20 (A) 02/28/2020 1219   PROTEINUR 100 (A) 02/28/2020 1219   UROBILINOGEN 0.2 08/01/2017 1732   NITRITE NEGATIVE 02/28/2020 1219   LEUKOCYTESUR NEGATIVE 02/28/2020 1219   Sepsis Labs Invalid input(s): PROCALCITONIN,  WBC,  LACTICIDVEN Microbiology Recent Results (from the past 240 hour(s))  Urine culture     Status: Abnormal   Collection Time: 02/28/20  5:20 PM   Specimen: Urine, Clean Catch  Result Value Ref Range Status   Specimen Description   Final    URINE, CLEAN CATCH Performed at [facility], [address], [place], [place] [address]    Special Requests   Final    NONE Performed at [facility], [address], [place], [place] [address]    Culture MULTIPLE SPECIES PRESENT, SUGGEST RECOLLECTION (A)  Final   Report Status 02/29/2020 FINAL  Final   Culture, blood (routine x 2)     Status: None (Preliminary result)   Collection Time: 02/28/20  8:03 PM   Specimen: BLOOD  Result Value Ref Range Status   Specimen Description BLOOD RIGHT ANTECUBITAL  Final   Special Requests   Final    BOTTLES DRAWN AEROBIC ONLY Blood Culture adequate volume Performed at [facility], [address], [place], [place] [address]    Culture NO GROWTH 1 DAY  Final   Report Status PENDING  Incomplete  Culture, blood (routine x 2)     Status: None (Preliminary result)   Collection Time: 02/28/20  8:03 PM   Specimen: BLOOD  Result Value Ref Range Status   Specimen Description BLOOD RIGHT HAND  Final   Special Requests   Final    BOTTLES DRAWN AEROBIC ONLY Blood Culture results may not be optimal due to an inadequate volume of blood received in culture bottles Performed at [facility], [address] [place], [place] [address]    Culture NO GROWTH 1 DAY  Final   Report Status PENDING  Incomplete     Time coordinating discharge:  39 minutes  SIGNED:   Alwyn Ren, MD  [facility] 03/02/2020, 9:35 AM Pager   If 7PM-7AM, please contact night-coverage [email or web address] Password [email or web address]

## 2020-03-05 LAB — CULTURE, BLOOD (ROUTINE X 2)
Culture: NO GROWTH
Culture: NO GROWTH
Special Requests: ADEQUATE

## 2020-03-18 ENCOUNTER — Emergency Department (HOSPITAL_COMMUNITY): Payer: 59

## 2020-03-18 ENCOUNTER — Encounter (HOSPITAL_COMMUNITY): Payer: Self-pay | Admitting: Emergency Medicine

## 2020-03-18 ENCOUNTER — Inpatient Hospital Stay (HOSPITAL_COMMUNITY)
Admission: EM | Admit: 2020-03-18 | Discharge: 2020-03-22 | DRG: 872 | Disposition: A | Discharge status: Home / Self Care | Payer: 59 | Attending: Internal Medicine | Admitting: Internal Medicine

## 2020-03-18 DIAGNOSIS — Z888 Allergy status to other drugs, medicaments and biological substances status: Secondary | ICD-10-CM

## 2020-03-18 DIAGNOSIS — I10 Essential (primary) hypertension: Secondary | ICD-10-CM | POA: Diagnosis present

## 2020-03-18 DIAGNOSIS — Z79890 Hormone replacement therapy (postmenopausal): Secondary | ICD-10-CM | POA: Diagnosis not present

## 2020-03-18 DIAGNOSIS — Z8249 Family history of ischemic heart disease and other diseases of the circulatory system: Secondary | ICD-10-CM | POA: Diagnosis not present

## 2020-03-18 DIAGNOSIS — Z6829 Body mass index (BMI) 29.0-29.9, adult: Secondary | ICD-10-CM

## 2020-03-18 DIAGNOSIS — N10 Acute tubulo-interstitial nephritis: Secondary | ICD-10-CM | POA: Diagnosis present

## 2020-03-18 DIAGNOSIS — Z794 Long term (current) use of insulin: Secondary | ICD-10-CM

## 2020-03-18 DIAGNOSIS — Z7289 Other problems related to lifestyle: Secondary | ICD-10-CM

## 2020-03-18 DIAGNOSIS — E039 Hypothyroidism, unspecified: Secondary | ICD-10-CM | POA: Diagnosis present

## 2020-03-18 DIAGNOSIS — K219 Gastro-esophageal reflux disease without esophagitis: Secondary | ICD-10-CM | POA: Diagnosis present

## 2020-03-18 DIAGNOSIS — Z882 Allergy status to sulfonamides status: Secondary | ICD-10-CM | POA: Diagnosis not present

## 2020-03-18 DIAGNOSIS — R112 Nausea with vomiting, unspecified: Secondary | ICD-10-CM | POA: Diagnosis not present

## 2020-03-18 DIAGNOSIS — Z8616 Personal history of COVID-19: Secondary | ICD-10-CM | POA: Diagnosis not present

## 2020-03-18 DIAGNOSIS — I471 Supraventricular tachycardia: Secondary | ICD-10-CM | POA: Diagnosis present

## 2020-03-18 DIAGNOSIS — Z8349 Family history of other endocrine, nutritional and metabolic diseases: Secondary | ICD-10-CM | POA: Diagnosis not present

## 2020-03-18 DIAGNOSIS — E785 Hyperlipidemia, unspecified: Secondary | ICD-10-CM | POA: Diagnosis present

## 2020-03-18 DIAGNOSIS — E669 Obesity, unspecified: Secondary | ICD-10-CM | POA: Diagnosis present

## 2020-03-18 DIAGNOSIS — N12 Tubulo-interstitial nephritis, not specified as acute or chronic: Secondary | ICD-10-CM

## 2020-03-18 DIAGNOSIS — E1165 Type 2 diabetes mellitus with hyperglycemia: Secondary | ICD-10-CM | POA: Diagnosis present

## 2020-03-18 DIAGNOSIS — B9620 Unspecified Escherichia coli [E. coli] as the cause of diseases classified elsewhere: Secondary | ICD-10-CM | POA: Diagnosis present

## 2020-03-18 DIAGNOSIS — Z87891 Personal history of nicotine dependence: Secondary | ICD-10-CM | POA: Diagnosis not present

## 2020-03-18 DIAGNOSIS — Z79899 Other long term (current) drug therapy: Secondary | ICD-10-CM | POA: Diagnosis not present

## 2020-03-18 DIAGNOSIS — R7881 Bacteremia: Secondary | ICD-10-CM | POA: Diagnosis present

## 2020-03-18 LAB — URINALYSIS, ROUTINE W REFLEX MICROSCOPIC
Bilirubin Urine: NEGATIVE
Glucose, UA: 150 mg/dL — AB
Ketones, ur: 5 mg/dL — AB
Nitrite: NEGATIVE
Protein, ur: 30 mg/dL — AB
Specific Gravity, Urine: 1.009 (ref 1.005–1.030)
pH: 7 (ref 5.0–8.0)

## 2020-03-18 LAB — I-STAT BETA HCG BLOOD, ED (MC, WL, AP ONLY): I-stat hCG, quantitative: <5 m[IU]/mL (ref <5)

## 2020-03-18 LAB — COMPREHENSIVE METABOLIC PANEL
ALT: 14 U/L (ref 0–44)
AST: 23 U/L (ref 15–41)
Albumin: 3.8 g/dL (ref 3.5–5.0)
Alkaline Phosphatase: 144 U/L — ABNORMAL HIGH (ref 38–126)
Anion gap: 11 (ref 5–15)
BUN: 11 mg/dL (ref 6–20)
CO2: 26 mmol/L (ref 22–32)
Calcium: 9.2 mg/dL (ref 8.9–10.3)
Chloride: 99 mmol/L (ref 98–111)
Creatinine, Ser: 0.68 mg/dL (ref 0.44–1.00)
GFR calc Af Amer: >60 mL/min (ref >60)
GFR calc non Af Amer: >60 mL/min (ref >60)
Glucose, Bld: 238 mg/dL — ABNORMAL HIGH (ref 70–99)
Potassium: 4.3 mmol/L (ref 3.5–5.1)
Sodium: 136 mmol/L (ref 135–145)
Total Bilirubin: 0.9 mg/dL (ref 0.3–1.2)
Total Protein: 8.2 g/dL — ABNORMAL HIGH (ref 6.5–8.1)

## 2020-03-18 LAB — CBC
HCT: 32.1 % — ABNORMAL LOW (ref 36.0–46.0)
Hemoglobin: 9.9 g/dL — ABNORMAL LOW (ref 12.0–15.0)
MCH: 24.9 pg — ABNORMAL LOW (ref 26.0–34.0)
MCHC: 30.8 g/dL (ref 30.0–36.0)
MCV: 80.9 fL (ref 80.0–100.0)
Platelets: 243 10*3/uL (ref 150–400)
RBC: 3.97 MIL/uL (ref 3.87–5.11)
RDW: 13.8 % (ref 11.5–15.5)
WBC: 8.3 10*3/uL (ref 4.0–10.5)
nRBC: 0 % (ref 0.0–0.2)

## 2020-03-18 LAB — PROTIME-INR
INR: 1.1 (ref 0.8–1.2)
Prothrombin Time: 13.7 seconds (ref 11.4–15.2)

## 2020-03-18 LAB — LACTIC ACID, PLASMA
Lactic Acid, Venous: 1.7 mmol/L (ref 0.5–1.9)
Lactic Acid, Venous: 1 mmol/L (ref 0.5–1.9)

## 2020-03-18 LAB — APTT: aPTT: 32 seconds (ref 24–36)

## 2020-03-18 LAB — LIPASE, BLOOD: Lipase: 20 U/L (ref 11–51)

## 2020-03-18 MED ORDER — SODIUM CHLORIDE 0.9 % IV BOLUS
1000.0000 mL | Freq: Once | INTRAVENOUS | Status: AC
  Administered 2020-03-18: 1000 mL via INTRAVENOUS

## 2020-03-18 MED ORDER — ACETAMINOPHEN 650 MG RE SUPP: 650.0000 mg | Freq: Four times a day (QID) | RECTAL | Status: DC | PRN

## 2020-03-18 MED ORDER — ONDANSETRON HCL 4 MG PO TABS
4.0000 mg | ORAL_TABLET | Freq: Four times a day (QID) | ORAL | Status: DC | PRN
  Administered 2020-03-19 – 2020-03-20 (×2): 4 mg via ORAL
  Filled 2020-03-18 (×2): qty 1

## 2020-03-18 MED ORDER — MORPHINE SULFATE (PF) 2 MG/ML IV SOLN: 2.0000 mg | INTRAVENOUS | Status: DC | PRN

## 2020-03-18 MED ORDER — ONDANSETRON HCL 4 MG/2ML IJ SOLN
4.0000 mg | Freq: Four times a day (QID) | INTRAMUSCULAR | Status: DC | PRN
  Administered 2020-03-19 (×3): 4 mg via INTRAVENOUS
  Filled 2020-03-18 (×3): qty 2

## 2020-03-18 MED ORDER — LORATADINE 10 MG PO TABS
10.0000 mg | ORAL_TABLET | Freq: Every day | ORAL | Status: DC
  Administered 2020-03-19 – 2020-03-22 (×4): 10 mg via ORAL
  Filled 2020-03-18 (×4): qty 1

## 2020-03-18 MED ORDER — SODIUM CHLORIDE 0.9 % IV SOLN
1.0000 g | Freq: Three times a day (TID) | INTRAVENOUS | Status: DC
  Administered 2020-03-19 – 2020-03-22 (×11): 1 g via INTRAVENOUS
  Filled 2020-03-18 (×12): qty 1

## 2020-03-18 MED ORDER — ENOXAPARIN SODIUM 40 MG/0.4ML ~~LOC~~ SOLN
40.0000 mg | Freq: Every day | SUBCUTANEOUS | Status: DC
  Administered 2020-03-19 – 2020-03-21 (×3): 40 mg via SUBCUTANEOUS
  Filled 2020-03-18 (×4): qty 0.4

## 2020-03-18 MED ORDER — SODIUM CHLORIDE 0.9 % IV SOLN: INTRAVENOUS | Status: DC

## 2020-03-18 MED ORDER — IOHEXOL 300 MG/ML  SOLN
100.0000 mL | Freq: Once | INTRAMUSCULAR | Status: AC | PRN
  Administered 2020-03-18: 100 mL via INTRAVENOUS

## 2020-03-18 MED ORDER — ONDANSETRON HCL 4 MG/2ML IJ SOLN
4.0000 mg | Freq: Once | INTRAMUSCULAR | Status: AC
  Administered 2020-03-18: 4 mg via INTRAVENOUS
  Filled 2020-03-18: qty 2

## 2020-03-18 MED ORDER — ACETAMINOPHEN 325 MG PO TABS
650.0000 mg | ORAL_TABLET | Freq: Four times a day (QID) | ORAL | Status: DC | PRN
  Administered 2020-03-19 – 2020-03-22 (×12): 650 mg via ORAL
  Filled 2020-03-18 (×12): qty 2

## 2020-03-18 MED ORDER — METOPROLOL TARTRATE 50 MG PO TABS
50.0000 mg | ORAL_TABLET | Freq: Two times a day (BID) | ORAL | Status: DC
  Administered 2020-03-19 – 2020-03-22 (×7): 50 mg via ORAL
  Filled 2020-03-18: qty 4
  Filled 2020-03-18 (×3): qty 2
  Filled 2020-03-18: qty 4
  Filled 2020-03-18 (×2): qty 2

## 2020-03-18 MED ORDER — SODIUM CHLORIDE 0.9 % IV SOLN
1.0000 g | INTRAVENOUS | Status: DC
  Administered 2020-03-18: 1 g via INTRAVENOUS
  Filled 2020-03-18: qty 10

## 2020-03-18 MED ORDER — ACETAMINOPHEN 325 MG PO TABS
650.0000 mg | ORAL_TABLET | Freq: Once | ORAL | Status: AC
  Administered 2020-03-18: 650 mg via ORAL
  Filled 2020-03-18: qty 2

## 2020-03-18 MED ORDER — METOPROLOL TARTRATE 25 MG PO TABS: 50.0000 mg | ORAL_TABLET | Freq: Two times a day (BID) | ORAL | Status: DC

## 2020-03-18 MED ORDER — INSULIN ASPART 100 UNIT/ML ~~LOC~~ SOLN
0.0000 [IU] | SUBCUTANEOUS | Status: DC
  Administered 2020-03-19 (×2): 3 [IU] via SUBCUTANEOUS
  Administered 2020-03-19: 5 [IU] via SUBCUTANEOUS
  Administered 2020-03-19: 3 [IU] via SUBCUTANEOUS
  Administered 2020-03-19 (×2): 2 [IU] via SUBCUTANEOUS
  Administered 2020-03-20: 1 [IU] via SUBCUTANEOUS
  Administered 2020-03-20 (×3): 2 [IU] via SUBCUTANEOUS
  Administered 2020-03-20: 1 [IU] via SUBCUTANEOUS
  Administered 2020-03-21 (×2): 3 [IU] via SUBCUTANEOUS
  Administered 2020-03-21: 2 [IU] via SUBCUTANEOUS
  Administered 2020-03-21: 3 [IU] via SUBCUTANEOUS
  Administered 2020-03-21: 1 [IU] via SUBCUTANEOUS
  Administered 2020-03-22: 3 [IU] via SUBCUTANEOUS
  Administered 2020-03-22: 1 [IU] via SUBCUTANEOUS
  Administered 2020-03-22: 2 [IU] via SUBCUTANEOUS
  Administered 2020-03-22: 1 [IU] via SUBCUTANEOUS
  Filled 2020-03-18: qty 0.09

## 2020-03-18 MED ORDER — INSULIN GLARGINE 100 UNIT/ML ~~LOC~~ SOLN
10.0000 [IU] | Freq: Every day | SUBCUTANEOUS | Status: DC
  Administered 2020-03-19: 10 [IU] via SUBCUTANEOUS
  Filled 2020-03-18 (×2): qty 0.1

## 2020-03-18 MED ORDER — LEVOTHYROXINE SODIUM 75 MCG PO TABS
75.0000 ug | ORAL_TABLET | Freq: Every day | ORAL | Status: DC
  Administered 2020-03-19 – 2020-03-22 (×4): 75 ug via ORAL
  Filled 2020-03-18 (×4): qty 1

## 2020-03-18 NOTE — ED Notes (Signed)
Patient requesting tylenol. PA made aware.

## 2020-03-18 NOTE — ED Triage Notes (Signed)
Per pt, states she was here 3 days ago with a kidney infection-states she followed up with PCP and she was diagnosed with UTI-started vomiting this morning-unable to keep anything down-states increased back pain

## 2020-03-18 NOTE — Progress Notes (Signed)
Pharmacy Antibiotic Note  Julia [name] is a 49 y.o. female admitted on 03/18/2020 with UTI.  Pharmacy has been consulted for meropenem dosing.  Plan: Meropenem 1 Gm IV q8h F/u scr/cultures     Temp (24hrs), Avg:99.3 F (37.4 C), Min:98.5 F (36.9 C), Max:100.1 F (37.8 C)  Recent Labs  Lab 03/18/20 1928 03/18/20 2021 03/18/20 2111 03/18/20 2205  WBC  --   --  8.3  --   CREATININE 0.68  --   --   --   LATICACIDVEN  --  1.7  --  1.0    CrCl cannot be calculated (Unknown ideal weight.).    Allergies  Allergen Reactions  . Sulfa Antibiotics Hives  . Sulfur Other (See Comments)    blisters  . Trulicity [Dulaglutide]     Abdominal pain  . Hctz [Hydrochlorothiazide] Rash    Possible photosensitivity dermatitis  . Invokana [Canagliflozin] Rash    Possible photosensitivity dermatitis    Antimicrobials this admission: 4/15 meropenem >>    >>   Dose adjustments this admission:   Microbiology results:  BCx:   UCx:    Sputum:    MRSA PCR:   Thank you for allowing pharmacy to be a part of this patient's care.  Lorenza Evangelist 03/18/2020 11:50 PM

## 2020-03-18 NOTE — ED Notes (Signed)
Patient ambulatory to CT

## 2020-03-18 NOTE — ED Provider Notes (Signed)
[facility] DEPT Provider Note   CSN: [id number] Arrival date & time: 03/18/20  1738     History Chief Complaint  Patient presents with  . Emesis    Julia [name] is a 49 y.o. female with a past medical history significant for GERD, hypertension, uncontrolled type 2 diabetes, and hyperlipidemia who presents to the ED due to emesis, back pain, and dysuria for the past few days.  Patient states she was diagnosed with a UTI on Tuesday at her PCP office.  On Tuesday she was experiencing dysuria.  Today she developed worsening bilateral low back pain associated with 7 episodes of nonbloody, nonbilious emesis.  She admits to intermittent fever throughout the day today.  T-max 101 F at 9 AM this morning.  Patient was prescribed Macrobid at her PCP for her UTI which she has been compliant with.  Chart reviewed.  Patient was recently admitted to the hospital on 3/27-3/30 pyelonephritis and was discharged with cefdinir 300 mg twice daily for 10 days which she notes she finished.  Patient denies vaginal symptoms.  Notes she was last sexually active 3 months ago.  Denies chest pain and shortness of breath.  Denies abdominal pain and diarrhea.  History obtained from patient and past medical records. No interpreter used during encounter.    Past Medical History:  Diagnosis Date  . Complication of anesthesia    I had a reaction to a epidural "broke out"  . Condyloma acuminatum of vulva   . COVID-19   . GERD (gastroesophageal reflux disease)   . History of supraventricular tachycardia   . HSV-1 infection   . HSV-2 infection   . HTN (hypertension)   . Hyperlipidemia   . Type 2 diabetes mellitus [facility])     Patient Active Problem List   Diagnosis Date Noted  . Pyelonephritis 02/28/2020  . Intractable nausea and vomiting 02/28/2020  . Nasal sinus congestion 03/15/2018  . Subclinical hypothyroidism 08/05/2017  . Hyperlipidemia 07/28/2016  . Type 2 diabetes mellitus  with hyperglycemia, without long-term current use of insulin ([place]) 04/15/2016  . Sinus tachycardia 11/12/2015  . S/P radiofrequency ablation operation for arrhythmia 02/24/2015  . SVT (supraventricular tachycardia) ([place]) 08/13/2014  . Palpitations 08/13/2014  . Obesity, Class I, BMI 30-34.9 09/30/2013  . HTN (hypertension) 08/01/2012    Past Surgical History:  Procedure Laterality Date  . CARDIOVASCULAR STRESS TEST  06-04-2007   normal nuclear study/  no ischemia/  normal LVF, ef 63%  . CESAREAN SECTION  09-06-2005  . LASER ABLATION CONDOLAMATA N/A 10/08/2015   Procedure: CO2 LASER ABLATION CONDOLAMATA OF VULVA;  Surgeon: Molli Posey, MD;  Location: [facility];  Service: Gynecology;  Laterality: N/A;  . SUPRAVENTRICULAR TACHYCARDIA ABLATION N/A 02/24/2015   Procedure: SUPRAVENTRICULAR TACHYCARDIA ABLATION;  Surgeon: Evans Lance, MD;  Location: [facility] CATH LAB;  Service: Cardiovascular;  Laterality: N/A;  . TRANSTHORACIC ECHOCARDIOGRAM  01-06-2009   normal LVF, ef 55-60%,  trace MR and TR/  normal stress echo     OB History   No obstetric history on file.     Family History  Problem Relation Age of Onset  . Hypertension Mother   . Thyroid disease Mother   . Diabetes Mother   . Thyroid disease Sister   . Diabetes Maternal Grandmother   . Hyperlipidemia Other   . Thyroid disease Other     Social History   Tobacco Use  . Smoking status: Former Smoker    Years: 8.00  Types: Cigarettes    Quit date: 12/16/2002    Years since quitting: 17.2  . Smokeless tobacco: Never Used  Substance Use Topics  . Alcohol use: Yes    Alcohol/week: 2.0 standard drinks    Types: 2 Standard drinks or equivalent per week    Comment: rare  . Drug use: No    Home Medications Prior to Admission medications   Medication Sig Start Date End Date Taking? Authorizing Provider  insulin aspart (NOVOLOG) 100 UNIT/ML FlexPen Inject 5 Units into the skin 3 (three) times daily with  meals. 03/02/20  Yes Georgette Shell, MD  Insulin Glargine [facility]) 100 UNIT/ML Inject 15 Units into the skin at bedtime.  03/04/20  Yes [provider]  ipratropium (ATROVENT) 0.06 % nasal spray Place 2 sprays into both nostrils daily as needed for congestion. 02/22/20  Yes [provider]  levothyroxine (SYNTHROID, LEVOTHROID) 75 MCG tablet TAKE 1 TABLET BY MOUTH EVERY DAY Patient taking differently: Take 75 mcg by mouth daily before breakfast.  11/01/18  Yes Shawnee Knapp, MD  loratadine (CLARITIN) 10 MG tablet Take 10 mg by mouth daily.   Yes [provider]  metoprolol tartrate (LOPRESSOR) 50 MG tablet Take 50 mg by mouth 2 (two) times daily. 03/16/20  Yes [provider]  nitrofurantoin, macrocrystal-monohydrate, (MACROBID) 100 MG capsule Take 100 mg by mouth 2 (two) times daily. 03/17/20  Yes [provider]  ondansetron (ZOFRAN) 4 MG tablet Take 1 tablet (4 mg total) by mouth every 6 (six) hours as needed for nausea. 03/02/20  Yes Georgette Shell, MD  potassium chloride SA (K-DUR,KLOR-CON) 20 MEQ tablet Take 1 tablet (20 mEq total) by mouth daily. 03/15/18  Yes Shawnee Knapp, MD  propranolol (INDERAL) 10 MG tablet Take 10 mg by mouth 4 (four) times daily as needed (heart papatations).    Yes [provider]  cefdinir (OMNICEF) 300 MG capsule Take 1 capsule (300 mg total) by mouth 2 (two) times daily. Patient not taking: Reported on 03/18/2020 03/02/20   Georgette Shell, MD  glucose blood (ONE TOUCH ULTRA TEST) test strip USE AS DIRECTED 10/04/17   Philemon Kingdom, MD  Insulin Pen Needle 31G X 5 MM MISC 5 Units by Does not apply route in the morning, at noon, and at bedtime. 03/02/20   Georgette Shell, MD  magic mouthwash SOLN Take 15 mLs by mouth every 4 (four) hours as needed for mouth pain. Hydrocortisone 60 mg, nystatin 100,000u/ml 30 ml suspension, Benadryl 12.5 mg/ml QS to 240 ml Patient not taking: Reported on  03/18/2020 02/16/20   Loura Halt A, NP  metoprolol succinate (TOPROL-XL) 50 MG 24 hr tablet Take 1 tablet (50 mg total) by mouth 2 (two) times daily. With or immediately following a meal. Patient not taking: Reported on 03/18/2020 12/24/19   Nahser, Wonda Cheng, MD  ondansetron (ZOFRAN ODT) 4 MG disintegrating tablet Take 1 tablet (4 mg total) by mouth every 8 (eight) hours as needed for nausea or vomiting. Patient not taking: Reported on 03/18/2020 01/22/20   Tedd Sias, PA  phenazopyridine (PYRIDIUM) 200 MG tablet Take 1 tablet (200 mg total) by mouth 3 (three) times daily. Patient not taking: Reported on 03/18/2020 01/22/20   Tedd Sias, PA    Allergies    Sulfa antibiotics, Sulfur, Trulicity [dulaglutide], Hctz [hydrochlorothiazide], and Invokana [canagliflozin]  Review of Systems   Review of Systems  Constitutional: Positive for chills and fever.  Respiratory: Negative for shortness  of breath.   Cardiovascular: Negative for chest pain.  Gastrointestinal: Positive for nausea and vomiting. Negative for abdominal pain and diarrhea.  Genitourinary: Positive for dysuria. Negative for vaginal discharge.  Musculoskeletal: Positive for back pain.  All other systems reviewed and are negative.   Physical Exam Updated Vital Signs BP 104/62   Pulse (!) 108   Temp 100.1 F (37.8 C) (Oral)   Resp 12   LMP 07/22/2019   SpO2 96%   Physical Exam Vitals and nursing note reviewed.  Constitutional:      General: She is not in acute distress.    Appearance: She is not toxic-appearing.  HENT:     Head: Normocephalic.  Eyes:     Pupils: Pupils are equal, round, and reactive to light.  Cardiovascular:     Rate and Rhythm: Normal rate and regular rhythm.     Pulses: Normal pulses.     Heart sounds: Normal heart sounds. No murmur. No friction rub. No gallop.   Pulmonary:     Effort: Pulmonary effort is normal.     Breath sounds: Normal breath sounds.  Abdominal:     General: Abdomen is  flat. Bowel sounds are normal. There is no distension.     Palpations: Abdomen is soft.     Tenderness: There is no abdominal tenderness. There is right CVA tenderness and left CVA tenderness. There is no guarding or rebound.     Comments: Bilateral CVA tenderness.  Musculoskeletal:     Cervical back: Neck supple.     Comments: Able to move all 4 extremities without difficulty.  Skin:    General: Skin is warm and dry.  Neurological:     General: No focal deficit present.     Mental Status: She is alert.  Psychiatric:        Mood and Affect: Mood normal.        Behavior: Behavior normal.     ED Results / Procedures / Treatments   Labs (all labs ordered are listed, but only abnormal results are displayed) Labs Reviewed  COMPREHENSIVE METABOLIC PANEL - Abnormal; Notable for the following components:      Result Value   Glucose, Bld 238 (*)    Total Protein 8.2 (*)    Alkaline Phosphatase 144 (*)    All other components within normal limits  URINALYSIS, ROUTINE W REFLEX MICROSCOPIC - Abnormal; Notable for the following components:   Color, Urine STRAW (*)    Glucose, UA 150 (*)    Hgb urine dipstick SMALL (*)    Ketones, ur 5 (*)    Protein, ur 30 (*)    Leukocytes,Ua TRACE (*)    Bacteria, UA RARE (*)    All other components within normal limits  CBC - Abnormal; Notable for the following components:   Hemoglobin 9.9 (*)    HCT 32.1 (*)    MCH 24.9 (*)    All other components within normal limits  CULTURE, BLOOD (ROUTINE X 2)  CULTURE, BLOOD (ROUTINE X 2)  URINE CULTURE  LIPASE, BLOOD  LACTIC ACID, PLASMA  LACTIC ACID, PLASMA  APTT  PROTIME-INR  I-STAT BETA HCG BLOOD, ED ([facility], [facility], [facility] ONLY)    EKG EKG Interpretation  Date/Time:  Thursday March 18 2020 20:22:23 EDT Ventricular Rate:  117 PR Interval:    QRS Duration: 82 QT Interval:  325 QTC Calculation: 454 R Axis:   13 Text Interpretation: Sinus tachycardia Low voltage, precordial leads Confirmed by Gerlene Fee [phone])  on 03/18/2020 10:21:58 PM   Radiology CT ABDOMEN PELVIS W CONTRAST  Result Date: 03/18/2020 CLINICAL DATA:  Vomiting. Recent UTI EXAM: CT ABDOMEN AND PELVIS WITH CONTRAST TECHNIQUE: Multidetector CT imaging of the abdomen and pelvis was performed using the standard protocol following bolus administration of intravenous contrast. CONTRAST:  144m OMNIPAQUE IOHEXOL 300 MG/ML  SOLN COMPARISON:  02/28/2019 FINDINGS: LOWER CHEST: Normal. HEPATOBILIARY: Normal hepatic contours. No intra- or extrahepatic biliary dilatation. The gallbladder is normal. PANCREAS: Normal pancreas. No ductal dilatation or peripancreatic fluid collection. SPLEEN: Normal. ADRENALS/URINARY TRACT: The adrenal glands are normal. Bilateral striated nephrograms with hypoattenuation of the upper renal poles. No hydronephrosis. The urinary bladder is normal for degree of distention STOMACH/BOWEL: There is no hiatal hernia. Normal duodenal course and caliber. No small bowel dilatation or inflammation. No focal colonic abnormality. Normal appendix. VASCULAR/LYMPHATIC: Normal course and caliber of the major abdominal vessels. No abdominal or pelvic lymphadenopathy. REPRODUCTIVE: Normal uterus and ovaries. MUSCULOSKELETAL. No bony spinal canal stenosis or focal osseous abnormality. OTHER: None. IMPRESSION: Bilateral striated nephrograms with hypoattenuation of the upper renal poles, compatible with acute pyelonephritis. Electronically Signed   By: KUlyses JarredM.D.   On: 03/18/2020 21:31    Procedures Procedures (including critical care time)  Medications Ordered in ED Medications  cefTRIAXone (ROCEPHIN) 1 g in sodium chloride 0.9 % 100 mL IVPB (0 g Intravenous Stopped 03/18/20 2126)  0.9 %  sodium chloride infusion (has no administration in time range)  ondansetron (ZOFRAN) injection 4 mg (4 mg Intravenous Given 03/18/20 1930)  sodium chloride 0.9 % bolus 1,000 mL (0 mLs Intravenous Stopped 03/18/20 2041)  acetaminophen  (TYLENOL) tablet 650 mg (650 mg Oral Given 03/18/20 2014)  sodium chloride 0.9 % bolus 1,000 mL (1,000 mLs Intravenous New Bag/Given (Non-Interop) 03/18/20 2132)  iohexol (OMNIPAQUE) 300 MG/ML solution 100 mL (100 mLs Intravenous Contrast Given 03/18/20 2111)    ED Course  I have reviewed the triage vital signs and the nursing notes.  Pertinent labs & imaging results that were available during my care of the patient were reviewed by me and considered in my medical decision making (see chart for details).  Clinical Course as of Mar 19 2327  Thu Mar 18, 2020  2011 Informed by nurse that patient is beginning to develop a fever. Code sepsis initiated. IV fluids already given. Will get blood cultures and lactic acid. Will start IV abx   [CA]  2101 Glucose(!): 238 [CA]  2114 Lactic Acid, Venous: 1.7 [CA]  2200 Leukocytes,Ua(!): TRACE [CA]  2200 Bacteria, UA(!): RARE [CA]  2250 Spoke to Dr. SGraylon Goodwith urology who agrees to follow patient during admission   [CA]    Clinical Course User Index [CA] ASuzy Bouchard PA-C   MDM Rules/Calculators/A&P                     49year old female presents to the ED due to back pain, fever, numerous episodes of nonbloody, nonbilious emesis that started today.  Patient was recently diagnosed with a UTI at her PCP office on Tuesday and prescribed Macrobid which she has been compliant with.  Patient was recently hospitalized on 3/27-3/30 for pyelonephritis.  Upon arrival, patient afebrile, not tachycardic or hypoxic.  No concern for sepsis at this time.  Physical exam significant for bilateral CVA tenderness.  Abdomen soft, nondistended, nontender.  Will obtain routine labs and UA to rule out infection and electrolyte abnormalities. Will give IV Zofran and IVFs.  Lactic acid normal at 1.7.  CMP significant for hyperglycemia at 238 and elevated alk phos. no right upper quadrant tenderness. CBC reassuring with no leukocytosis.  CT abdomen personally reviewed  which demonstrates: IMPRESSION:  Bilateral striated nephrograms with hypoattenuation of the upper  renal poles, compatible with acute pyelonephritis.   During patient's ED stay she became febrile and code sepsis was called. See note above. IVFs and IV abx given to patient. Given patient has had recurrent pyelonephritis, will consult urology to follow patient during admission. Spoke to Dr. Graylon Good with urology. See note above. Will consult hospitalist for admission. Discussed case with Dr. Sedonia Small who evaluated patient at bedside and agrees with assessment and plan.   Discussed case with Dr. [name] who agrees to admit patient for further treatment. No COVID test placed because patient has a positive test in February.  Final Clinical Impression(s) / ED Diagnoses Final diagnoses:  Pyelonephritis    Rx / DC Orders ED Discharge Orders    None       Julia [name] 03/18/20 2338    Maudie Flakes, MD 03/19/20 [phone]

## 2020-03-18 NOTE — H&P (Signed)
History and Physical    Julia [name] IRC:[id number] DOB: Aug 09, 1971 DOA: 03/18/2020  PCP: Shawnee Knapp, MD  Patient coming from: Home  I have personally briefly reviewed patient's old medical records in [place]  Chief Complaint: Flank pain, N/V  HPI: Julia [name] is a 49 y.o. female with medical history significant of HTN, DM2, had COVID in Feb.  Pt admitted with pyelonephritis at end of march, treated with rocephin and discharged on omnicef.  Pt dx with UTI 3 days ago again, N/V onset this morning, unable to keep anything down.  Developed increased B back / flank pain.  Fever up to 101.x at home today, in to ED.  Was on macrobid by PCP.   ED Course: CT scan demonstrates B pyelonephritis findings.  Given dose of rocephin, given 2L IVF bolus.  Urology recd ID consult.   Review of Systems: As per HPI, otherwise all review of systems negative.  Past Medical History:  Diagnosis Date  . Complication of anesthesia    I had a reaction to a epidural "broke out"  . Condyloma acuminatum of vulva   . COVID-19   . GERD (gastroesophageal reflux disease)   . History of supraventricular tachycardia   . HSV-1 infection   . HSV-2 infection   . HTN (hypertension)   . Hyperlipidemia   . Type 2 diabetes mellitus ([place])     Past Surgical History:  Procedure Laterality Date  . CARDIOVASCULAR STRESS TEST  06-04-2007   normal nuclear study/  no ischemia/  normal LVF, ef 63%  . CESAREAN SECTION  09-06-2005  . LASER ABLATION CONDOLAMATA N/A 10/08/2015   Procedure: CO2 LASER ABLATION CONDOLAMATA OF VULVA;  Surgeon: Molli Posey, MD;  Location: [facility];  Service: Gynecology;  Laterality: N/A;  . SUPRAVENTRICULAR TACHYCARDIA ABLATION N/A 02/24/2015   Procedure: SUPRAVENTRICULAR TACHYCARDIA ABLATION;  Surgeon: Evans Lance, MD;  Location: [facility] CATH LAB;  Service: Cardiovascular;  Laterality: N/A;  . TRANSTHORACIC ECHOCARDIOGRAM  01-06-2009   normal LVF, ef 55-60%,   trace MR and TR/  normal stress echo     reports that she quit smoking about 17 years ago. Her smoking use included cigarettes. She quit after 8.00 years of use. She has never used smokeless tobacco. She reports current alcohol use of about 2.0 standard drinks of alcohol per week. She reports that she does not use drugs.  Allergies  Allergen Reactions  . Sulfa Antibiotics Hives  . Sulfur Other (See Comments)    blisters  . Trulicity [Dulaglutide]     Abdominal pain  . Hctz [Hydrochlorothiazide] Rash    Possible photosensitivity dermatitis  . Invokana [Canagliflozin] Rash    Possible photosensitivity dermatitis    Family History  Problem Relation Age of Onset  . Hypertension Mother   . Thyroid disease Mother   . Diabetes Mother   . Thyroid disease Sister   . Diabetes Maternal Grandmother   . Hyperlipidemia Other   . Thyroid disease Other      Prior to Admission medications   Medication Sig Start Date End Date Taking? Authorizing Provider  insulin aspart (NOVOLOG) 100 UNIT/ML FlexPen Inject 5 Units into the skin 3 (three) times daily with meals. 03/02/20  Yes Georgette Shell, MD  Insulin Glargine [facility]) 100 UNIT/ML Inject 15 Units into the skin at bedtime.  03/04/20  Yes [provider]  ipratropium (ATROVENT) 0.06 % nasal spray Place 2 sprays into both nostrils daily as needed for congestion.  02/22/20  Yes [provider]  levothyroxine (SYNTHROID, LEVOTHROID) 75 MCG tablet TAKE 1 TABLET BY MOUTH EVERY DAY Patient taking differently: Take 75 mcg by mouth daily before breakfast.  11/01/18  Yes Sherren Mocha, MD  loratadine (CLARITIN) 10 MG tablet Take 10 mg by mouth daily.   Yes [provider]  metoprolol tartrate (LOPRESSOR) 50 MG tablet Take 50 mg by mouth 2 (two) times daily. 03/16/20  Yes [provider]  nitrofurantoin, macrocrystal-monohydrate, (MACROBID) 100 MG capsule Take 100 mg by mouth 2 (two) times daily. 03/17/20  Yes  [provider]  ondansetron (ZOFRAN) 4 MG tablet Take 1 tablet (4 mg total) by mouth every 6 (six) hours as needed for nausea. 03/02/20  Yes Alwyn Ren, MD  potassium chloride SA (K-DUR,KLOR-CON) 20 MEQ tablet Take 1 tablet (20 mEq total) by mouth daily. 03/15/18  Yes Sherren Mocha, MD  propranolol (INDERAL) 10 MG tablet Take 10 mg by mouth 4 (four) times daily as needed (heart papatations).    Yes [provider]  glucose blood (ONE TOUCH ULTRA TEST) test strip USE AS DIRECTED 10/04/17   Carlus Pavlov, MD  Insulin Pen Needle 31G X 5 MM MISC 5 Units by Does not apply route in the morning, at noon, and at bedtime. 03/02/20   Alwyn Ren, MD    Physical Exam: Vitals:   03/18/20 2245 03/18/20 2300 03/18/20 2315 03/18/20 2330  BP: 104/62 109/67 121/77 117/67  Pulse: (!) 108 (!) 107 (!) 106 (!) 109  Resp: 12 18 19 17   Temp:      TempSrc:      SpO2: 96% 91% 95% 97%    Constitutional: NAD, calm, comfortable Eyes: PERRL, lids and conjunctivae normal ENMT: Mucous membranes are moist. Posterior pharynx clear of any exudate or lesions.Normal dentition.  Neck: normal, supple, no masses, no thyromegaly Respiratory: clear to auscultation bilaterally, no wheezing, no crackles. Normal respiratory effort. No accessory muscle use.  Cardiovascular: Regular rate and rhythm, no murmurs / rubs / gallops. No extremity edema. 2+ pedal pulses. No carotid bruits.  Abdomen: no tenderness, no masses palpated. No hepatosplenomegaly. Bowel sounds positive.  Musculoskeletal: no clubbing / cyanosis. No joint deformity upper and lower extremities. Good ROM, no contractures. Normal muscle tone.  Skin: no rashes, lesions, ulcers. No induration Neurologic: CN 2-12 grossly intact. Sensation intact, DTR normal. Strength 5/5 in all 4.  Psychiatric: Normal judgment and insight. Alert and oriented x 3. Normal mood.    Labs on Admission: I have personally reviewed following labs and imaging  studies  CBC: Recent Labs  Lab 03/18/20 2111  WBC 8.3  HGB 9.9*  HCT 32.1*  MCV 80.9  PLT 243   Basic Metabolic Panel: Recent Labs  Lab 03/18/20 1928  NA 136  K 4.3  CL 99  CO2 26  GLUCOSE 238*  BUN 11  CREATININE 0.68  CALCIUM 9.2   GFR: CrCl cannot be calculated (Unknown ideal weight.). Liver Function Tests: Recent Labs  Lab 03/18/20 1928  AST 23  ALT 14  ALKPHOS 144*  BILITOT 0.9  PROT 8.2*  ALBUMIN 3.8   Recent Labs  Lab 03/18/20 1928  LIPASE 20   No results for input(s): AMMONIA in the last 168 hours. Coagulation Profile: Recent Labs  Lab 03/18/20 2007  INR 1.1   Cardiac Enzymes: No results for input(s): CKTOTAL, CKMB, CKMBINDEX, TROPONINI in the last 168 hours. BNP (last 3 results) No results for input(s): PROBNP in the last 8760  hours. HbA1C: No results for input(s): HGBA1C in the last 72 hours. CBG: No results for input(s): GLUCAP in the last 168 hours. Lipid Profile: No results for input(s): CHOL, HDL, LDLCALC, TRIG, CHOLHDL, LDLDIRECT in the last 72 hours. Thyroid Function Tests: No results for input(s): TSH, T4TOTAL, FREET4, T3FREE, THYROIDAB in the last 72 hours. Anemia Panel: No results for input(s): VITAMINB12, FOLATE, FERRITIN, TIBC, IRON, RETICCTPCT in the last 72 hours. Urine analysis:    Component Value Date/Time   COLORURINE STRAW (A) 03/18/2020 1929   APPEARANCEUR CLEAR 03/18/2020 1929   LABSPEC 1.009 03/18/2020 1929   PHURINE 7.0 03/18/2020 1929   GLUCOSEU 150 (A) 03/18/2020 1929   HGBUR SMALL (A) 03/18/2020 1929   BILIRUBINUR NEGATIVE 03/18/2020 1929   BILIRUBINUR negative 08/01/2017 1732   BILIRUBINUR neg 07/30/2012 1410   KETONESUR 5 (A) 03/18/2020 1929   PROTEINUR 30 (A) 03/18/2020 1929   UROBILINOGEN 0.2 08/01/2017 1732   NITRITE NEGATIVE 03/18/2020 1929   LEUKOCYTESUR TRACE (A) 03/18/2020 1929    Radiological Exams on Admission: CT ABDOMEN PELVIS W CONTRAST  Result Date: 03/18/2020 CLINICAL DATA:   Vomiting. Recent UTI EXAM: CT ABDOMEN AND PELVIS WITH CONTRAST TECHNIQUE: Multidetector CT imaging of the abdomen and pelvis was performed using the standard protocol following bolus administration of intravenous contrast. CONTRAST:  OMNIPAQUE IOHEXOL 300 MG/ML  SOLN COMPARISON:  02/28/2019 FINDINGS: LOWER CHEST: Normal. HEPATOBILIARY: Normal hepatic contours. No intra- or extrahepatic biliary dilatation. The gallbladder is normal. PANCREAS: Normal pancreas. No ductal dilatation or peripancreatic fluid collection. SPLEEN: Normal. ADRENALS/URINARY TRACT: The adrenal glands are normal. Bilateral striated nephrograms with hypoattenuation of the upper renal poles. No hydronephrosis. The urinary bladder is normal for degree of distention STOMACH/BOWEL: There is no hiatal hernia. Normal duodenal course and caliber. No small bowel dilatation or inflammation. No focal colonic abnormality. Normal appendix. VASCULAR/LYMPHATIC: Normal course and caliber of the major abdominal vessels. No abdominal or pelvic lymphadenopathy. REPRODUCTIVE: Normal uterus and ovaries. MUSCULOSKELETAL. No bony spinal canal stenosis or focal osseous abnormality. OTHER: None. IMPRESSION: Bilateral striated nephrograms with hypoattenuation of the upper renal poles, compatible with acute pyelonephritis. Electronically Signed   By: Deatra Robinson M.D.   On: 03/18/2020 21:31    EKG: Independently reviewed.  Assessment/Plan Principal Problem:   Pyelonephritis Active Problems:   HTN (hypertension)   Type 2 diabetes mellitus with hyperglycemia, without long-term current use of insulin (HCC)   Intractable nausea and vomiting    1. Pyelonephritis - 1. Recurrent pyelonephritis vs failed 3rd gen cephalosporins 2. Cx from last admit with multiple sp present. 3. Will put on empiric merrem for tonight until ID can be consulted in AM. 4. IVF: 2L bolus and NS at 100 cc/hr overnight 5. Tele monitor for tachycardia 6. zofran PRN  nausea 7. Morphine PRN pain 8. Repeat UCx and BCx pending 2. H/o SVT - 1. Resume metoprolol in AM if BP remains stable overnight 3. DM2 -  1. Lantus 10 QHS 2. Sensitive SSI Q4H  DVT prophylaxis: Lovenox Code Status: Full Family Communication: No family in room Disposition Plan: Home after pyelonephritis treated Consults called: None, call ID in AM Admission status: Admit to inpatient  Severity of Illness: The appropriate patient status for this patient is INPATIENT. Inpatient status is judged to be reasonable and necessary in order to provide the required intensity of service to ensure the patient's safety. The patient's presenting symptoms, physical exam findings, and initial radiographic and laboratory data in the context of their chronic comorbidities is felt  to place them at high risk for further clinical deterioration. Furthermore, it is not anticipated that the patient will be medically stable for discharge from the hospital within 2 midnights of admission. The following factors support the patient status of inpatient.   IP status due to pyleonephritis, failed outpt treatment this week with macrobid, vs recurrent pyelonephritis, failed treatment with 3rd gen cephalosporin.  Also unable to tolerate POs at the moment.  * I certify that at the point of admission it is my clinical judgment that the patient will require inpatient hospital care spanning beyond 2 midnights from the point of admission due to high intensity of service, high risk for further deterioration and high frequency of surveillance required.*    Belkis Norbeck M. DO [facility]  How to contact the [facility] Attending or Consulting provider 7A - 7P or covering provider during after hours 7P -7A, for this patient?  1. Check the care team in [facility] and look for a) attending/consulting TR[facility] provider listed and b) the [facility] team listed 2. Log into [email or web address]  Amion Physician Scheduling and messaging for groups and whole  hospitals  On call and physician scheduling software for group practices, residents, hospitalists and other medical providers for call, clinic, rotation and shift schedules. OnCall [employer] is a hospital-wide system for scheduling doctors and paging doctors on call. EasyPlot is for scientific plotting and data analysis.  [email or web address]  and use [place] universal password to access. If you do not have the password, please contact the hospital operator.  3. Locate the [facility] provider you are looking for under [facility] and page to a number that you can be directly reached. 4. If you still have difficulty reaching the provider, please page the [facility] (Director on Call) for the Hospitalists listed on amion for assistance.  03/18/2020, 11:52 PM

## 2020-03-19 ENCOUNTER — Other Ambulatory Visit: Payer: Self-pay

## 2020-03-19 DIAGNOSIS — I471 Supraventricular tachycardia: Secondary | ICD-10-CM

## 2020-03-19 DIAGNOSIS — E039 Hypothyroidism, unspecified: Secondary | ICD-10-CM | POA: Diagnosis present

## 2020-03-19 LAB — BLOOD CULTURE ID PANEL (REFLEXED)

## 2020-03-19 LAB — GLUCOSE, CAPILLARY
Glucose-Capillary: 240 mg/dL — ABNORMAL HIGH (ref 70–99)
Glucose-Capillary: 205 mg/dL — ABNORMAL HIGH (ref 70–99)
Glucose-Capillary: 191 mg/dL — ABNORMAL HIGH (ref 70–99)

## 2020-03-19 LAB — MAGNESIUM: Magnesium: 1.9 mg/dL (ref 1.7–2.4)

## 2020-03-19 LAB — CBC
HCT: 32.4 % — ABNORMAL LOW (ref 36.0–46.0)
Hemoglobin: 10 g/dL — ABNORMAL LOW (ref 12.0–15.0)
MCH: 24.8 pg — ABNORMAL LOW (ref 26.0–34.0)
MCHC: 30.9 g/dL (ref 30.0–36.0)
MCV: 80.4 fL (ref 80.0–100.0)
Platelets: 256 10*3/uL (ref 150–400)
RBC: 4.03 MIL/uL (ref 3.87–5.11)
RDW: 13.9 % (ref 11.5–15.5)
WBC: 8.7 10*3/uL (ref 4.0–10.5)
nRBC: 0 % (ref 0.0–0.2)

## 2020-03-19 LAB — CBG MONITORING, ED
Glucose-Capillary: 159 mg/dL — ABNORMAL HIGH (ref 70–99)
Glucose-Capillary: 237 mg/dL — ABNORMAL HIGH (ref 70–99)
Glucose-Capillary: 253 mg/dL — ABNORMAL HIGH (ref 70–99)

## 2020-03-19 LAB — URINE CULTURE

## 2020-03-19 LAB — BASIC METABOLIC PANEL
Anion gap: 8 (ref 5–15)
BUN: 7 mg/dL (ref 6–20)
CO2: 26 mmol/L (ref 22–32)
Calcium: 8.2 mg/dL — ABNORMAL LOW (ref 8.9–10.3)
Chloride: 103 mmol/L (ref 98–111)
Creatinine, Ser: 0.59 mg/dL (ref 0.44–1.00)
GFR calc Af Amer: >60 mL/min (ref >60)
GFR calc non Af Amer: >60 mL/min (ref >60)
Glucose, Bld: 201 mg/dL — ABNORMAL HIGH (ref 70–99)
Potassium: 3.4 mmol/L — ABNORMAL LOW (ref 3.5–5.1)
Sodium: 137 mmol/L (ref 135–145)

## 2020-03-19 MED ORDER — INSULIN GLARGINE 100 UNIT/ML ~~LOC~~ SOLN
15.0000 [IU] | Freq: Every day | SUBCUTANEOUS | Status: DC
  Administered 2020-03-19 – 2020-03-21 (×3): 15 [IU] via SUBCUTANEOUS
  Filled 2020-03-19 (×4): qty 0.15

## 2020-03-19 MED ORDER — INSULIN ASPART 100 UNIT/ML ~~LOC~~ SOLN
5.0000 [IU] | Freq: Three times a day (TID) | SUBCUTANEOUS | Status: DC
  Administered 2020-03-20 – 2020-03-22 (×9): 5 [IU] via SUBCUTANEOUS

## 2020-03-19 MED ORDER — POTASSIUM CHLORIDE CRYS ER 20 MEQ PO TBCR
40.0000 meq | EXTENDED_RELEASE_TABLET | Freq: Once | ORAL | Status: AC
  Administered 2020-03-19: 40 meq via ORAL
  Filled 2020-03-19: qty 2

## 2020-03-19 NOTE — Plan of Care (Signed)
  Problem: Activity: Goal: Risk for activity intolerance will decrease Outcome: Progressing   Problem: Nutrition: Goal: Adequate nutrition will be maintained Outcome: Progressing   Problem: Pain Managment: Goal: General experience of comfort will improve Outcome: Progressing   

## 2020-03-19 NOTE — Progress Notes (Signed)
PROGRESS NOTE    Julia [name]  WEX:[id number] DOB: 05-May-1971 DOA: 03/18/2020 PCP: Sherren Mocha, MD    Chief Complaint  Patient presents with  . Emesis    Brief Narrative:  HPI per Dr. Jamal Collin is a 49 y.o. female with medical history significant of HTN, DM2, had COVID in Feb.  Pt admitted with pyelonephritis at end of march, treated with rocephin and discharged on omnicef.  Pt dx with UTI 3 days ago again, N/V onset this morning, unable to keep anything down.  Developed increased B back / flank pain.  Fever up to 101.x at home today, in to ED.  Was on macrobid by PCP.   ED Course: CT scan demonstrates B pyelonephritis findings.  Given dose of rocephin, given 2L IVF bolus.  Urology recd ID consult.   Assessment & Plan:   Principal Problem:   Pyelonephritis Active Problems:   HTN (hypertension)   Type 2 diabetes mellitus with hyperglycemia, without long-term current use of insulin (HCC)   Intractable nausea and vomiting  1 recurrent acute pyelonephritis Patient recently discharged after being admitted and treated for pyelonephritis at the end of March with completion of oral Omnicef on discharge per patient.  Patient presented with nausea vomiting bilateral flank pain, inability to keep anything down, fever with a temperature of 101.  Patient noted to have been on Macrobid by PCP prior to admission.  CT abdomen and pelvis done on admission consistent with bilateral pyelonephritis.  No stones noted, no abscess noted on CT scan.  Patient pancultured and blood cultures pending.  Cultures from last admission with multiple species present.  We will repeat urine cultures catheterized.  Continue IV Merrem.  Antiemetics, pain management, supportive care.  Patient tolerating clear liquids and asking diet to be advanced.  Advance to full liquid diet.  Consult with ID for further evaluation and management.  Follow.  2.  hx SVT Metoprolol resumed.  Follow.  3.  Poorly  controlled diabetes mellitus type 2 Hemoglobin A1c 13.6 (02/28/2020).  CBG of 159 this morning.  Increase Lantus back to home dose of 15 units daily.  Resume meal coverage NovoLog 5 units 3 times daily with meals.  SSI.  As diet is advanced may need to titrate insulin.  Follow.  4.  Hypothyroidism Continue home dose Synthroid.   DVT prophylaxis: Lovenox Code Status: Full Family Communication: Updated patient.  No family at bedside. Disposition:   Status is: Inpatient    Dispo: The patient is from: Home              Anticipated d/c is to: Home              Anticipated d/c date is: 03/22/2020              Patient currently pancultured, on IV antibiotics, admitted with recurrent pyelonephritis despite treatment with third-generation cephalosporins.        Consultants:   ID pending  Urology curb sided by admitting physician.  Procedures:  CT abdomen and pelvis 03/18/2020  Antimicrobials:  IV Merrem 03/19/2020   Subjective: Patient laying in bed.  States flank pain has improved.  Complaining of some dysuria.  Denies any chest pain no shortness of breath.  Tolerating clear liquids.  Denies any further nausea or vomiting.  Asking for diet to be advanced.  Objective: Vitals:   03/19/20 0800 03/19/20 0815 03/19/20 0830 03/19/20 1004  BP: 130/84  126/82 96/63  Pulse: (!) 105  Marland Kitchen)  101 84  Resp: 17 17 15 16   Temp:    98.9 F (37.2 C)  TempSrc:    Oral  SpO2: 100%  98% 95%    Intake/Output Summary (Last 24 hours) at 03/19/2020 1439 Last data filed at 03/19/2020 0817 Gross per 24 hour  Intake 1198.3 ml  Output --  Net 1198.3 ml   There were no vitals filed for this visit.  Examination:  General exam: Appears calm and comfortable  Respiratory system: Clear to auscultation. Respiratory effort normal. Cardiovascular system: S1 & S2 heard, RRR. No JVD, murmurs, rubs, gallops or clicks. No pedal edema. Gastrointestinal system: Abdomen is nondistended, soft and nontender.  No organomegaly or masses felt. Normal bowel sounds heard. Central nervous system: Alert and oriented. No focal neurological deficits. Extremities: Symmetric 5 x 5 power. Skin: No rashes, lesions or ulcers Psychiatry: Judgement and insight appear normal. Mood & affect appropriate.     Data Reviewed: I have personally reviewed following labs and imaging studies  CBC: Recent Labs  Lab 03/18/20 2111 03/19/20 0512  WBC 8.3 8.7  HGB 9.9* 10.0*  HCT 32.1* 32.4*  MCV 80.9 80.4  PLT 243 256    Basic Metabolic Panel: Recent Labs  Lab 03/18/20 1928 03/19/20 0512  NA 136 137  K 4.3 3.4*  CL 99 103  CO2 26 26  GLUCOSE 238* 201*  BUN 11 7  CREATININE 0.68 0.59  CALCIUM 9.2 8.2*  MG  --  1.9    GFR: CrCl cannot be calculated (Unknown ideal weight.).  Liver Function Tests: Recent Labs  Lab 03/18/20 1928  AST 23  ALT 14  ALKPHOS 144*  BILITOT 0.9  PROT 8.2*  ALBUMIN 3.8    CBG: Recent Labs  Lab 03/19/20 0036 03/19/20 0323 03/19/20 0726 03/19/20 1210  GLUCAP 253* 237* 159* 240*     Recent Results (from the past 240 hour(s))  Blood Culture (routine x 2)     Status: None (Preliminary result)   Collection Time: 03/18/20  8:21 PM   Specimen: BLOOD  Result Value Ref Range Status   Specimen Description   Final    BLOOD RIGHT ANTECUBITAL Performed at [facility], [address], [place], [place]    Special Requests   Final    BOTTLES DRAWN AEROBIC AND ANAEROBIC Blood Culture adequate volume Performed at [facility], [address] [place], [place]    Culture PENDING  Incomplete   Report Status PENDING  Incomplete         Radiology Studies: CT ABDOMEN PELVIS W CONTRAST  Result Date: 03/18/2020 CLINICAL DATA:  Vomiting. Recent UTI EXAM: CT ABDOMEN AND PELVIS WITH CONTRAST TECHNIQUE: Multidetector CT imaging of the abdomen and pelvis was performed using the standard protocol following bolus administration of  intravenous contrast. CONTRAST:  03/20/2020 OMNIPAQUE IOHEXOL 300 MG/ML  SOLN COMPARISON:  02/28/2019 FINDINGS: LOWER CHEST: Normal. HEPATOBILIARY: Normal hepatic contours. No intra- or extrahepatic biliary dilatation. The gallbladder is normal. PANCREAS: Normal pancreas. No ductal dilatation or peripancreatic fluid collection. SPLEEN: Normal. ADRENALS/URINARY TRACT: The adrenal glands are normal. Bilateral striated nephrograms with hypoattenuation of the upper renal poles. No hydronephrosis. The urinary bladder is normal for degree of distention STOMACH/BOWEL: There is no hiatal hernia. Normal duodenal course and caliber. No small bowel dilatation or inflammation. No focal colonic abnormality. Normal appendix. VASCULAR/LYMPHATIC: Normal course and caliber of the major abdominal vessels. No abdominal or pelvic lymphadenopathy. REPRODUCTIVE: Normal uterus and ovaries. MUSCULOSKELETAL. No bony  spinal canal stenosis or focal osseous abnormality. OTHER: None. IMPRESSION: Bilateral striated nephrograms with hypoattenuation of the upper renal poles, compatible with acute pyelonephritis. Electronically Signed   By: Ulyses Jarred M.D.   On: 03/18/2020 21:31        Scheduled Meds: . enoxaparin (LOVENOX) injection  40 mg Subcutaneous Daily  . insulin aspart  0-9 Units Subcutaneous Q4H  . insulin glargine  10 Units Subcutaneous QHS  . levothyroxine  75 mcg Oral QAC breakfast  . loratadine  10 mg Oral Daily  . metoprolol tartrate  50 mg Oral BID   Continuous Infusions: . sodium chloride 100 mL/hr at 03/19/20 0328  . meropenem (MERREM) IV Stopped (03/19/20 0817)     LOS: 1 day    Time spent: 35 minutes    Irine Seal, MD [facility]   To contact the attending provider between 7A-7P or the covering provider during after hours 7P-7A, please log into the web site [email or web address] and access using universal [place] password for that web site. If you do not have the password, please call the  hospital operator.  03/19/2020, 2:39 PM

## 2020-03-19 NOTE — Progress Notes (Signed)
PHARMACY - PHYSICIAN COMMUNICATION CRITICAL VALUE ALERT - BLOOD CULTURE IDENTIFICATION (BCID)  Julia [name] is an 49 y.o. female who presented to [facility] on 03/18/2020 with a chief complaint of pyelonephritis  Assessment:  4/4 BCx bottles growing E coli, no KPC (suspect urinary source)  Name of physician (or Provider) Contacted: Janee Morn  Current antibiotics: meropenem  Changes to prescribed antibiotics recommended: consistent with ID recs, continue meropenem as possible ESBL given failure of cephs Patient is on recommended antibiotics - No changes needed  Results for orders placed or performed during the hospital encounter of 03/18/20  Blood Culture ID Panel (Reflexed) (Collected: 03/18/2020  8:21 PM)  Result Value Ref Range   Enterococcus species NOT DETECTED NOT DETECTED   Listeria monocytogenes NOT DETECTED NOT DETECTED   Staphylococcus species NOT DETECTED NOT DETECTED   Staphylococcus aureus (BCID) NOT DETECTED NOT DETECTED   Streptococcus species NOT DETECTED NOT DETECTED   Streptococcus agalactiae NOT DETECTED NOT DETECTED   Streptococcus pneumoniae NOT DETECTED NOT DETECTED   Streptococcus pyogenes NOT DETECTED NOT DETECTED   Acinetobacter baumannii NOT DETECTED NOT DETECTED   Enterobacteriaceae species DETECTED (A) NOT DETECTED   Enterobacter cloacae complex NOT DETECTED NOT DETECTED   Escherichia coli DETECTED (A) NOT DETECTED   Klebsiella oxytoca NOT DETECTED NOT DETECTED   Klebsiella pneumoniae NOT DETECTED NOT DETECTED   Proteus species NOT DETECTED NOT DETECTED   Serratia marcescens NOT DETECTED NOT DETECTED   Carbapenem resistance NOT DETECTED NOT DETECTED   Haemophilus influenzae NOT DETECTED NOT DETECTED   Neisseria meningitidis NOT DETECTED NOT DETECTED   Pseudomonas aeruginosa NOT DETECTED NOT DETECTED   Candida albicans NOT DETECTED NOT DETECTED   Candida glabrata NOT DETECTED NOT DETECTED   Candida krusei NOT DETECTED NOT DETECTED   Candida parapsilosis  NOT DETECTED NOT DETECTED   Candida tropicalis NOT DETECTED NOT DETECTED    [name] A 03/19/2020  4:27 PM

## 2020-03-19 NOTE — Progress Notes (Signed)
Pt arrived to unit. VS: BP:96/63; Temp: 98.9; HR: 84; O2: 95%. Will continue to monitor.

## 2020-03-19 NOTE — Treatment Plan (Signed)
Discussed patient with Dr Julian Reil after notification that she is being admitted for pyelonephritis from ED provider. CT scan shows no hydronephrosis, stones, or abscess. Admitting team agreed no urology consultation indicated at this time given no urologic intervention indicated. We agree with hospitalist admission and ID consult for antibiotic plan. Please let us know if any questions arise and we can be of assistance. Discussed with Dr Mena Goes, urology attending, who agrees no urology consult or intervention needed at this time.

## 2020-03-19 NOTE — Consult Note (Addendum)
[facility]  Total days of antibiotics 2/meropenem               Reason for Consult:recurrent pyelonephritis    Referring Physician: Janee Mornthompson  Principal Problem:   Pyelonephritis Active Problems:   HTN (hypertension)   Type 2 diabetes mellitus with hyperglycemia, without long-term current use of insulin (HCC)   Intractable nausea and vomiting    HPI: Julia [name] is a 49 y.o. female with hx of HTN and IDDM, recovered from COVID in Feb 2021 was recently hospitalized in late March 2021 for pyelonephritis, she was treated with ceftriaxone and discharged on course of oral cephalosporins. Interestingly at the time of having COVID-19, she also had UTI at that time. She now presents to the ED with 3 day history of dysuria and subsequently worsening with N/V/fever and back pain. She had CT that showed bilateral pyelonephritis. She was empirically started on meropenem. Preliminarily, blood cx are showing GNR. CT does not suggest any nephrolithiasis as nidus.  Past Medical History:  Diagnosis Date  . Complication of anesthesia    I had a reaction to a epidural "broke out"  . Condyloma acuminatum of vulva   . COVID-19   . GERD (gastroesophageal reflux disease)   . History of supraventricular tachycardia   . HSV-1 infection   . HSV-2 infection   . HTN (hypertension)   . Hyperlipidemia   . Type 2 diabetes mellitus (HCC)     Allergies:  Allergies  Allergen Reactions  . Sulfa Antibiotics Hives  . Sulfur Other (See Comments)    blisters  . Trulicity [Dulaglutide]     Abdominal pain  . Hctz [Hydrochlorothiazide] Rash    Possible photosensitivity dermatitis  . Invokana [Canagliflozin] Rash    Possible photosensitivity dermatitis    MEDICATIONS: . enoxaparin (LOVENOX) injection  40 mg Subcutaneous Daily  . insulin aspart  0-9 Units Subcutaneous Q4H  . insulin glargine  10 Units Subcutaneous QHS  . levothyroxine  75 mcg Oral QAC breakfast  . loratadine  10  mg Oral Daily  . metoprolol tartrate  50 mg Oral BID    Social History   Tobacco Use  . Smoking status: Former Smoker    Years: 8.00    Types: Cigarettes    Quit date: 12/16/2002    Years since quitting: 17.2  . Smokeless tobacco: Never Used  Substance Use Topics  . Alcohol use: Yes    Alcohol/week: 2.0 standard drinks    Types: 2 Standard drinks or equivalent per week    Comment: rare  . Drug use: No    Family History  Problem Relation Age of Onset  . Hypertension Mother   . Thyroid disease Mother   . Diabetes Mother   . Thyroid disease Sister   . Diabetes Maternal Grandmother   . Hyperlipidemia Other   . Thyroid disease Other      Review of Systems  Constitutional: positive for fever, chills, diaphoresis, but negative for activity change, appetite change, fatigue and unexpected weight change.  HENT: Negative for congestion, sore throat, rhinorrhea, sneezing, trouble swallowing and sinus pressure.  Eyes: Negative for photophobia and visual disturbance.  Respiratory: Negative for cough, chest tightness, shortness of breath, wheezing and stridor.  Cardiovascular: Negative for chest pain, palpitations and leg swelling.  Gastrointestinal: Negative for nausea, vomiting, abdominal pain, diarrhea, constipation, blood in stool, abdominal distention and anal bleeding.  Genitourinary: +dysuriaNegative for dysuria, hematuria, flank pain and difficulty urinating.  Musculoskeletal: +flank  pain.Negative for myalgias, back pain, joint swelling, arthralgias and gait problem.  Skin: Negative for color change, pallor, rash and wound.  Neurological: Negative for dizziness, tremors, weakness and light-headedness.  Hematological: Negative for adenopathy. Does not bruise/bleed easily.  Psychiatric/Behavioral: Negative for behavioral problems, confusion, sleep disturbance, dysphoric mood, decreased concentration and agitation.     OBJECTIVE: Temp:  [98.5 F (36.9 C)-100.3 F (37.9 C)]  98.9 F (37.2 C) (04/16 1004) Pulse Rate:  [84-118] 84 (04/16 1004) Resp:  [12-23] 16 (04/16 1004) BP: (96-159)/(61-107) 96/63 (04/16 1004) SpO2:  [91 %-100 %] 95 % (04/16 1004) Physical Exam  Constitutional:  oriented to person, place, and time. appears well-developed and well-nourished. No distress.  HENT: [place]/AT, PERRLA, no scleral icterus Mouth/Throat: Oropharynx is clear and moist. No oropharyngeal exudate.  Cardiovascular: Normal rate, regular rhythm and normal heart sounds. Exam reveals no gallop and no friction rub.  No murmur heard.  Pulmonary/Chest: Effort normal and breath sounds normal. No respiratory distress.  has no wheezes.  Neck = supple, no nuchal rigidity Abdominal: Soft. Bowel sounds are normal.  exhibits no distension. There is no tenderness.  Lymphadenopathy: no cervical adenopathy. No axillary adenopathy Neurological: alert and oriented to person, place, and time.  Skin: Skin is warm and dry. No rash noted. No erythema.  Psychiatric: a normal mood and affect.  behavior is normal.    LABS: Results for orders placed or performed during the hospital encounter of 03/18/20 (from the past 48 hour(s))  Lipase, blood     Status: None   Collection Time: 03/18/20  7:28 PM  Result Value Ref Range   Lipase 20 11 - 51 U/L    Comment: Performed at [facility], [address], [place], [place] [address]  Comprehensive metabolic panel     Status: Abnormal   Collection Time: 03/18/20  7:28 PM  Result Value Ref Range   Sodium 136 135 - 145 mmol/L   Potassium 4.3 3.5 - 5.1 mmol/L   Chloride 99 98 - 111 mmol/L   CO2 26 22 - 32 mmol/L   Glucose, Bld 238 (H) 70 - 99 mg/dL    Comment: Glucose reference range applies only to samples taken after fasting for at least 8 hours.   BUN 11 6 - 20 mg/dL   Creatinine, Ser 2.80 0.44 - 1.00 mg/dL   Calcium 9.2 8.9 - 03.4 mg/dL   Total Protein 8.2 (H) 6.5 - 8.1 g/dL   Albumin 3.8 3.5 - 5.0 g/dL   AST 23 15 - 41 U/L    ALT 14 0 - 44 U/L   Alkaline Phosphatase 144 (H) 38 - 126 U/L   Total Bilirubin 0.9 0.3 - 1.2 mg/dL   GFR calc non Af Amer >60 >60 mL/min   GFR calc Af Amer >60 >60 mL/min   Anion gap 11 5 - 15    Comment: Performed at [facility], [address], [place], [place] [address]  Urinalysis, Routine w reflex microscopic     Status: Abnormal   Collection Time: 03/18/20  7:29 PM  Result Value Ref Range   Color, Urine STRAW (A) YELLOW   APPearance CLEAR CLEAR   Specific Gravity, Urine 1.009 1.005 - 1.030   pH 7.0 5.0 - 8.0   Glucose, UA 150 (A) NEGATIVE mg/dL   Hgb urine dipstick SMALL (A) NEGATIVE   Bilirubin Urine NEGATIVE NEGATIVE   Ketones, ur 5 (A) NEGATIVE mg/dL   Protein, ur 30 (A) NEGATIVE mg/dL  Nitrite NEGATIVE NEGATIVE   Leukocytes,Ua TRACE (A) NEGATIVE   RBC / HPF 0-5 0 - 5 RBC/hpf   WBC, UA 11-20 0 - 5 WBC/hpf   Bacteria, UA RARE (A) NONE SEEN   Squamous Epithelial / LPF 0-5 0 - 5    Comment: Performed at [facility], [place] [address], [place], [place] [address]  APTT     Status: None   Collection Time: 03/18/20  8:07 PM  Result Value Ref Range   aPTT 32 24 - 36 seconds    Comment: Performed at [facility], [place] [address], [place], [place] [address]  Protime-INR     Status: None   Collection Time: 03/18/20  8:07 PM  Result Value Ref Range   Prothrombin Time 13.7 11.4 - 15.2 seconds   INR 1.1 0.8 - 1.2    Comment: (NOTE) INR goal varies based on device and disease states. Performed at [facility], [place] [address], [place], [place] [address]   Lactic acid, plasma     Status: None   Collection Time: 03/18/20  8:21 PM  Result Value Ref Range   Lactic Acid, Venous 1.7 0.5 - 1.9 mmol/L    Comment: Performed at [facility], [place] [address] [place], [place] [address]  Blood Culture (routine x 2)     Status: None (Preliminary result)   Collection Time: 03/18/20  8:21 PM    Specimen: BLOOD  Result Value Ref Range   Specimen Description      BLOOD LEFT ANTECUBITAL Performed at [facility], [place] [address], [place], [place] [address]    Special Requests      BOTTLES DRAWN AEROBIC AND ANAEROBIC Blood Culture adequate volume Performed at [place] [address], [place], [place] [address]    Culture  Setup Time      GRAM NEGATIVE RODS ANAEROBIC BOTTLE ONLY Organism ID to follow Performed at [facility], [place] [address] [place], [place] [address]    Culture PENDING    Report Status PENDING   Blood Culture (routine x 2)     Status: None (Preliminary result)   Collection Time: 03/18/20  8:21 PM   Specimen: BLOOD  Result Value Ref Range   Specimen Description      BLOOD RIGHT ANTECUBITAL Performed at [facility], [place] [address] [place], [place] [address]    Special Requests      BOTTLES DRAWN AEROBIC AND ANAEROBIC Blood Culture adequate volume   Culture  Setup Time      GRAM NEGATIVE RODS ANAEROBIC BOTTLE ONLY Performed at [place] [facility], [place] [address], [place], [place] [address]    Culture PENDING    Report Status PENDING   I-Stat beta hCG blood, ED     Status: None   Collection Time: 03/18/20  8:38 PM  Result Value Ref Range   I-stat hCG, quantitative <5.0 <5 mIU/mL   Comment 3            Comment:   GEST. AGE      CONC.  (mIU/mL)   <=1 WEEK        5 - 50     2 WEEKS       50 - 500     3 WEEKS       100 - 10,000     4 WEEKS     1,000 - 30,000  FEMALE AND NON-PREGNANT FEMALE:     LESS THAN 5 mIU/mL   CBC     Status: Abnormal   Collection Time: 03/18/20  9:11 PM  Result Value Ref Range   WBC 8.3 4.0 - 10.5 K/uL   RBC 3.97 3.87 - 5.11 MIL/uL   Hemoglobin 9.9 (L) 12.0 - 15.0 g/dL   HCT 96.2 (L) 95.2 - 84.1 %   MCV 80.9 80.0 - 100.0 fL   MCH 24.9 (L) 26.0 - 34.0 pg   MCHC 30.8 30.0 - 36.0 g/dL   RDW 32.4 40.1 - 02.7 %   Platelets 243 150 - 400 K/uL   nRBC 0.0  0.0 - 0.2 %    Comment: Performed at [facility], [address] [place], [place] [address]  Lactic acid, plasma     Status: None   Collection Time: 03/18/20 10:05 PM  Result Value Ref Range   Lactic Acid, Venous 1.0 0.5 - 1.9 mmol/L    Comment: Performed at [facility], [address] [place], [place] [address]  CBG monitoring, ED     Status: Abnormal   Collection Time: 03/19/20 12:36 AM  Result Value Ref Range   Glucose-Capillary 253 (H) 70 - 99 mg/dL    Comment: Glucose reference range applies only to samples taken after fasting for at least 8 hours.  CBG monitoring, ED     Status: Abnormal   Collection Time: 03/19/20  3:23 AM  Result Value Ref Range   Glucose-Capillary 237 (H) 70 - 99 mg/dL    Comment: Glucose reference range applies only to samples taken after fasting for at least 8 hours.  CBC     Status: Abnormal   Collection Time: 03/19/20  5:12 AM  Result Value Ref Range   WBC 8.7 4.0 - 10.5 K/uL   RBC 4.03 3.87 - 5.11 MIL/uL   Hemoglobin 10.0 (L) 12.0 - 15.0 g/dL   HCT 74.2 (L) 59.5 - 63.8 %   MCV 80.4 80.0 - 100.0 fL   MCH 24.8 (L) 26.0 - 34.0 pg   MCHC 30.9 30.0 - 36.0 g/dL   RDW 75.6 43.3 - 29.5 %   Platelets 256 150 - 400 K/uL   nRBC 0.0 0.0 - 0.2 %    Comment: Performed at [facility], [address], [place], [place] [address]  Basic metabolic panel     Status: Abnormal   Collection Time: 03/19/20  5:12 AM  Result Value Ref Range   Sodium 137 135 - 145 mmol/L   Potassium 3.4 (L) 3.5 - 5.1 mmol/L   Chloride 103 98 - 111 mmol/L   CO2 26 22 - 32 mmol/L   Glucose, Bld 201 (H) 70 - 99 mg/dL    Comment: Glucose reference range applies only to samples taken after fasting for at least 8 hours.   BUN 7 6 - 20 mg/dL   Creatinine, Ser 6.60 0.44 - 1.00 mg/dL   Calcium 8.2 (L) 8.9 - 10.3 mg/dL   GFR calc non Af Amer >60 >60 mL/min   GFR calc Af Amer >60 >60 mL/min   Anion gap 8 5 - 15    Comment: Performed  at [facility], [address], [place], [place] [address]  Magnesium     Status: None   Collection Time: 03/19/20  5:12 AM  Result Value Ref Range   Magnesium 1.9 1.7 - 2.4 mg/dL    Comment: Performed at [facility], [address] [place], [place] [address]  CBG monitoring, ED     Status: Abnormal   Collection Time: 03/19/20  7:26 AM  Result Value Ref Range   Glucose-Capillary 159 (H) 70 - 99 mg/dL    Comment: Glucose reference range applies only to samples taken after fasting for at least 8 hours.  Glucose, capillary     Status: Abnormal   Collection Time: 03/19/20 12:10 PM  Result Value Ref Range   Glucose-Capillary 240 (H) 70 - 99 mg/dL    Comment: Glucose reference range applies only to samples taken after fasting for at least 8 hours.    MICRO: 4/15 blood cx GNR 4/15 urine cx IMAGING: CT ABDOMEN PELVIS W CONTRAST  Result Date: 03/18/2020 CLINICAL DATA:  Vomiting. Recent UTI EXAM: CT ABDOMEN AND PELVIS WITH CONTRAST TECHNIQUE: Multidetector CT imaging of the abdomen and pelvis was performed using the standard protocol following bolus administration of intravenous contrast. CONTRAST:  OMNIPAQUE IOHEXOL 300 MG/ML  SOLN COMPARISON:  02/28/2019 FINDINGS: LOWER CHEST: Normal. HEPATOBILIARY: Normal hepatic contours. No intra- or extrahepatic biliary dilatation. The gallbladder is normal. PANCREAS: Normal pancreas. No ductal dilatation or peripancreatic fluid collection. SPLEEN: Normal. ADRENALS/URINARY TRACT: The adrenal glands are normal. Bilateral striated nephrograms with hypoattenuation of the upper renal poles. No hydronephrosis. The urinary bladder is normal for degree of distention STOMACH/BOWEL: There is no hiatal hernia. Normal duodenal course and caliber. No small bowel dilatation or inflammation. No focal colonic abnormality. Normal appendix. VASCULAR/LYMPHATIC: Normal course and caliber of the major abdominal vessels. No abdominal or  pelvic lymphadenopathy. REPRODUCTIVE: Normal uterus and ovaries. MUSCULOSKELETAL. No bony spinal canal stenosis or focal osseous abnormality. OTHER: None. IMPRESSION: Bilateral striated nephrograms with hypoattenuation of the upper renal poles, compatible with acute pyelonephritis. Electronically Signed   By: Deatra Robinson M.D.   On: 03/18/2020 21:31    Assessment/Plan:  49yo F with HTN, IDDM with recurrent pyelonephritis with secondary bacteremia, roughly 2 wk since last hospitalization suspect has MDRO cause since she may not have responded to prior treatment with cephalosporin  -agree with primary teams decision with meropemen (concern for ESBL)  - has associated bacteremia. Will follow to see how she responds to abtx, and follow cx results to decide if we can narrow abtx. - dr comer available this weekend for questions, and give feedback on abtx choice - if ESBL, would recommend to get picc line after 48hrs of abtx and at least 24hr of being afebrile, plan to treat for 10-14 days

## 2020-03-20 DIAGNOSIS — B9620 Unspecified Escherichia coli [E. coli] as the cause of diseases classified elsewhere: Secondary | ICD-10-CM

## 2020-03-20 DIAGNOSIS — R7881 Bacteremia: Secondary | ICD-10-CM | POA: Diagnosis present

## 2020-03-20 LAB — URINE CULTURE: Culture: NO GROWTH

## 2020-03-20 LAB — RENAL FUNCTION PANEL
Albumin: 2.6 g/dL — ABNORMAL LOW (ref 3.5–5.0)
Anion gap: 6 (ref 5–15)
BUN: 7 mg/dL (ref 6–20)
CO2: 25 mmol/L (ref 22–32)
Calcium: 8 mg/dL — ABNORMAL LOW (ref 8.9–10.3)
Chloride: 105 mmol/L (ref 98–111)
Creatinine, Ser: 0.57 mg/dL (ref 0.44–1.00)
GFR calc Af Amer: >60 mL/min
GFR calc non Af Amer: >60 mL/min
Glucose, Bld: 157 mg/dL — ABNORMAL HIGH (ref 70–99)
Phosphorus: 2.6 mg/dL (ref 2.5–4.6)
Potassium: 4 mmol/L (ref 3.5–5.1)
Sodium: 136 mmol/L (ref 135–145)

## 2020-03-20 LAB — CBC WITH DIFFERENTIAL/PLATELET
Abs Immature Granulocytes: 0.02 10*3/uL (ref 0.00–0.07)
Basophils Absolute: 0 10*3/uL (ref 0.0–0.1)
Basophils Relative: 1 %
Eosinophils Absolute: 0.1 10*3/uL (ref 0.0–0.5)
Eosinophils Relative: 1 %
HCT: 30.3 % — ABNORMAL LOW (ref 36.0–46.0)
Hemoglobin: 8.9 g/dL — ABNORMAL LOW (ref 12.0–15.0)
Immature Granulocytes: 0 %
Lymphocytes Relative: 23 %
Lymphs Abs: 1.3 10*3/uL (ref 0.7–4.0)
MCH: 24.9 pg — ABNORMAL LOW (ref 26.0–34.0)
MCHC: 29.4 g/dL — ABNORMAL LOW (ref 30.0–36.0)
MCV: 84.6 fL (ref 80.0–100.0)
Monocytes Absolute: 0.6 10*3/uL (ref 0.1–1.0)
Monocytes Relative: 10 %
Neutro Abs: 3.8 10*3/uL (ref 1.7–7.7)
Neutrophils Relative %: 65 %
Platelets: 208 10*3/uL (ref 150–400)
RBC: 3.58 MIL/uL — ABNORMAL LOW (ref 3.87–5.11)
RDW: 14 % (ref 11.5–15.5)
WBC: 5.9 10*3/uL (ref 4.0–10.5)
nRBC: 0 % (ref 0.0–0.2)

## 2020-03-20 LAB — MAGNESIUM: Magnesium: 1.9 mg/dL (ref 1.7–2.4)

## 2020-03-20 LAB — GLUCOSE, CAPILLARY
Glucose-Capillary: 122 mg/dL — ABNORMAL HIGH (ref 70–99)
Glucose-Capillary: 130 mg/dL — ABNORMAL HIGH (ref 70–99)
Glucose-Capillary: 174 mg/dL — ABNORMAL HIGH (ref 70–99)
Glucose-Capillary: 183 mg/dL — ABNORMAL HIGH (ref 70–99)
Glucose-Capillary: 193 mg/dL — ABNORMAL HIGH (ref 70–99)
Glucose-Capillary: 194 mg/dL — ABNORMAL HIGH (ref 70–99)
Glucose-Capillary: 208 mg/dL — ABNORMAL HIGH (ref 70–99)

## 2020-03-20 MED ORDER — MAGNESIUM SULFATE 2 GM/50ML IV SOLN
2.0000 g | Freq: Once | INTRAVENOUS | Status: AC
  Administered 2020-03-20: 2 g via INTRAVENOUS
  Filled 2020-03-20: qty 50

## 2020-03-20 NOTE — Progress Notes (Signed)
PROGRESS NOTE    Julia [name]  ZOX:[id number] DOB: [birth year] DOA: 03/18/2020 PCP: Sherren Mocha, MD    Chief Complaint  Patient presents with  . Emesis    Brief Narrative:  HPI per Dr. Jamal Collin is a 49 y.o. female with medical history significant of HTN, DM2, had COVID in Feb.  Pt admitted with pyelonephritis at end of march, treated with rocephin and discharged on omnicef.  Pt dx with UTI 3 days ago again, N/V onset this morning, unable to keep anything down.  Developed increased B back / flank pain.  Fever up to 101.x at home today, in to ED.  Was on macrobid by PCP.   ED Course: CT scan demonstrates B pyelonephritis findings.  Given dose of rocephin, given 2L IVF bolus.  Urology recd ID consult.   Assessment & Plan:   Principal Problem:   Pyelonephritis Active Problems:   HTN (hypertension)   Type 2 diabetes mellitus with hyperglycemia, without long-term current use of insulin (HCC)   Intractable nausea and vomiting   Hypothyroidism   Bacteremia due to Gram-negative bacteria  1 recurrent acute pyelonephritis/E. coli bacteremia r/o ESBL Patient recently discharged after being admitted and treated for pyelonephritis at the end of March with completion of oral Omnicef on discharge per patient.  Patient presented with nausea vomiting bilateral flank pain, inability to keep anything down, fever with a temperature of 101.  Patient noted to have been on Macrobid by PCP prior to admission.  CT abdomen and pelvis done on admission consistent with bilateral pyelonephritis.  No stones noted, no abscess noted on CT scan.  Patient pancultured and blood cultures preliminary results positive for E. coli bacteremia however finalization and sensitivities pending.  Concern for possible ESBL.  Cultures from last admission with multiple species present.  Repeat urine cultures pending.  Continue antiemetics, pain management, supportive care.  Continue IV Merrem.  Patient  tolerated full liquids and diet has been advanced to a soft diet.  ID consulted and following and appreciate input and recommendations.  2.  hx SVT Continue metoprolol.  Follow.  3.  Poorly controlled diabetes mellitus type 2 Hemoglobin A1c 13.6 (02/28/2020).  CBG of 122 this morning.  Continue home regimen Lantus 15 units daily and meal coverage NovoLog 5 units 3 times daily with meals.  Sliding scale insulin.   4.  Hypothyroidism Synthroid.     DVT prophylaxis: Lovenox Code Status: Full Family Communication: Updated patient and fianc at bedside. Disposition:   Status is: Inpatient    Dispo: The patient is from: Home              Anticipated d/c is to: Home              Anticipated d/c date is: 03/22/2020-03/23/2020              Patient currently pancultured, on IV antibiotics, admitted with recurrent pyelonephritis and gram-negative bacteremia, despite treatment with third-generation cephalosporins.  ID consulted.        Consultants:   ID: Dr. Drue Second 03/19/2020  Urology curb sided by admitting physician.  Procedures:  CT abdomen and pelvis 03/18/2020  Antimicrobials:  IV Merrem 03/19/2020   Subjective: Patient in bed, fianc at bedside.  States flank pain has improved.  Feeling better than she did on admission.  No chest pain.  No shortness of breath.  No nausea or vomiting.  Tolerated full liquid diet.  Soft tray at bedside.  States urinary symptoms have  improved.  Objective: Vitals:   03/20/20 0410 03/20/20 1223 03/20/20 1258 03/20/20 1415  BP: (!) 99/58 120/72  119/78  Pulse: 89   82  Resp: 20   18  Temp: 98.8 F (37.1 C)  99.1 F (37.3 C) 98.7 F (37.1 C)  TempSrc: Oral  Oral Oral  SpO2: 97%   98%  Weight:      Height:        Intake/Output Summary (Last 24 hours) at 03/20/2020 1854 Last data filed at 03/20/2020 1400 Gross per 24 hour  Intake 1744.58 ml  Output 2500 ml  Net -755.42 ml   Filed Weights   03/19/20 2100  Weight: 78.8 kg     Examination:  General exam: NAD Respiratory system: Lungs clear to auscultation bilaterally.  No wheezes, no crackles, no rhonchi.  Normal respiratory effort.  Cardiovascular system: Regular rate rhythm no murmurs rubs or gallops.  No JVD.  No lower extremity edema.  Gastrointestinal system: Abdomen is soft, nontender, nondistended, positive bowel sounds.  Minimal left flank tenderness to palpation.  Central nervous system: Alert and oriented. No focal neurological deficits. Extremities: Symmetric 5 x 5 power. Skin: No rashes, lesions or ulcers Psychiatry: Judgement and insight appear normal. Mood & affect appropriate.     Data Reviewed: I have personally reviewed following labs and imaging studies  CBC: Recent Labs  Lab 03/18/20 2111 03/19/20 0512 03/20/20 0602  WBC 8.3 8.7 5.9  NEUTROABS  --   --  3.8  HGB 9.9* 10.0* 8.9*  HCT 32.1* 32.4* 30.3*  MCV 80.9 80.4 84.6  PLT 243 256 208    Basic Metabolic Panel: Recent Labs  Lab 03/18/20 1928 03/19/20 0512 03/20/20 0602  NA 136 137  --   K 4.3 3.4*  --   CL 99 103  --   CO2 26 26  --   GLUCOSE 238* 201*  --   BUN 11 7  --   CREATININE 0.68 0.59  --   CALCIUM 9.2 8.2*  --   MG  --  1.9 1.9    GFR: Estimated Creatinine Clearance: 85.4 mL/min (by C-G formula based on SCr of 0.59 mg/dL).  Liver Function Tests: Recent Labs  Lab 03/18/20 1928  AST 23  ALT 14  ALKPHOS 144*  BILITOT 0.9  PROT 8.2*  ALBUMIN 3.8    CBG: Recent Labs  Lab 03/20/20 0036 03/20/20 0413 03/20/20 0741 03/20/20 1136 03/20/20 1604  GLUCAP 130* 174* 122* 193* 183*     Recent Results (from the past 240 hour(s))  Urine culture     Status: Abnormal   Collection Time: 03/18/20  8:07 PM   Specimen: In/Out Cath Urine  Result Value Ref Range Status   Specimen Description   Final    IN/OUT CATH URINE Performed at [facility], [address], [place], [place] [address]    Special Requests   Final     NONE Performed at [facility], [address] [place], [place] [address]    Culture MULTIPLE SPECIES PRESENT, SUGGEST RECOLLECTION (A)  Final   Report Status 03/19/2020 FINAL  Final  Blood Culture (routine x 2)     Status: Abnormal (Preliminary result)   Collection Time: 03/18/20  8:21 PM   Specimen: BLOOD  Result Value Ref Range Status   Specimen Description   Final    BLOOD LEFT ANTECUBITAL Performed at [facility], [address] [place], [place] [address]    Special Requests  Final    BOTTLES DRAWN AEROBIC AND ANAEROBIC Blood Culture adequate volume Performed at [facility] [address] [place], [place] [address]    Culture  Setup Time   Final    GRAM NEGATIVE RODS IN BOTH AEROBIC AND ANAEROBIC BOTTLES Organism ID to follow CRITICAL RESULT CALLED TO, READ BACK BY AND VERIFIED WITH: D. Wofford PharmD 16:25 03/19/20 ([email or web address])    Culture (A)  Final    ESCHERICHIA COLI SUSCEPTIBILITIES TO FOLLOW Performed at [facility], [place] [address] [place], [place] [address]    Report Status PENDING  Incomplete  Blood Culture (routine x 2)     Status: None (Preliminary result)   Collection Time: 03/18/20  8:21 PM   Specimen: BLOOD  Result Value Ref Range Status   Specimen Description   Final    BLOOD RIGHT ANTECUBITAL Performed at [facility] [address], [place], [place] [address]    Special Requests   Final    BOTTLES DRAWN AEROBIC AND ANAEROBIC Blood Culture adequate volume   Culture  Setup Time   Final    GRAM NEGATIVE RODS IN BOTH AEROBIC AND ANAEROBIC BOTTLES CRITICAL VALUE NOTED.  VALUE IS CONSISTENT WITH PREVIOUSLY REPORTED AND CALLED VALUE. Performed at [facility], [place] [address] [place], [place] [address]    Culture GRAM NEGATIVE RODS  Final   Report Status PENDING  Incomplete  Blood Culture ID Panel (Reflexed)     Status: Abnormal   Collection Time: 03/18/20  8:21  PM  Result Value Ref Range Status   Enterococcus species NOT DETECTED NOT DETECTED Final   Listeria monocytogenes NOT DETECTED NOT DETECTED Final   Staphylococcus species NOT DETECTED NOT DETECTED Final   Staphylococcus aureus (BCID) NOT DETECTED NOT DETECTED Final   Streptococcus species NOT DETECTED NOT DETECTED Final   Streptococcus agalactiae NOT DETECTED NOT DETECTED Final   Streptococcus pneumoniae NOT DETECTED NOT DETECTED Final   Streptococcus pyogenes NOT DETECTED NOT DETECTED Final   Acinetobacter baumannii NOT DETECTED NOT DETECTED Final   Enterobacteriaceae species DETECTED (A) NOT DETECTED Final    Comment: Enterobacteriaceae represent a large family of gram-negative bacteria, not a single organism. CRITICAL RESULT CALLED TO, READ BACK BY AND VERIFIED WITH: D. Wofford PharmD 16:25 03/19/20 ([email or web address])    Enterobacter cloacae complex NOT DETECTED NOT DETECTED Final   Escherichia coli DETECTED (A) NOT DETECTED Final    Comment: CRITICAL RESULT CALLED TO, READ BACK BY AND VERIFIED WITH: D. Wofford PharmD 16:25 03/19/20 ([email or web address])    Klebsiella oxytoca NOT DETECTED NOT DETECTED Final   Klebsiella pneumoniae NOT DETECTED NOT DETECTED Final   Proteus species NOT DETECTED NOT DETECTED Final   Serratia marcescens NOT DETECTED NOT DETECTED Final   Carbapenem resistance NOT DETECTED NOT DETECTED Final   Haemophilus influenzae NOT DETECTED NOT DETECTED Final   Neisseria meningitidis NOT DETECTED NOT DETECTED Final   Pseudomonas aeruginosa NOT DETECTED NOT DETECTED Final   Candida albicans NOT DETECTED NOT DETECTED Final   Candida glabrata NOT DETECTED NOT DETECTED Final   Candida krusei NOT DETECTED NOT DETECTED Final   Candida parapsilosis NOT DETECTED NOT DETECTED Final   Candida tropicalis NOT DETECTED NOT DETECTED Final    Comment: Performed at [facility], [address], [place], [place] [address]  Culture, Urine     Status: None   Collection Time: 03/19/20  8:45 AM    Specimen: Urine, Catheterized  Result Value Ref Range Status  Specimen Description   Final    URINE, CATHETERIZED Performed at [facility], [address] [place], [place] [address]    Special Requests   Final    NONE Performed at [facility] [address], [place], [place] [address]    Culture   Final    NO GROWTH Performed at [facility], [address], [place], [place] [address]    Report Status 03/20/2020 FINAL  Final         Radiology Studies: CT ABDOMEN PELVIS W CONTRAST  Result Date: 03/18/2020 CLINICAL DATA:  Vomiting. Recent UTI EXAM: CT ABDOMEN AND PELVIS WITH CONTRAST TECHNIQUE: Multidetector CT imaging of the abdomen and pelvis was performed using the standard protocol following bolus administration of intravenous contrast. CONTRAST:  OMNIPAQUE IOHEXOL 300 MG/ML  SOLN COMPARISON:  02/28/2019 FINDINGS: LOWER CHEST: Normal. HEPATOBILIARY: Normal hepatic contours. No intra- or extrahepatic biliary dilatation. The gallbladder is normal. PANCREAS: Normal pancreas. No ductal dilatation or peripancreatic fluid collection. SPLEEN: Normal. ADRENALS/URINARY TRACT: The adrenal glands are normal. Bilateral striated nephrograms with hypoattenuation of the upper renal poles. No hydronephrosis. The urinary bladder is normal for degree of distention STOMACH/BOWEL: There is no hiatal hernia. Normal duodenal course and caliber. No small bowel dilatation or inflammation. No focal colonic abnormality. Normal appendix. VASCULAR/LYMPHATIC: Normal course and caliber of the major abdominal vessels. No abdominal or pelvic lymphadenopathy. REPRODUCTIVE: Normal uterus and ovaries. MUSCULOSKELETAL. No bony spinal canal stenosis or focal osseous abnormality. OTHER: None. IMPRESSION: Bilateral striated nephrograms with hypoattenuation of the upper renal poles, compatible with acute pyelonephritis. Electronically Signed   By: Deatra Robinson M.D.   On:  03/18/2020 21:31        Scheduled Meds: . enoxaparin (LOVENOX) injection  40 mg Subcutaneous Daily  . insulin aspart  0-9 Units Subcutaneous Q4H  . insulin aspart  5 Units Subcutaneous TID WC  . insulin glargine  15 Units Subcutaneous QHS  . levothyroxine  75 mcg Oral QAC breakfast  . loratadine  10 mg Oral Daily  . metoprolol tartrate  50 mg Oral BID   Continuous Infusions: . sodium chloride 125 mL/hr at 03/19/20 2217  . meropenem (MERREM) IV 1 g (03/20/20 1548)     LOS: 2 days    Time spent: 35 minutes    Ramiro Harvest, MD [facility]   To contact the attending provider between 7A-7P or the covering provider during after hours 7P-7A, please log into the web site [email or web address] and access using universal  password for that web site. If you do not have the password, please call the hospital operator.  03/20/2020, 6:54 PM

## 2020-03-21 LAB — CBC WITH DIFFERENTIAL/PLATELET
Abs Immature Granulocytes: 0.02 10*3/uL (ref 0.00–0.07)
Basophils Absolute: 0 10*3/uL (ref 0.0–0.1)
Basophils Relative: 1 %
Eosinophils Absolute: 0.1 10*3/uL (ref 0.0–0.5)
Eosinophils Relative: 3 %
HCT: 28.8 % — ABNORMAL LOW (ref 36.0–46.0)
Hemoglobin: 8.8 g/dL — ABNORMAL LOW (ref 12.0–15.0)
Immature Granulocytes: 1 %
Lymphocytes Relative: 30 %
Lymphs Abs: 1.3 10*3/uL (ref 0.7–4.0)
MCH: 24.9 pg — ABNORMAL LOW (ref 26.0–34.0)
MCHC: 30.6 g/dL (ref 30.0–36.0)
MCV: 81.6 fL (ref 80.0–100.0)
Monocytes Absolute: 0.4 10*3/uL (ref 0.1–1.0)
Monocytes Relative: 10 %
Neutro Abs: 2.3 10*3/uL (ref 1.7–7.7)
Neutrophils Relative %: 55 %
Platelets: 233 10*3/uL (ref 150–400)
RBC: 3.53 MIL/uL — ABNORMAL LOW (ref 3.87–5.11)
RDW: 14.1 % (ref 11.5–15.5)
WBC: 4.2 10*3/uL (ref 4.0–10.5)
nRBC: 0 % (ref 0.0–0.2)

## 2020-03-21 LAB — GLUCOSE, CAPILLARY
Glucose-Capillary: 159 mg/dL — ABNORMAL HIGH (ref 70–99)
Glucose-Capillary: 130 mg/dL — ABNORMAL HIGH (ref 70–99)
Glucose-Capillary: 229 mg/dL — ABNORMAL HIGH (ref 70–99)
Glucose-Capillary: 220 mg/dL — ABNORMAL HIGH (ref 70–99)
Glucose-Capillary: 176 mg/dL — ABNORMAL HIGH (ref 70–99)

## 2020-03-21 LAB — BASIC METABOLIC PANEL
Anion gap: 8 (ref 5–15)
BUN: 6 mg/dL (ref 6–20)
CO2: 26 mmol/L (ref 22–32)
Calcium: 8.2 mg/dL — ABNORMAL LOW (ref 8.9–10.3)
Chloride: 105 mmol/L (ref 98–111)
Creatinine, Ser: 0.5 mg/dL (ref 0.44–1.00)
GFR calc Af Amer: >60 mL/min (ref >60)
GFR calc non Af Amer: >60 mL/min (ref >60)
Glucose, Bld: 161 mg/dL — ABNORMAL HIGH (ref 70–99)
Potassium: 3.6 mmol/L (ref 3.5–5.1)
Sodium: 139 mmol/L (ref 135–145)

## 2020-03-21 LAB — MAGNESIUM: Magnesium: 2 mg/dL (ref 1.7–2.4)

## 2020-03-21 MED ORDER — PANTOPRAZOLE SODIUM 40 MG PO TBEC
40.0000 mg | DELAYED_RELEASE_TABLET | Freq: Two times a day (BID) | ORAL | Status: DC
  Administered 2020-03-21 – 2020-03-22 (×3): 40 mg via ORAL
  Filled 2020-03-21 (×3): qty 1

## 2020-03-21 MED ORDER — LIDOCAINE VISCOUS HCL 2 % MT SOLN
15.0000 mL | Freq: Once | OROMUCOSAL | Status: AC
  Administered 2020-03-21: 15 mL via ORAL
  Filled 2020-03-21: qty 15

## 2020-03-21 MED ORDER — ALUM & MAG HYDROXIDE-SIMETH 200-200-20 MG/5ML PO SUSP
30.0000 mL | Freq: Once | ORAL | Status: AC
  Administered 2020-03-21: 30 mL via ORAL
  Filled 2020-03-21: qty 30

## 2020-03-21 MED ORDER — POTASSIUM CHLORIDE CRYS ER 20 MEQ PO TBCR
40.0000 meq | EXTENDED_RELEASE_TABLET | Freq: Once | ORAL | Status: AC
  Administered 2020-03-21: 40 meq via ORAL
  Filled 2020-03-21: qty 2

## 2020-03-21 NOTE — Progress Notes (Signed)
PROGRESS NOTE    Julia [name]  KGM:[id number] DOB: Oct 17, 1971 DOA: 03/18/2020 PCP: Sherren Mocha, MD    Chief Complaint  Patient presents with  . Emesis    Brief Narrative:  HPI per Dr. Jamal Collin is a 49 y.o. female with medical history significant of HTN, DM2, had COVID in Feb.  Pt admitted with pyelonephritis at end of march, treated with rocephin and discharged on omnicef.  Pt dx with UTI 3 days ago again, N/V onset this morning, unable to keep anything down.  Developed increased B back / flank pain.  Fever up to 101.x at home today, in to ED.  Was on macrobid by PCP.   ED Course: CT scan demonstrates B pyelonephritis findings.  Given dose of rocephin, given 2L IVF bolus.  Urology recd ID consult.   Assessment & Plan:   Principal Problem:   Pyelonephritis Active Problems:   HTN (hypertension)   Type 2 diabetes mellitus with hyperglycemia, without long-term current use of insulin (HCC)   Intractable nausea and vomiting   Hypothyroidism   Bacteremia due to Gram-negative bacteria  1 recurrent acute pyelonephritis/E. coli bacteremia r/o ESBL Patient recently discharged after being admitted and treated for pyelonephritis at the end of March with completion of oral Omnicef on discharge per patient.  Patient presented with nausea vomiting bilateral flank pain, inability to keep anything down, fever with a temperature of 101.  Patient noted to have been on Macrobid by PCP prior to admission.  CT abdomen and pelvis done on admission consistent with bilateral pyelonephritis.  No stones noted, no abscess noted on CT scan.  Patient pancultured and blood cultures preliminary results positive for E. coli bacteremia however finalization and sensitivities pending.  Concern for possible ESBL.  Cultures from last admission with multiple species present.  Repeat urine cultures negative.  Continue antiemetics, pain management, supportive care.  Continue IV Merrem.  Patient  advanced to a regular diet this morning which she tolerated.  Saline lock IV fluids.  ID consulted and following.   2.  hx SVT Continue metoprolol.  Follow.  3.  Poorly controlled diabetes mellitus type 2 Hemoglobin A1c 13.6 (02/28/2020).  CBG of 159 this morning.  Change diet to carb modified diet.  Continue home regimen Lantus 15 units daily and meal coverage NovoLog 5 units 3 times daily with meals.  Sliding scale insulin.  Diabetic coordinator.  4.  Hypothyroidism Continue Synthroid.     DVT prophylaxis: Lovenox Code Status: Full Family Communication: Updated patient.  No family at bedside.  Disposition:   Status is: Inpatient    Dispo: The patient is from: Home              Anticipated d/c is to: Home              Anticipated d/c date is: 03/22/2020-03/23/2020              Patient currently pancultured, on IV antibiotics, admitted with recurrent pyelonephritis and E Coli bacteremia, despite treatment with third-generation cephalosporins.  ID consulted and ff.  May need IV antibiotics.        Consultants:   ID: Dr. Drue Second 03/19/2020  Urology curb sided by admitting physician.  Procedures:  CT abdomen and pelvis 03/18/2020  Antimicrobials:  IV Merrem 03/19/2020   Subjective: Patient standing up in room.  Denies chest pain or shortness of breath.  Feeling much better than she did on admission.  No further nausea or emesis.  Improvement  with urinary symptoms.  Some right flank discomfort.    Objective: Vitals:   03/20/20 1258 03/20/20 1415 03/20/20 2038 03/21/20 0424  BP:  119/78 136/85 111/72  Pulse:  82 (!) 101 82  Resp:  18 20 18   Temp: 99.1 F (37.3 C) 98.7 F (37.1 C) 99.5 F (37.5 C) 99.2 F (37.3 C)  TempSrc: Oral Oral Oral Oral  SpO2:  98% 97% 96%  Weight:      Height:        Intake/Output Summary (Last 24 hours) at 03/21/2020 1010 Last data filed at 03/21/2020 0400 Gross per 24 hour  Intake 140 ml  Output 2700 ml  Net -2560 ml   Filed Weights    03/19/20 2100  Weight: 78.8 kg    Examination:  General exam: NAD Respiratory system: CTAB.  No wheezes, no crackles, no rhonchi.  Normal respiratory effort.  Speaking in full sentences.  Cardiovascular system: RRR no murmurs rubs or gallops.  No JVD.  No lower extremity edema.  Gastrointestinal system: Abdomen is nontender, nondistended, soft, positive bowel sounds.  Right flank discomfort to palpation. Central nervous system: Alert and oriented. No focal neurological deficits. Extremities: Symmetric 5 x 5 power. Skin: No rashes, lesions or ulcers Psychiatry: Judgement and insight appear normal. Mood & affect appropriate.     Data Reviewed: I have personally reviewed following labs and imaging studies  CBC: Recent Labs  Lab 03/18/20 2111 03/19/20 0512 03/20/20 0602 03/21/20 0642  WBC 8.3 8.7 5.9 4.2  NEUTROABS  --   --  3.8 2.3  HGB 9.9* 10.0* 8.9* 8.8*  HCT 32.1* 32.4* 30.3* 28.8*  MCV 80.9 80.4 84.6 81.6  PLT 243 256 208 233    Basic Metabolic Panel: Recent Labs  Lab 03/18/20 1928 03/19/20 0512 03/20/20 0602 03/21/20 0642  NA 136 137 136 139  K 4.3 3.4* 4.0 3.6  CL 99 103 105 105  CO2 26 26 25 26   GLUCOSE 238* 201* 157* 161*  BUN 11 7 7 6   CREATININE 0.68 0.59 0.57 0.50  CALCIUM 9.2 8.2* 8.0* 8.2*  MG  --  1.9 1.9 2.0  PHOS  --   --  2.6  --     GFR: Estimated Creatinine Clearance: 85.4 mL/min (by C-G formula based on SCr of 0.5 mg/dL).  Liver Function Tests: Recent Labs  Lab 03/18/20 1928 03/20/20 0602  AST 23  --   ALT 14  --   ALKPHOS 144*  --   BILITOT 0.9  --   PROT 8.2*  --   ALBUMIN 3.8 2.6*    CBG: Recent Labs  Lab 03/20/20 1604 03/20/20 2040 03/20/20 2356 03/21/20 0422 03/21/20 0726  GLUCAP 183* 194* 208* 159* 130*     Recent Results (from the past 240 hour(s))  Urine culture     Status: Abnormal   Collection Time: 03/18/20  8:07 PM   Specimen: In/Out Cath Urine  Result Value Ref Range Status   Specimen Description    Final    IN/OUT CATH URINE Performed at [facility], [address], [place], [place]    Special Requests   Final    NONE Performed at [facility], [address] [place], [place]    Culture MULTIPLE SPECIES PRESENT, SUGGEST RECOLLECTION (A)  Final   Report Status 03/19/2020 FINAL  Final  Blood Culture (routine x 2)     Status: Abnormal (Preliminary result)   Collection Time: 03/18/20  8:21  PM   Specimen: BLOOD  Result Value Ref Range Status   Specimen Description   Final    BLOOD LEFT ANTECUBITAL Performed at [facility], [address] [place], [place] [address]    Special Requests   Final    BOTTLES DRAWN AEROBIC AND ANAEROBIC Blood Culture adequate volume Performed at [facility], [address] [place], [place] [address]    Culture  Setup Time   Final    GRAM NEGATIVE RODS IN BOTH AEROBIC AND ANAEROBIC BOTTLES Organism ID to follow CRITICAL RESULT CALLED TO, READ BACK BY AND VERIFIED WITH: D. Wofford PharmD 16:25 03/19/20 ([email or web address])    Culture (A)  Final    ESCHERICHIA COLI SUSCEPTIBILITIES TO FOLLOW Performed at [facility], [address], [place], [place] [address]    Report Status PENDING  Incomplete  Blood Culture (routine x 2)     Status: None (Preliminary result)   Collection Time: 03/18/20  8:21 PM   Specimen: BLOOD  Result Value Ref Range Status   Specimen Description   Final    BLOOD RIGHT ANTECUBITAL Performed at [facility], [address], [place], [place] [address]    Special Requests   Final    BOTTLES DRAWN AEROBIC AND ANAEROBIC Blood Culture adequate volume   Culture  Setup Time   Final    GRAM NEGATIVE RODS IN BOTH AEROBIC AND ANAEROBIC BOTTLES CRITICAL VALUE NOTED.  VALUE IS CONSISTENT WITH PREVIOUSLY REPORTED AND CALLED VALUE. Performed at [facility], [address], [place], [place] [address]    Culture GRAM  NEGATIVE RODS  Final   Report Status PENDING  Incomplete  Blood Culture ID Panel (Reflexed)     Status: Abnormal   Collection Time: 03/18/20  8:21 PM  Result Value Ref Range Status   Enterococcus species NOT DETECTED NOT DETECTED Final   Listeria monocytogenes NOT DETECTED NOT DETECTED Final   Staphylococcus species NOT DETECTED NOT DETECTED Final   Staphylococcus aureus (BCID) NOT DETECTED NOT DETECTED Final   Streptococcus species NOT DETECTED NOT DETECTED Final   Streptococcus agalactiae NOT DETECTED NOT DETECTED Final   Streptococcus pneumoniae NOT DETECTED NOT DETECTED Final   Streptococcus pyogenes NOT DETECTED NOT DETECTED Final   Acinetobacter baumannii NOT DETECTED NOT DETECTED Final   Enterobacteriaceae species DETECTED (A) NOT DETECTED Final    Comment: Enterobacteriaceae represent a large family of gram-negative bacteria, not a single organism. CRITICAL RESULT CALLED TO, READ BACK BY AND VERIFIED WITH: D. Wofford PharmD 16:25 03/19/20 ([email or web address])    Enterobacter cloacae complex NOT DETECTED NOT DETECTED Final   Escherichia coli DETECTED (A) NOT DETECTED Final    Comment: CRITICAL RESULT CALLED TO, READ BACK BY AND VERIFIED WITH: D. Wofford PharmD 16:25 03/19/20 ([email or web address])    Klebsiella oxytoca NOT DETECTED NOT DETECTED Final   Klebsiella pneumoniae NOT DETECTED NOT DETECTED Final   Proteus species NOT DETECTED NOT DETECTED Final   Serratia marcescens NOT DETECTED NOT DETECTED Final   Carbapenem resistance NOT DETECTED NOT DETECTED Final   Haemophilus influenzae NOT DETECTED NOT DETECTED Final   Neisseria meningitidis NOT DETECTED NOT DETECTED Final   Pseudomonas aeruginosa NOT DETECTED NOT DETECTED Final   Candida albicans NOT DETECTED NOT DETECTED Final   Candida glabrata NOT DETECTED NOT DETECTED Final   Candida krusei NOT DETECTED NOT DETECTED Final   Candida parapsilosis NOT DETECTED NOT DETECTED Final   Candida tropicalis NOT DETECTED NOT DETECTED Final  Comment:  Performed at [facility], [place] [address], [place], [place] [address]  Culture, Urine     Status: None   Collection Time: 03/19/20  8:45 AM   Specimen: Urine, Catheterized  Result Value Ref Range Status   Specimen Description   Final    URINE, CATHETERIZED Performed at [place] [address], [place], [place] [address]    Special Requests   Final    NONE Performed at [facility], [place] [address] [place], [place] [address]    Culture   Final    NO GROWTH Performed at [facility], [place] [address] [place],  [address]    Report Status 03/20/2020 FINAL  Final         Radiology Studies: No results found.      Scheduled Meds: . enoxaparin (LOVENOX) injection  40 mg Subcutaneous Daily  . insulin aspart  0-9 Units Subcutaneous Q4H  . insulin aspart  5 Units Subcutaneous TID WC  . insulin glargine  15 Units Subcutaneous QHS  . levothyroxine  75 mcg Oral QAC breakfast  . loratadine  10 mg Oral Daily  . metoprolol tartrate  50 mg Oral BID  . potassium chloride  40 mEq Oral Once   Continuous Infusions: . meropenem (MERREM) IV 1 g (03/21/20 5284)     LOS: 3 days    Time spent: 35 minutes    Irine Seal, MD [facility]   To contact the attending provider between 7A-7P or the covering provider during after hours 7P-7A, please log into the web site [email or web address] and access using universal [place] password for that web site. If you do not have the password, please call the hospital operator.  03/21/2020, 10:10 AM

## 2020-03-22 DIAGNOSIS — B9620 Unspecified Escherichia coli [E. coli] as the cause of diseases classified elsewhere: Secondary | ICD-10-CM | POA: Diagnosis present

## 2020-03-22 DIAGNOSIS — R7881 Bacteremia: Secondary | ICD-10-CM | POA: Diagnosis present

## 2020-03-22 LAB — BASIC METABOLIC PANEL
Anion gap: 10 (ref 5–15)
BUN: 8 mg/dL (ref 6–20)
CO2: 25 mmol/L (ref 22–32)
Calcium: 8.9 mg/dL (ref 8.9–10.3)
Chloride: 101 mmol/L (ref 98–111)
Creatinine, Ser: 0.45 mg/dL (ref 0.44–1.00)
GFR calc Af Amer: >60 mL/min (ref >60)
GFR calc non Af Amer: >60 mL/min (ref >60)
Glucose, Bld: 130 mg/dL — ABNORMAL HIGH (ref 70–99)
Potassium: 4.1 mmol/L (ref 3.5–5.1)
Sodium: 136 mmol/L (ref 135–145)

## 2020-03-22 LAB — GLUCOSE, CAPILLARY
Glucose-Capillary: 117 mg/dL — ABNORMAL HIGH (ref 70–99)
Glucose-Capillary: 134 mg/dL — ABNORMAL HIGH (ref 70–99)
Glucose-Capillary: 142 mg/dL — ABNORMAL HIGH (ref 70–99)
Glucose-Capillary: 167 mg/dL — ABNORMAL HIGH (ref 70–99)
Glucose-Capillary: 223 mg/dL — ABNORMAL HIGH (ref 70–99)

## 2020-03-22 LAB — CULTURE, BLOOD (ROUTINE X 2)
Special Requests: ADEQUATE
Special Requests: ADEQUATE

## 2020-03-22 LAB — CBC WITH DIFFERENTIAL/PLATELET
Abs Immature Granulocytes: 0.02 10*3/uL (ref 0.00–0.07)
Basophils Absolute: 0 10*3/uL (ref 0.0–0.1)
Basophils Relative: 1 %
Eosinophils Absolute: 0.2 10*3/uL (ref 0.0–0.5)
Eosinophils Relative: 3 %
HCT: 32.7 % — ABNORMAL LOW (ref 36.0–46.0)
Hemoglobin: 10 g/dL — ABNORMAL LOW (ref 12.0–15.0)
Immature Granulocytes: 0 %
Lymphocytes Relative: 33 %
Lymphs Abs: 1.5 10*3/uL (ref 0.7–4.0)
MCH: 24.8 pg — ABNORMAL LOW (ref 26.0–34.0)
MCHC: 30.6 g/dL (ref 30.0–36.0)
MCV: 81.1 fL (ref 80.0–100.0)
Monocytes Absolute: 0.5 10*3/uL (ref 0.1–1.0)
Monocytes Relative: 12 %
Neutro Abs: 2.3 10*3/uL (ref 1.7–7.7)
Neutrophils Relative %: 51 %
Platelets: 292 10*3/uL (ref 150–400)
RBC: 4.03 MIL/uL (ref 3.87–5.11)
RDW: 14.1 % (ref 11.5–15.5)
WBC: 4.5 10*3/uL (ref 4.0–10.5)
nRBC: 0 % (ref 0.0–0.2)

## 2020-03-22 MED ORDER — LEVOFLOXACIN 750 MG PO TABS: 750.0000 mg | ORAL_TABLET | Freq: Every day | ORAL | 0 refills | Status: AC

## 2020-03-22 MED ORDER — ESOMEPRAZOLE MAGNESIUM 40 MG PO CPDR: 40.0000 mg | DELAYED_RELEASE_CAPSULE | Freq: Every day | ORAL | 0 refills | Status: DC

## 2020-03-22 MED ORDER — LEVOFLOXACIN 750 MG PO TABS
750.0000 mg | ORAL_TABLET | Freq: Every day | ORAL | Status: DC
  Administered 2020-03-22: 750 mg via ORAL
  Filled 2020-03-22: qty 1

## 2020-03-22 NOTE — Progress Notes (Signed)
Inpatient Diabetes Program Recommendations  AACE/ADA: New Consensus Statement on Inpatient Glycemic Control (2015)  Target Ranges:  Prepandial:   less than 140 mg/dL      Peak postprandial:   less than 180 mg/dL (1-2 hours)      Critically ill patients:  140 - 180 mg/dL   Lab Results  Component Value Date   GLUCAP 134 (H) 03/22/2020   HGBA1C 13.6 (H) 02/28/2020    Review of Glycemic Control  Diabetes history: DM 2 Outpatient Diabetes medications: Basaglar 15 units qhs, Novolog 5 units tid meal coverage Current orders for Inpatient glycemic control:  Latnus 15 units qhs Novolog 0-9 units tid  Novolog 5 units tid meal coverage  Consult for DM education A1c 13.6% on 02/28/20.  DM Coordinator spoke with pt on 3/29. Pt was started on insulin at that time.  Current glucose trends in 100 range.  Watch for now.  Thanks,  Christena Deem RN, MSN, BC-ADM Inpatient Diabetes Coordinator Team Pager [phone] (8a-5p)

## 2020-03-22 NOTE — Discharge Summary (Signed)
Physician Discharge Summary  Julia [name] VQM:[id number] DOB: 1971/03/11 DOA: 03/18/2020  PCP: Sherren Mocha, MD  Admit date: 03/18/2020 Discharge date: 03/22/2020  Time spent: 55 minutes  Recommendations for Outpatient Follow-up:  1.  Follow-up with Sherren Mocha, MD in 2 weeks.  On follow-up patient will need a basic metabolic profile done to follow-up on electrolytes and renal function.  Patient's E. coli bacteremia and E. coli pyelonephritis will need to be followed up upon.    Discharge Diagnoses:  Principal Problem:   Pyelonephritis Active Problems:   E coli bacteremia   HTN (hypertension)   Type 2 diabetes mellitus with hyperglycemia, without long-term current use of insulin (HCC)   Intractable nausea and vomiting   Hypothyroidism   Bacteremia due to Gram-negative bacteria   Discharge Condition: Stable and improved  Diet recommendation: Carb modified diet  Filed Weights   03/19/20 2100  Weight: 78.8 kg    History of present illness:  HPI per Dr. Jamal Collin is a 49 y.o. female with medical history significant of HTN, DM2, had COVID in Feb.  Pt admitted with pyelonephritis at end of march, treated with rocephin and discharged on omnicef.  Pt dx with UTI 3 days prior to admission, N/V onset this morning, unable to keep anything down.  Developed increased B back / flank pain.  Fever up to 101.x at home on day of admission, in to ED.  Was on macrobid by PCP.   ED Course: CT scan demonstrated B pyelonephritis findings.  Given dose of rocephin, given 2L IVF bolus.  Urology recd ID consult.  Hospital Course:  1 recurrent acute E. Coli pyelonephritis/E. coli bacteremia, POA Patient recently discharged after being admitted and treated for pyelonephritis at the end of March with completion of oral Omnicef on discharge per patient.  Patient presented with nausea vomiting bilateral flank pain, inability to keep anything down, fever with a temperature of 101.   Patient noted to have been on Macrobid by PCP prior to admission and noted to have recently been diagnosed with a UTI 3 days prior to admission.  CT abdomen and pelvis done on admission consistent with bilateral pyelonephritis.  No stones noted, no abscess noted on CT scan.  Patient pancultured and blood cultures were positive for E. coli.  Patient noted to have E. coli bacteremia.  Initial concern was possible ESBL however sensitivities came back negative for ESBL.  Patient initially placed empirically on IV Merrem.  CT abdomen and pelvis was negative for nephrolithiasis or abscess formation.  ID consulted.  ID follow the patient throughout the hospitalization and recommended 7 more days of oral Levaquin to complete a 10-day course of antibiotic treatment.  Merrem was discontinued and patient received first dose of Levaquin prior to discharge.  Patient was discharged home in stable and improved condition is to follow-up with PCP in the outpatient setting.    2.  hx SVT Remained stable on home regimen of metoprolol.  3.  Poorly controlled diabetes mellitus type 2 Hemoglobin A1c 13.6 (02/28/2020).    Patient was placed on home regimen of Lantus 15 units daily as well as meal coverage NovoLog 3 times daily with meals.  Patient also maintained on sliding scale insulin.  Patient's blood glucose levels remained stable during the hospitalization.  Patient was seen by diabetic coordinator.  Outpatient follow-up with PCP.   4.  Hypothyroidism Patient maintained on home regimen Synthroid.    Procedures:  CT abdomen and pelvis 03/18/2020  Consultations:  ID: Dr. Drue SecondSnider 03/19/2020  Urology curb sided by admitting physician.  Discharge Exam: Vitals:   03/22/20 1417 03/22/20 1542  BP: (!) 145/86   Pulse: 80   Resp: 18   Temp: 98.3 F (36.8 C) 100.1 F (37.8 C)  SpO2: 99%     General: NAD Cardiovascular: RRR Respiratory: CTAB  Discharge Instructions   Discharge Instructions    Diet Carb  Modified   Complete by: As directed    Increase activity slowly   Complete by: As directed      Allergies as of 03/22/2020      Reactions   Sulfa Antibiotics Hives   Sulfur Other (See Comments)   blisters   Trulicity [dulaglutide]    Abdominal pain   Hctz [hydrochlorothiazide] Rash   Possible photosensitivity dermatitis   Invokana [canagliflozin] Rash   Possible photosensitivity dermatitis      Medication List    STOP taking these medications   nitrofurantoin (macrocrystal-monohydrate) 100 MG capsule Commonly known as: MACROBID     TAKE these medications   Basaglar KwikPen 100 UNIT/ML Inject 15 Units into the skin at bedtime.   esomeprazole 40 MG capsule Commonly known as: NexIUM Take 1 capsule (40 mg total) by mouth daily.   glucose blood test strip Commonly known as: ONE TOUCH ULTRA TEST USE AS DIRECTED   insulin aspart 100 UNIT/ML FlexPen Commonly known as: NOVOLOG Inject 5 Units into the skin 3 (three) times daily with meals.   Insulin Pen Needle 31G X 5 MM Misc 5 Units by Does not apply route in the morning, at noon, and at bedtime.   ipratropium 0.06 % nasal spray Commonly known as: ATROVENT Place 2 sprays into both nostrils daily as needed for congestion.   levofloxacin 750 MG tablet Commonly known as: LEVAQUIN Take 1 tablet (750 mg total) by mouth daily for 6 days. Start taking on: March 23, 2020   levothyroxine 75 MCG tablet Commonly known as: SYNTHROID TAKE 1 TABLET BY MOUTH EVERY DAY What changed: when to take this   loratadine 10 MG tablet Commonly known as: CLARITIN Take 10 mg by mouth daily.   metoprolol tartrate 50 MG tablet Commonly known as: LOPRESSOR Take 50 mg by mouth 2 (two) times daily.   ondansetron 4 MG tablet Commonly known as: ZOFRAN Take 1 tablet (4 mg total) by mouth every 6 (six) hours as needed for nausea.   potassium chloride SA 20 MEQ tablet Commonly known as: KLOR-CON Take 1 tablet (20 mEq total) by mouth  daily.   propranolol 10 MG tablet Commonly known as: INDERAL Take 10 mg by mouth 4 (four) times daily as needed (heart papatations).      Allergies  Allergen Reactions  . Sulfa Antibiotics Hives  . Sulfur Other (See Comments)    blisters  . Trulicity [Dulaglutide]     Abdominal pain  . Hctz [Hydrochlorothiazide] Rash    Possible photosensitivity dermatitis  . Invokana [Canagliflozin] Rash    Possible photosensitivity dermatitis   Follow-up Information    Sherren MochaShaw, Eva N, MD. Schedule an appointment as soon as possible for a visit in 2 week(s).   Specialty: Family Medicine Contact information: [address] [place] [address] [phone]        Nahser, Deloris PingPhilip J, MD .   Specialty: Cardiology Contact information: [address] [place] [address] [phone]            The results of significant diagnostics from this hospitalization (including  imaging, microbiology, ancillary and laboratory) are listed below for reference.    Significant Diagnostic Studies: CT ABDOMEN PELVIS W CONTRAST  Result Date: 03/18/2020 CLINICAL DATA:  Vomiting. Recent UTI EXAM: CT ABDOMEN AND PELVIS WITH CONTRAST TECHNIQUE: Multidetector CT imaging of the abdomen and pelvis was performed using the standard protocol following bolus administration of intravenous contrast. CONTRAST:  OMNIPAQUE IOHEXOL 300 MG/ML  SOLN COMPARISON:  02/28/2019 FINDINGS: LOWER CHEST: Normal. HEPATOBILIARY: Normal hepatic contours. No intra- or extrahepatic biliary dilatation. The gallbladder is normal. PANCREAS: Normal pancreas. No ductal dilatation or peripancreatic fluid collection. SPLEEN: Normal. ADRENALS/URINARY TRACT: The adrenal glands are normal. Bilateral striated nephrograms with hypoattenuation of the upper renal poles. No hydronephrosis. The urinary bladder is normal for degree of distention STOMACH/BOWEL: There is no hiatal hernia. Normal duodenal course and caliber. No small bowel  dilatation or inflammation. No focal colonic abnormality. Normal appendix. VASCULAR/LYMPHATIC: Normal course and caliber of the major abdominal vessels. No abdominal or pelvic lymphadenopathy. REPRODUCTIVE: Normal uterus and ovaries. MUSCULOSKELETAL. No bony spinal canal stenosis or focal osseous abnormality. OTHER: None. IMPRESSION: Bilateral striated nephrograms with hypoattenuation of the upper renal poles, compatible with acute pyelonephritis. Electronically Signed   By: Deatra Robinson M.D.   On: 03/18/2020 21:31   CT Abdomen Pelvis W Contrast  Result Date: 02/28/2020 CLINICAL DATA:  Nausea and vomiting, bloody emesis, right upper quadrant pain EXAM: CT ABDOMEN AND PELVIS WITH CONTRAST TECHNIQUE: Multidetector CT imaging of the abdomen and pelvis was performed using the standard protocol following bolus administration of intravenous contrast. CONTRAST:  OMNIPAQUE IOHEXOL 300 MG/ML  SOLN COMPARISON:  02/28/2020 FINDINGS: Lower chest: No acute pleural or parenchymal lung disease. Hepatobiliary: There is mild fatty infiltration of the liver. Minimal gallbladder sludge is identified without cholelithiasis or cholecystitis. No biliary dilation. Pancreas: Unremarkable. No pancreatic ductal dilatation or surrounding inflammatory changes. Spleen: There is borderline enlargement the spleen measuring 13.4 cm in craniocaudal length. No focal splenic abnormalities. Adrenals/Urinary Tract: There is markedly heterogeneous enhancement of the bilateral kidneys consistent with multifocal bilateral pyelonephritis. I do not see any evidence of renal abscess at this time. No obstructive uropathy. No urinary tract calculi. The bladder is unremarkable. Adrenals are normal. Stomach/Bowel: No bowel obstruction or ileus. Normal appendix right lower quadrant. Minimal colonic diverticulosis without diverticulitis. No bowel wall thickening or inflammatory changes. Vascular/Lymphatic: No significant vascular findings are present.  No enlarged abdominal or pelvic lymph nodes. Reproductive: Uterus and bilateral adnexa are unremarkable. Other: No abdominal wall hernia or abnormality. No abdominopelvic ascites. Musculoskeletal: No acute or destructive bony lesions. Reconstructed images demonstrate no additional findings. IMPRESSION: 1. Bilateral multifocal pyelonephritis without evidence of renal abscess. 2. Mild fatty infiltration of the liver. 3. Borderline splenomegaly. 4. Minimal colonic diverticulosis without diverticulitis. Electronically Signed   By: Sharlet Salina M.D.   On: 02/28/2020 15:33   DG CHEST PORT 1 VIEW  Result Date: 02/28/2020 CLINICAL DATA:  Sepsis EXAM: PORTABLE CHEST 1 VIEW COMPARISON:  01/22/2020 FINDINGS: Single frontal view of the chest demonstrates a stable cardiac silhouette. No airspace disease, effusion, or pneumothorax. Lung volumes are diminished. IMPRESSION: 1. Low lung volumes, no acute process. Electronically Signed   By: Sharlet Salina M.D.   On: 02/28/2020 21:02   US Abdomen Limited RUQ  Result Date: 02/28/2020 CLINICAL DATA:  Vomiting for 6 weeks. EXAM: ULTRASOUND ABDOMEN LIMITED RIGHT UPPER QUADRANT COMPARISON:  None. FINDINGS: Gallbladder: There is a small amount of sludge in the gallbladder. There is a comet tail sign in the fundus  which could represent minimal adenomyomatosis. No stones. Gallbladder wall thickness is normal. Common bile duct: Diameter: 4 mm, normal. Liver: Diffuse hepatic steatosis with focal fatty sparing adjacent to the gallbladder. No liver masses. Portal vein is patent on color Doppler imaging with normal direction of blood flow towards the liver. Other: None. IMPRESSION: 1. No acute abnormality. 2. Hepatic steatosis. Electronically Signed   By: Lorriane Shire M.D.   On: 02/28/2020 13:54    Microbiology: Recent Results (from the past 240 hour(s))  Urine culture     Status: Abnormal   Collection Time: 03/18/20  8:07 PM   Specimen: In/Out Cath Urine  Result Value Ref  Range Status   Specimen Description   Final    IN/OUT CATH URINE Performed at [facility], [place] [address] [place], [place] [address]    Special Requests   Final    NONE Performed at [facility], [place] [address], [place], [place] [address]    Culture MULTIPLE SPECIES PRESENT, SUGGEST RECOLLECTION (A)  Final   Report Status 03/19/2020 FINAL  Final  Blood Culture (routine x 2)     Status: Abnormal   Collection Time: 03/18/20  8:21 PM   Specimen: BLOOD  Result Value Ref Range Status   Specimen Description   Final    BLOOD LEFT ANTECUBITAL Performed at [facility] [address], [place], [place] [address]    Special Requests   Final    BOTTLES DRAWN AEROBIC AND ANAEROBIC Blood Culture adequate volume Performed at [facility] [address], [place], [place] [address]    Culture  Setup Time   Final    GRAM NEGATIVE RODS IN BOTH AEROBIC AND ANAEROBIC BOTTLES CRITICAL RESULT CALLED TO, READ BACK BY AND VERIFIED WITH: D. Wofford PharmD 16:25 03/19/20 ([email or web address]) Performed at [facility], [place] [address] [place], [place] [address]    Culture ESCHERICHIA COLI (A)  Final   Report Status 03/22/2020 FINAL  Final   Organism ID, Bacteria ESCHERICHIA COLI  Final      Susceptibility   Escherichia coli - MIC*    AMPICILLIN >=32 RESISTANT Resistant     CEFAZOLIN <=4 SENSITIVE Sensitive     CEFEPIME <=0.12 SENSITIVE Sensitive     CEFTAZIDIME <=1 SENSITIVE Sensitive     CEFTRIAXONE <=0.25 SENSITIVE Sensitive     CIPROFLOXACIN <=0.25 SENSITIVE Sensitive     GENTAMICIN <=1 SENSITIVE Sensitive     IMIPENEM <=0.25 SENSITIVE Sensitive     TRIMETH/SULFA <=20 SENSITIVE Sensitive     AMPICILLIN/SULBACTAM >=32 RESISTANT Resistant     PIP/TAZO <=4 SENSITIVE Sensitive     * ESCHERICHIA COLI  Blood Culture (routine x 2)     Status: Abnormal   Collection Time: 03/18/20  8:21 PM   Specimen: BLOOD  Result Value Ref  Range Status   Specimen Description   Final    BLOOD RIGHT ANTECUBITAL Performed at [facility] [address], [place], [place] [address]    Special Requests   Final    BOTTLES DRAWN AEROBIC AND ANAEROBIC Blood Culture adequate volume   Culture  Setup Time   Final    GRAM NEGATIVE RODS IN BOTH AEROBIC AND ANAEROBIC BOTTLES CRITICAL VALUE NOTED.  VALUE IS CONSISTENT WITH PREVIOUSLY REPORTED AND CALLED VALUE. Performed at [facility], [place] [address] [place], [place] [address]    Culture ESCHERICHIA COLI (A)  Final   Report Status 03/22/2020 FINAL  Final  Organism ID, Bacteria ESCHERICHIA COLI  Final      Susceptibility   Escherichia coli - MIC*    AMPICILLIN >=32 RESISTANT Resistant     CEFAZOLIN <=4 SENSITIVE Sensitive     CEFEPIME <=0.12 SENSITIVE Sensitive     CEFTAZIDIME <=1 SENSITIVE Sensitive     CEFTRIAXONE <=0.25 SENSITIVE Sensitive     CIPROFLOXACIN <=0.25 SENSITIVE Sensitive     GENTAMICIN <=1 SENSITIVE Sensitive     IMIPENEM <=0.25 SENSITIVE Sensitive     TRIMETH/SULFA <=20 SENSITIVE Sensitive     AMPICILLIN/SULBACTAM >=32 RESISTANT Resistant     PIP/TAZO <=4 SENSITIVE Sensitive     * ESCHERICHIA COLI  Blood Culture ID Panel (Reflexed)     Status: Abnormal   Collection Time: 03/18/20  8:21 PM  Result Value Ref Range Status   Enterococcus species NOT DETECTED NOT DETECTED Final   Listeria monocytogenes NOT DETECTED NOT DETECTED Final   Staphylococcus species NOT DETECTED NOT DETECTED Final   Staphylococcus aureus (BCID) NOT DETECTED NOT DETECTED Final   Streptococcus species NOT DETECTED NOT DETECTED Final   Streptococcus agalactiae NOT DETECTED NOT DETECTED Final   Streptococcus pneumoniae NOT DETECTED NOT DETECTED Final   Streptococcus pyogenes NOT DETECTED NOT DETECTED Final   Acinetobacter baumannii NOT DETECTED NOT DETECTED Final   Enterobacteriaceae species DETECTED (A) NOT DETECTED Final    Comment: Enterobacteriaceae represent  a large family of gram-negative bacteria, not a single organism. CRITICAL RESULT CALLED TO, READ BACK BY AND VERIFIED WITH: D. Wofford PharmD 16:25 03/19/20 ([email or web address])    Enterobacter cloacae complex NOT DETECTED NOT DETECTED Final   Escherichia coli DETECTED (A) NOT DETECTED Final    Comment: CRITICAL RESULT CALLED TO, READ BACK BY AND VERIFIED WITH: D. Wofford PharmD 16:25 03/19/20 ([email or web address])    Klebsiella oxytoca NOT DETECTED NOT DETECTED Final   Klebsiella pneumoniae NOT DETECTED NOT DETECTED Final   Proteus species NOT DETECTED NOT DETECTED Final   Serratia marcescens NOT DETECTED NOT DETECTED Final   Carbapenem resistance NOT DETECTED NOT DETECTED Final   Haemophilus influenzae NOT DETECTED NOT DETECTED Final   Neisseria meningitidis NOT DETECTED NOT DETECTED Final   Pseudomonas aeruginosa NOT DETECTED NOT DETECTED Final   Candida albicans NOT DETECTED NOT DETECTED Final   Candida glabrata NOT DETECTED NOT DETECTED Final   Candida krusei NOT DETECTED NOT DETECTED Final   Candida parapsilosis NOT DETECTED NOT DETECTED Final   Candida tropicalis NOT DETECTED NOT DETECTED Final    Comment: Performed at [facility], [address], [place], [place] [address]  Culture, Urine     Status: None   Collection Time: 03/19/20  8:45 AM   Specimen: Urine, Catheterized  Result Value Ref Range Status   Specimen Description   Final    URINE, CATHETERIZED Performed at [facility], [address], [place], [place] [address]    Special Requests   Final    NONE Performed at [facility], [address] [place], [place] [address]    Culture   Final    NO GROWTH Performed at [facility], [address], [place], [place] [address]    Report Status 03/20/2020 FINAL  Final     Labs: Basic Metabolic Panel: Recent Labs  Lab 03/18/20 1928 03/19/20 0512 03/20/20 0602 03/21/20 0642 03/22/20 0554  NA 136 137 136 139 136  K 4.3 3.4* 4.0 3.6  4.1  CL 99 103 105 105 101  CO2 26 26 25  26  25  GLUCOSE 238* 201* 157* 161* 130*  BUN 11 7 7 6 8   CREATININE 0.68 0.59 0.57 0.50 0.45  CALCIUM 9.2 8.2* 8.0* 8.2* 8.9  MG  --  1.9 1.9 2.0  --   PHOS  --   --  2.6  --   --    Liver Function Tests: Recent Labs  Lab 03/18/20 1928 03/20/20 0602  AST 23  --   ALT 14  --   ALKPHOS 144*  --   BILITOT 0.9  --   PROT 8.2*  --   ALBUMIN 3.8 2.6*   Recent Labs  Lab 03/18/20 1928  LIPASE 20   No results for input(s): AMMONIA in the last 168 hours. CBC: Recent Labs  Lab 03/18/20 2111 03/19/20 0512 03/20/20 0602 03/21/20 0642 03/22/20 0554  WBC 8.3 8.7 5.9 4.2 4.5  NEUTROABS  --   --  3.8 2.3 2.3  HGB 9.9* 10.0* 8.9* 8.8* 10.0*  HCT 32.1* 32.4* 30.3* 28.8* 32.7*  MCV 80.9 80.4 84.6 81.6 81.1  PLT 243 256 208 233 292   Cardiac Enzymes: No results for input(s): CKTOTAL, CKMB, CKMBINDEX, TROPONINI in the last 168 hours. BNP: BNP (last 3 results) No results for input(s): BNP in the last 8760 hours.  ProBNP (last 3 results) No results for input(s): PROBNP in the last 8760 hours.  CBG: Recent Labs  Lab 03/21/20 2054 03/22/20 0019 03/22/20 0426 03/22/20 0752 03/22/20 1133  GLUCAP 176* 142* 117* 134* 223*       Signed:  03/24/20 MD.  [facility] 03/22/2020, 5:33 PM

## 2020-03-22 NOTE — Progress Notes (Signed)
Pharmacy Antibiotic Note  Julia [name] is a 49 y.o. female admitted on 03/18/2020 with recurrent pyelonephritis with secondary bacteremia. Pharmacy has been consulted for meropenem dosing.  Plan: Remains on Meropenem 1g IV q8h Blood cultures finalized-not ESBL organism Follow ID recommendations on antibiotic choice/duration   Height: 5\' 4"  (162.6 cm) Weight: 78.8 kg (173 lb 11.6 oz) IBW/kg (Calculated) : 54.7  Temp (24hrs), Avg:99.3 F (37.4 C), Min:98.5 F (36.9 C), Max:100.2 F (37.9 C)  Recent Labs  Lab 03/18/20 1928 03/18/20 2021 03/18/20 2111 03/18/20 2205 03/19/20 0512 03/20/20 0602 03/21/20 0642 03/22/20 0554  WBC  --   --  8.3  --  8.7 5.9 4.2 4.5  CREATININE 0.68  --   --   --  0.59 0.57 0.50 0.45  LATICACIDVEN  --  1.7  --  1.0  --   --   --   --     Estimated Creatinine Clearance: 85.4 mL/min (by C-G formula based on SCr of 0.45 mg/dL).    Allergies  Allergen Reactions  . Sulfa Antibiotics Hives  . Sulfur Other (See Comments)    blisters  . Trulicity [Dulaglutide]     Abdominal pain  . Hctz [Hydrochlorothiazide] Rash    Possible photosensitivity dermatitis  . Invokana [Canagliflozin] Rash    Possible photosensitivity dermatitis    Antimicrobials this admission: 4/15 Ceftriaxone x 1 4/15 Meropenem >>   Microbiology results: 4/15 BCx: 4/4 bottles E.coli (R ampicillin, unasyn)  4/15 UCx:  multiple species, suggest recollection 4/16 UCx: NGF  Thank you for allowing pharmacy to be a part of this patient's care.  5/16 03/22/2020 2:13 PM

## 2020-03-22 NOTE — Progress Notes (Signed)
ID PROGRESS NOTE  Ecoli pyelonephritis with secondary bacteremia  S:afebrile  Micro:  AMPICILLIN >=32 RESIST... Resistant     AMPICILLIN/SULBACTAM >=32 RESIST... Resistant    CEFAZOLIN <=4 SENSITIVE  Sensitive    CEFEPIME <=0.12 SENS... Sensitive    CEFTAZIDIME <=1 SENSITIVE  Sensitive    CEFTRIAXONE <=0.25 SENS... Sensitive    CIPROFLOXACIN <=0.25 SENS... Sensitive    GENTAMICIN <=1 SENSITIVE  Sensitive    IMIPENEM <=0.25 SENS... Sensitive    PIP/TAZO <=4 SENSITIVE  Sensitive    TRIMETH/SULFA <=20 SENSIT... Sensitive    A/P: ecoli pyelonephritis with bacteremia  -will recommend 7 days of levofloxacin 750mg  daily - will d/c meropenem - prior to being admitted had been on cephalosporin/has hives to sulfa - will sign off

## 2020-03-23 ENCOUNTER — Ambulatory Visit: Payer: 59 | Admitting: Sports Medicine

## 2020-04-07 ENCOUNTER — Encounter: Payer: Self-pay | Admitting: Dietician

## 2020-04-07 ENCOUNTER — Other Ambulatory Visit: Payer: Self-pay

## 2020-04-07 ENCOUNTER — Ambulatory Visit: Payer: 59 | Admitting: Internal Medicine

## 2020-04-07 ENCOUNTER — Encounter: Payer: Self-pay | Admitting: Internal Medicine

## 2020-04-07 ENCOUNTER — Encounter: Payer: 59 | Attending: Internal Medicine | Admitting: Dietician

## 2020-04-07 VITALS — BP 132/87 | HR 78 | Temp 97.9°F | Ht 64.0 in | Wt 173.0 lb

## 2020-04-07 DIAGNOSIS — E1165 Type 2 diabetes mellitus with hyperglycemia: Secondary | ICD-10-CM | POA: Diagnosis present

## 2020-04-07 DIAGNOSIS — N12 Tubulo-interstitial nephritis, not specified as acute or chronic: Secondary | ICD-10-CM

## 2020-04-07 DIAGNOSIS — R7881 Bacteremia: Secondary | ICD-10-CM

## 2020-04-07 MED ORDER — PHENAZOPYRIDINE HCL 95 MG PO TABS: 95.0000 mg | ORAL_TABLET | Freq: Three times a day (TID) | ORAL | 0 refills | Status: DC | PRN

## 2020-04-07 NOTE — Progress Notes (Signed)
Patient ID: Julia [name], female   DOB: 03-19-1971, 49 y.o.   MRN: [id number]  HPI 49yo F with recent diagnosis multiple bouts of urinary tract infection starting in February. Including pyelonephritis with secondary bacteremia. In February, had covid in the setting of UTI and diabetes was not well controlled (only had been on orals). Readmitted in March, for pyelo as well as in April. She finished 14 day course of abtx for ecoli pyelo with bacteremia and .Finished levofloxacin on 4/25  Started having discomfort again but not painful urination 4/28-4/30: Wed-thur-fri of last week (given concurrent cipro and ceftriaxone) -  Tested urine and wbc on 4/29. Normal WBC, ua showed 1+LE, no bacturia. WBC 10-20 on microscopy. Unclear why no urine cx done. But also wasn't at the start of reinitiation  ---- Still has mild right flank discomfort. She feels that she has pressure when emptying her bladder --that is very mild. No blood urine, no dysuria. She has not been referred to urology yet.  She drinks 2-3 bottles of water and 2-3 cups of cranberry juice Goes to endocrinologist at [facility] to start management of diabetes. Still in low 200s, average 230.  In February back to BS 400s.   Outpatient Encounter Medications as of 04/07/2020  Medication Sig  . esomeprazole (NEXIUM) 40 MG capsule Take 1 capsule (40 mg total) by mouth daily.  . fluticasone (FLONASE) 50 MCG/ACT nasal spray Place into both nostrils daily.  . furosemide (LASIX) 40 MG tablet Take 20-40 mg by mouth daily as needed.  Marland Kitchen glucose blood (ONE TOUCH ULTRA TEST) test strip USE AS DIRECTED  . insulin aspart (NOVOLOG) 100 UNIT/ML FlexPen Inject 5 Units into the skin 3 (three) times daily with meals. (Patient taking differently: Inject 5-10 Units into the skin 3 (three) times daily with meals. )  . Insulin Glargine (BASAGLAR KWIKPEN) 100 UNIT/ML Inject 15 Units into the skin at bedtime.   . Insulin Pen Needle 31G X 5 MM MISC 5 Units by Does not  apply route in the morning, at noon, and at bedtime.  Marland Kitchen ipratropium (ATROVENT) 0.06 % nasal spray Place 2 sprays into both nostrils daily as needed for congestion.  Marland Kitchen levothyroxine (SYNTHROID, LEVOTHROID) 75 MCG tablet TAKE 1 TABLET BY MOUTH EVERY DAY (Patient taking differently: Take 75 mcg by mouth daily before breakfast. )  . loratadine (CLARITIN) 10 MG tablet Take 10 mg by mouth daily.  . metFORMIN (GLUCOPHAGE) 1000 MG tablet Take 1,000 mg by mouth 2 (two) times daily with a meal.  . metoprolol tartrate (LOPRESSOR) 50 MG tablet Take 50 mg by mouth 2 (two) times daily.  . potassium chloride SA (K-DUR,KLOR-CON) 20 MEQ tablet Take 1 tablet (20 mEq total) by mouth daily.  . sucralfate (CARAFATE) 1 g tablet Take 1 g by mouth in the morning, at noon, and at bedtime.  . cefTRIAXone (ROCEPHIN) 1 g injection SMARTSIG:1 Gram(s) IM Every Night  . ciprofloxacin (CIPRO) 500 MG tablet Take 500 mg by mouth 2 (two) times daily.  . ondansetron (ZOFRAN) 4 MG tablet Take 1 tablet (4 mg total) by mouth every 6 (six) hours as needed for nausea. (Patient not taking: Reported on 04/07/2020)  . propranolol (INDERAL) 10 MG tablet Take 10 mg by mouth 4 (four) times daily as needed (heart papatations).    No facility-administered encounter medications on file as of 04/07/2020.     Patient Active Problem List   Diagnosis Date Noted  . E coli bacteremia 03/22/2020  . Bacteremia  due to Gram-negative bacteria 03/20/2020  . Hypothyroidism   . Pyelonephritis 02/28/2020  . Intractable nausea and vomiting 02/28/2020  . Nasal sinus congestion 03/15/2018  . Subclinical hypothyroidism 08/05/2017  . Hyperlipidemia 07/28/2016  . Type 2 diabetes mellitus with hyperglycemia, without long-term current use of insulin (HCC) 04/15/2016  . Sinus tachycardia 11/12/2015  . S/P radiofrequency ablation operation for arrhythmia 02/24/2015  . SVT (supraventricular tachycardia) (HCC) 08/13/2014  . Palpitations 08/13/2014  . Obesity, Class  I, BMI 30-34.9 09/30/2013  . HTN (hypertension) 08/01/2012     Health Maintenance Due  Topic Date Due  . PNEUMOCOCCAL POLYSACCHARIDE VACCINE AGE 24-64 HIGH RISK  Never done  . COVID-19 Vaccine (1) Never done  . TETANUS/TDAP  Never done  . OPHTHALMOLOGY EXAM  07/28/2016  . FOOT EXAM  06/09/2018  . URINE MICROALBUMIN  06/09/2018  . MAMMOGRAM  Never done  . COLONOSCOPY  Never done     Review of Systems 12 point ros is negative except what is mentioned above Physical Exam   BP 132/87   Pulse 78   Temp 97.9 F (36.6 C) (Oral)   Ht 5\' 4"  (1.626 m)   Wt 173 lb (78.5 kg)   LMP 07/22/2019   SpO2 100%   BMI 29.70 kg/m   gen = axo by 3 in NAD HEENt = NCAT, moist mucosal membranes, no thrush Ext: no signs of rash CBC Lab Results  Component Value Date   WBC 4.5 03/22/2020   RBC 4.03 03/22/2020   HGB 10.0 (L) 03/22/2020   HCT 32.7 (L) 03/22/2020   PLT 292 03/22/2020   MCV 81.1 03/22/2020   MCH 24.8 (L) 03/22/2020   MCHC 30.6 03/22/2020   RDW 14.1 03/22/2020   LYMPHSABS 1.5 03/22/2020   MONOABS 0.5 03/22/2020   EOSABS 0.2 03/22/2020    BMET Lab Results  Component Value Date   NA 136 03/22/2020   K 4.1 03/22/2020   CL 101 03/22/2020   CO2 25 03/22/2020   GLUCOSE 130 (H) 03/22/2020   BUN 8 03/22/2020   CREATININE 0.45 03/22/2020   CALCIUM 8.9 03/22/2020   GFRNONAA >60 03/22/2020   GFRAA >60 03/22/2020      Assessment and Plan   Has had recurrent bouts of uti, complicated with pyelonephritis and most recently bacteremia since February in setting of poorly controlled DM.two of her recent hospitalizations she became acutely ill with sepsis on presentation shortly after symptom onset which makes her anxious.  Also I am concern that she is getting numerous abtx courses not always documenting that she has uti. Some of her symptoms suggest more of bladder pressure/discomfort. Will refer her to see  Urologist is key for evaluation of bladder emptying studies.  Do a  trial of pyridium to see if helps with discomfort  Continue with activia probiotics in yogurt supplementaton  She is going out of town this week and she is very concerned about needing to seek medical care.gave rx for ua and urine culture if she has dysuria,tocall if she is going to fill keflex but also recommended to seek care for evaluation if she develops fever and flank pain.  Spent 45 min with patient in discussing treatment plan

## 2020-04-07 NOTE — Progress Notes (Signed)
Diabetes Self-Management Education  Visit Type: First/Initial  Appt. Start Time: 7:30am  Appt. End Time: 8:30am  04/07/2020  Julia [name], identified by name and date of birth, is a 49 y.o. female with a diagnosis of Diabetes: Type 2.   ASSESSMENT  Patient states she has had diabetes for about 20 years now. Her primary concern is knowing what to eat and finding strategies to help control her blood sugar which she states has been high lately. Most recent A1c was 13.6% in March at which time she was hospitalized. States she has historically only taken oral meds but is now on insulin (Basaglar 15 units nightly and Novolog 5 units 3 times/day.)   Has a busy lifestyle, traveling a lot with her teenage son who does junior Diplomatic Services operational officer. States she needs help with knowing the best things to eat while on the go and eating out. Thinks she needs to watch carbs and figure out how to best balance them with her mealtime insulin. Recently started checking blood sugar: fasting, before lunch, and before dinner. States she is starting to recognize which foods have the greatest raising effect on her blood sugar, such as corn, biscuits, fruit, and rice. Typical meal pattern is 3 meals plus 2 snacks per day (states she "grazes.") States she likes bread, primarily for the texture. Other common/liked foods include cooked veggies, fruit, chicken, chips, "crunchy" snacks, avocado, and almonds.     Diabetes Self-Management Education - 04/07/20 0737      Visit Information   Visit Type  First/Initial      Initial Visit   Diabetes Type  Type 2    Are you currently following a meal plan?  No    Are you taking your medications as prescribed?  Yes    Date Diagnosed  2000      Health Coping   How would you rate your overall health?  Fair      Psychosocial Assessment   Patient Belief/Attitude about Diabetes  Defeat/Burnout    Self-care barriers  None    Self-management support  Family    Other persons present   Patient    Patient Concerns  Nutrition/Meal planning;Glycemic Control    Special Needs  None    Preferred Learning Style  No preference indicated    Learning Readiness  Ready    How often do you need to have someone help you when you read instructions, pamphlets, or other written materials from your doctor or pharmacy?  1 - Never    What is the last grade level you completed in school?  college      Complications   Last HgB A1C per patient/outside source  13.6 %   02/28/20   How often do you check your blood sugar?  3-4 times/day    Fasting Blood glucose range (mg/dL)  180-200;>200;130-179    Postprandial Blood glucose range (mg/dL)  180-200;>200   checks before meals, not after   Number of hypoglycemic episodes per month  1    Can you tell when your blood sugar is low?  Yes   shaky, couldn't think clearly   What do you do if your blood sugar is low?  drink a soda or take glucose tablets    Number of hyperglycemic episodes per week  0    Have you had a dilated eye exam in the past 12 months?  No   December 2019   Have you had a dental exam in the past 12 months?  Yes    Are you checking your feet?  Yes    How many days per week are you checking your feet?  4      Dietary Intake   Breakfast  BEC biscuit   or 2-3 egg omelet w/ cheese and veggies; or [place] bacon + egg + fruit   Snack (morning)  almonds    Lunch  [employer] chicken salad   or homemade chicken salad w/ mayo; or fast food   Snack (afternoon)  peanut butter   or sugar-free jello; or applesauce; or candy; or corn chips   Dinner  baked chicken + mashed potatoes + green beans   or spaghetti; or pork loins + corn/sweet potatoes + lima beans/baked carrots/zucchini/squash; or chicken quesadilla w/ side salad   Snack (evening)  ice cream    Beverage(s)  unsweet tea w/ splenda (or stevia); water      Exercise   Exercise Type  ADL's      Patient Education   Previous Diabetes Education  Yes (please comment)   5+ years ago    Disease state   Explored patient's options for treatment of their diabetes    Nutrition management   Role of diet in the treatment of diabetes and the relationship between the three main macronutrients and blood glucose level;Carbohydrate counting;Reviewed blood glucose goals for pre and post meals and how to evaluate the patients' food intake on their blood glucose level.;Meal timing in regards to the patients' current diabetes medication.;Information on hints to eating out and maintain blood glucose control.    Monitoring  Purpose and frequency of SMBG.;Taught/discussed recording of test results and interpretation of SMBG.    Acute complications  Taught treatment of hypoglycemia - the 15 rule.;Discussed and identified patients' treatment of hyperglycemia.    Psychosocial adjustment  Worked with patient to identify barriers to care and solutions;Helped patient identify a support system for diabetes management;Travel strategies      Individualized Goals (developed by patient)   Nutrition  Follow meal plan discussed   2-4 carb choices/meal, 0-2 carb choices/snack, protein with all meals/snacks   Medications  take my medication as prescribed    Monitoring   test my blood glucose as discussed      Outcomes   Expected Outcomes  Demonstrated interest in learning. Expect positive outcomes    Future DMSE  PRN       Individualized Plan for Diabetes Self-Management Training:   Learning Objective:  Patient will have a greater understanding of diabetes self-management. Patient education plan is to attend individual and/or group sessions per assessed needs and concerns.   Plan:  Patient Instructions   Aim for 2-4 carb choices (30-60 grams of carbohydrates) per meal.   Aim for 0-2 carb choices (0-30 grams of carbohydrates) per snack.   Try to include a source of protein with all meals and snacks.    Expected Outcomes:  Demonstrated interest in learning. Expect positive outcomes  Education  material provided: Meal plan card, My Plate, Snack sheet and Carbohydrate counting sheet, Breakfast ideas, Tips for eating out with diabetes  If problems or questions, patient to contact team via:  Phone and Email  Future DSME appointment: PRN

## 2020-04-07 NOTE — Patient Instructions (Signed)
   Aim for 2-4 carb choices (30-60 grams of carbohydrates) per meal.   Aim for 0-2 carb choices (0-30 grams of carbohydrates) per snack.   Try to include a source of protein with all meals and snacks.

## 2020-04-22 ENCOUNTER — Encounter: Payer: Self-pay | Admitting: Family Medicine

## 2020-04-30 ENCOUNTER — Ambulatory Visit (HOSPITAL_COMMUNITY)
Admission: EM | Admit: 2020-04-30 | Discharge: 2020-04-30 | Disposition: A | Discharge status: Home / Self Care | Payer: 59 | Attending: Family Medicine | Admitting: Family Medicine

## 2020-04-30 ENCOUNTER — Emergency Department (HOSPITAL_COMMUNITY)
Admission: EM | Admit: 2020-04-30 | Discharge: 2020-04-30 | Disposition: A | Discharge status: Home / Self Care | Payer: 59 | Attending: Emergency Medicine | Admitting: Emergency Medicine

## 2020-04-30 ENCOUNTER — Encounter (HOSPITAL_COMMUNITY): Payer: Self-pay | Admitting: Emergency Medicine

## 2020-04-30 ENCOUNTER — Encounter (HOSPITAL_COMMUNITY): Payer: Self-pay

## 2020-04-30 ENCOUNTER — Other Ambulatory Visit: Payer: Self-pay

## 2020-04-30 DIAGNOSIS — R509 Fever, unspecified: Secondary | ICD-10-CM | POA: Insufficient documentation

## 2020-04-30 DIAGNOSIS — Z5321 Procedure and treatment not carried out due to patient leaving prior to being seen by health care provider: Secondary | ICD-10-CM | POA: Diagnosis not present

## 2020-04-30 DIAGNOSIS — R109 Unspecified abdominal pain: Secondary | ICD-10-CM | POA: Insufficient documentation

## 2020-04-30 DIAGNOSIS — R1032 Left lower quadrant pain: Secondary | ICD-10-CM | POA: Insufficient documentation

## 2020-04-30 DIAGNOSIS — R1031 Right lower quadrant pain: Secondary | ICD-10-CM | POA: Diagnosis present

## 2020-04-30 DIAGNOSIS — R300 Dysuria: Secondary | ICD-10-CM | POA: Diagnosis present

## 2020-04-30 LAB — URINALYSIS, ROUTINE W REFLEX MICROSCOPIC
Bilirubin Urine: NEGATIVE
Glucose, UA: >=500 mg/dL — AB
Ketones, ur: NEGATIVE mg/dL
Nitrite: NEGATIVE
Protein, ur: NEGATIVE mg/dL
Specific Gravity, Urine: 1.013 (ref 1.005–1.030)
WBC, UA: >50 WBC/hpf — ABNORMAL HIGH (ref 0–5)
pH: 5 (ref 5.0–8.0)

## 2020-04-30 LAB — POCT URINALYSIS DIP (DEVICE)
Bilirubin Urine: NEGATIVE
Glucose, UA: 500 mg/dL — AB
Ketones, ur: NEGATIVE mg/dL
Nitrite: NEGATIVE
Protein, ur: NEGATIVE mg/dL
Specific Gravity, Urine: 1.015 (ref 1.005–1.030)
Urobilinogen, UA: 0.2 mg/dL (ref 0.0–1.0)
pH: 5.5 (ref 5.0–8.0)

## 2020-04-30 MED ORDER — ACETAMINOPHEN 325 MG PO TABS
650.0000 mg | ORAL_TABLET | Freq: Four times a day (QID) | ORAL | Status: DC | PRN
  Administered 2020-04-30: 650 mg via ORAL

## 2020-04-30 MED ORDER — ACETAMINOPHEN 325 MG PO TABS
ORAL_TABLET | ORAL | Status: AC
  Filled 2020-04-30: qty 2

## 2020-04-30 NOTE — ED Triage Notes (Signed)
Pt reports hx kidney infections. Reports having bilat flank pains. Started Cipro last night for her symptoms. Had fever earlier today last took tylenol at 1230pm.

## 2020-04-30 NOTE — ED Provider Notes (Signed)
[facility]   CC: UTI  SUBJECTIVE:  Julia [name] is a 49 y.o. female who complains of urinary frequency, urgency and dysuria, fever, and flank pain for the past day. Patient denies a precipitating event, recent sexual encounter, excessive caffeine intake. Localizes the pain to the lower abdomen and right flank. Pain is constant and describes it as sharp and burning. Has tried OTC medications without relief.  Symptoms are made worse with urination.  Admits to similar symptoms in the past. She has had recurrent bouts of UTI complicated by pyelonephritis and e coli sepsis. She had Covid in 01/2020 and was also septic with e coli at that time. She is currently being followed by infectious disease and has urology appt next month. Denies  nausea, vomiting, abnormal vaginal discharge or bleeding, hematuria.    LMP: Patient's last menstrual period was 07/22/2019.  ROS: As in HPI.  All other pertinent ROS negative.     Past Medical History:  Diagnosis Date  . Complication of anesthesia    I had a reaction to a epidural "broke out"  . Condyloma acuminatum of vulva   . COVID-19   . GERD (gastroesophageal reflux disease)   . History of supraventricular tachycardia   . HSV-1 infection   . HSV-2 infection   . HTN (hypertension)   . Hyperlipidemia   . Type 2 diabetes mellitus ([place])    Past Surgical History:  Procedure Laterality Date  . CARDIOVASCULAR STRESS TEST  06-04-2007   normal nuclear study/  no ischemia/  normal LVF, ef 63%  . CESAREAN SECTION  09-06-2005  . LASER ABLATION CONDOLAMATA N/A 10/08/2015   Procedure: CO2 LASER ABLATION CONDOLAMATA OF VULVA;  Surgeon: Molli Posey, MD;  Location: [facility];  Service: Gynecology;  Laterality: N/A;  . SUPRAVENTRICULAR TACHYCARDIA ABLATION N/A 02/24/2015   Procedure: SUPRAVENTRICULAR TACHYCARDIA ABLATION;  Surgeon: Evans Lance, MD;  Location: [facility] CATH LAB;  Service: Cardiovascular;  Laterality: N/A;  . TRANSTHORACIC  ECHOCARDIOGRAM  01-06-2009   normal LVF, ef 55-60%,  trace MR and TR/  normal stress echo   Allergies  Allergen Reactions  . Sulfa Antibiotics Hives  . Sulfur Other (See Comments)    blisters  . Trulicity [Dulaglutide]     Abdominal pain  . Hctz [Hydrochlorothiazide] Rash    Possible photosensitivity dermatitis  . Invokana [Canagliflozin] Rash    Possible photosensitivity dermatitis   No current facility-administered medications on file prior to encounter.   Current Outpatient Medications on File Prior to Encounter  Medication Sig Dispense Refill  . empagliflozin (JARDIANCE) 10 MG TABS tablet Take by mouth daily.    . cefTRIAXone (ROCEPHIN) 1 g injection SMARTSIG:1 Gram(s) IM Every Night    . ciprofloxacin (CIPRO) 500 MG tablet Take 500 mg by mouth 2 (two) times daily.    Marland Kitchen esomeprazole (NEXIUM) 40 MG capsule Take 1 capsule (40 mg total) by mouth daily. 30 capsule 0  . fluticasone (FLONASE) 50 MCG/ACT nasal spray Place into both nostrils daily.    . furosemide (LASIX) 40 MG tablet Take 20-40 mg by mouth daily as needed.    Marland Kitchen glucose blood (ONE TOUCH ULTRA TEST) test strip USE AS DIRECTED 200 each 3  . insulin aspart (NOVOLOG) 100 UNIT/ML FlexPen Inject 5 Units into the skin 3 (three) times daily with meals. (Patient taking differently: Inject 5-10 Units into the skin 3 (three) times daily with meals. ) 15 mL 11  . Insulin Glargine (BASAGLAR KWIKPEN) 100 UNIT/ML Inject 15  Units into the skin at bedtime.     . Insulin Pen Needle 31G X 5 MM MISC 5 Units by Does not apply route in the morning, at noon, and at bedtime. 200 each 3  . ipratropium (ATROVENT) 0.06 % nasal spray Place 2 sprays into both nostrils daily as needed for congestion.    Marland Kitchen levothyroxine (SYNTHROID, LEVOTHROID) 75 MCG tablet TAKE 1 TABLET BY MOUTH EVERY DAY (Patient taking differently: Take 75 mcg by mouth daily before breakfast. ) 90 tablet 1  . loratadine (CLARITIN) 10 MG tablet Take 10 mg by mouth daily.    .  metFORMIN (GLUCOPHAGE) 1000 MG tablet Take 1,000 mg by mouth 2 (two) times daily with a meal.    . metoprolol tartrate (LOPRESSOR) 50 MG tablet Take 50 mg by mouth 2 (two) times daily.    . ondansetron (ZOFRAN) 4 MG tablet Take 1 tablet (4 mg total) by mouth every 6 (six) hours as needed for nausea. (Patient not taking: Reported on 04/07/2020) 20 tablet 0  . phenazopyridine (PYRIDIUM) 95 MG tablet Take 1 tablet (95 mg total) by mouth 3 (three) times daily as needed for pain (up to 2 days for symptom relief). 30 tablet 0  . potassium chloride SA (K-DUR,KLOR-CON) 20 MEQ tablet Take 1 tablet (20 mEq total) by mouth daily. 90 tablet 1  . propranolol (INDERAL) 10 MG tablet Take 10 mg by mouth 4 (four) times daily as needed (heart papatations).     . sucralfate (CARAFATE) 1 g tablet Take 1 g by mouth in the morning, at noon, and at bedtime.     Social History   Socioeconomic History  . Marital status: Significant Other    Spouse name: Not on file  . Number of children: Not on file  . Years of education: Not on file  . Highest education level: Not on file  Occupational History  . Not on file  Tobacco Use  . Smoking status: Former Smoker    Years: 8.00    Types: Cigarettes    Quit date: 12/16/2002    Years since quitting: 17.3  . Smokeless tobacco: Never Used  Substance and Sexual Activity  . Alcohol use: Yes    Alcohol/week: 2.0 standard drinks    Types: 2 Standard drinks or equivalent per week    Comment: rare  . Drug use: No  . Sexual activity: Yes    Birth control/protection: None  Other Topics Concern  . Not on file  Social History Narrative  . Not on file   Social Determinants of Health   Financial Resource Strain:   . Difficulty of Paying Living Expenses:   Food Insecurity:   . Worried About Programme researcher, broadcasting/film/video in the Last Year:   . Barista in the Last Year:   Transportation Needs:   . Freight forwarder (Medical):   Marland Kitchen Lack of Transportation (Non-Medical):     Physical Activity:   . Days of Exercise per Week:   . Minutes of Exercise per Session:   Stress:   . Feeling of Stress :   Social Connections:   . Frequency of Communication with Friends and Family:   . Frequency of Social Gatherings with Friends and Family:   . Attends Religious Services:   . Active Member of Clubs or Organizations:   . Attends Banker Meetings:   Marland Kitchen Marital Status:   Intimate Partner Violence:   . Fear of Current or Ex-Partner:   .  Emotionally Abused:   Marland Kitchen Physically Abused:   . Sexually Abused:    Family History  Problem Relation Age of Onset  . Hypertension Mother   . Thyroid disease Mother   . Diabetes Mother   . Thyroid disease Sister   . Diabetes Maternal Grandmother   . Hyperlipidemia Other   . Thyroid disease Other     OBJECTIVE:  Vitals:   04/30/20 1746  BP: 126/80  Pulse: 92  Resp: 20  Temp: 99 F (37.2 C)  TempSrc: Oral  SpO2: 100%   General appearance: AOx3 in no acute distress, appears fatigued HEENT: NCAT.  Oropharynx clear.  Lungs: clear to auscultation bilaterally without adventitious breath sounds Heart: regular rate and rhythm.  Radial pulses 2+ symmetrical bilaterally Abdomen: soft; non-distended;  Suprapubic tenderness; bowel sounds present; no guarding or rebound tenderness Back: CVA tenderness present Extremities: no edema; symmetrical with no gross deformities Skin: warm and dry Neurologic: Ambulates from chair to exam table without difficulty Psychological: alert and cooperative; normal mood and affect  Labs Reviewed  URINALYSIS, ROUTINE W REFLEX MICROSCOPIC - Abnormal; Notable for the following components:      Result Value   APPearance CLOUDY (*)    Glucose, UA >=500 (*)    Hgb urine dipstick SMALL (*)    Leukocytes,Ua LARGE (*)    WBC, UA >50 (*)    Bacteria, UA RARE (*)    All other components within normal limits  POCT URINALYSIS DIP (DEVICE) - Abnormal; Notable for the following components:    Glucose, UA 500 (*)    Hgb urine dipstick TRACE (*)    Leukocytes,Ua SMALL (*)    All other components within normal limits  URINE CULTURE    ASSESSMENT & PLAN:  1. Flank pain   2. Dysuria   3. Fever, unspecified fever cause     Meds ordered this encounter  Medications  . DISCONTD: acetaminophen (TYLENOL) tablet 650 mg     UA in office as above, as well as microscopy Urine culture sent.  We will call you with abnormal results that need further treatment.  Will go ahead and treat with Cipro today given her complicated UTI and sepsis history. Push fluids and get plenty of rest.   Take antibiotic as directed and to completion Take pyridium as prescribed and as needed for symptomatic relief Follow up with PCP if symptoms persists Return here or go to ER if you have any new or worsening symptoms such as fever, worsening abdominal pain, nausea/vomiting, flank pain  Outlined signs and symptoms indicating need for more acute intervention. Patient verbalized understanding. After Visit Summary given.       Moshe Cipro, NP 05/01/20 2033

## 2020-04-30 NOTE — ED Triage Notes (Addendum)
Pt reports fever 101.2 F today. Bilateral flank pain and pressure and the end of the urine stream x 1 day. Pt took  Ciprofloxacin 500 mg 1 tablet last night and 1 tablet this morning.

## 2020-04-30 NOTE — Discharge Instructions (Addendum)
I have sent Cipro in to your pharmacy for you to take twice daily  Your urine microscopy and urine culture results will be available in MyChart.  If there are abnormal results that require further treatment, we will be in contact with you.

## 2020-05-02 LAB — URINE CULTURE: Culture: 10000 — AB

## 2020-05-03 ENCOUNTER — Telehealth (HOSPITAL_COMMUNITY): Payer: Self-pay | Admitting: Orthopedic Surgery

## 2020-05-03 MED ORDER — CIPROFLOXACIN HCL 500 MG PO TABS: 500.0000 mg | ORAL_TABLET | Freq: Two times a day (BID) | ORAL | 0 refills | Status: DC

## 2020-05-03 NOTE — Telephone Encounter (Signed)
Patient called Medicine never went to pharmacy Still hs sx Culture was nonspecific Will send the cipro Follow up this week recomended

## 2020-05-12 ENCOUNTER — Ambulatory Visit: Payer: 59 | Admitting: Internal Medicine

## 2020-05-24 ENCOUNTER — Encounter (HOSPITAL_COMMUNITY): Payer: Self-pay

## 2020-05-24 ENCOUNTER — Ambulatory Visit (HOSPITAL_COMMUNITY)
Admission: EM | Admit: 2020-05-24 | Discharge: 2020-05-24 | Disposition: A | Discharge status: Home / Self Care | Payer: 59 | Attending: Family Medicine | Admitting: Family Medicine

## 2020-05-24 ENCOUNTER — Other Ambulatory Visit: Payer: Self-pay

## 2020-05-24 DIAGNOSIS — Z792 Long term (current) use of antibiotics: Secondary | ICD-10-CM | POA: Insufficient documentation

## 2020-05-24 DIAGNOSIS — Z79899 Other long term (current) drug therapy: Secondary | ICD-10-CM | POA: Diagnosis not present

## 2020-05-24 DIAGNOSIS — Z79890 Hormone replacement therapy (postmenopausal): Secondary | ICD-10-CM | POA: Diagnosis not present

## 2020-05-24 DIAGNOSIS — Z833 Family history of diabetes mellitus: Secondary | ICD-10-CM | POA: Diagnosis not present

## 2020-05-24 DIAGNOSIS — Z8616 Personal history of COVID-19: Secondary | ICD-10-CM | POA: Diagnosis not present

## 2020-05-24 DIAGNOSIS — J209 Acute bronchitis, unspecified: Secondary | ICD-10-CM | POA: Diagnosis present

## 2020-05-24 DIAGNOSIS — Z794 Long term (current) use of insulin: Secondary | ICD-10-CM | POA: Insufficient documentation

## 2020-05-24 DIAGNOSIS — Z20822 Contact with and (suspected) exposure to covid-19: Secondary | ICD-10-CM | POA: Insufficient documentation

## 2020-05-24 DIAGNOSIS — E785 Hyperlipidemia, unspecified: Secondary | ICD-10-CM | POA: Insufficient documentation

## 2020-05-24 DIAGNOSIS — Z882 Allergy status to sulfonamides status: Secondary | ICD-10-CM | POA: Diagnosis not present

## 2020-05-24 DIAGNOSIS — J069 Acute upper respiratory infection, unspecified: Secondary | ICD-10-CM | POA: Diagnosis not present

## 2020-05-24 DIAGNOSIS — K219 Gastro-esophageal reflux disease without esophagitis: Secondary | ICD-10-CM | POA: Insufficient documentation

## 2020-05-24 DIAGNOSIS — Z87891 Personal history of nicotine dependence: Secondary | ICD-10-CM | POA: Insufficient documentation

## 2020-05-24 DIAGNOSIS — Z8349 Family history of other endocrine, nutritional and metabolic diseases: Secondary | ICD-10-CM | POA: Insufficient documentation

## 2020-05-24 DIAGNOSIS — Z8249 Family history of ischemic heart disease and other diseases of the circulatory system: Secondary | ICD-10-CM | POA: Insufficient documentation

## 2020-05-24 DIAGNOSIS — I10 Essential (primary) hypertension: Secondary | ICD-10-CM | POA: Diagnosis not present

## 2020-05-24 DIAGNOSIS — E119 Type 2 diabetes mellitus without complications: Secondary | ICD-10-CM | POA: Insufficient documentation

## 2020-05-24 DIAGNOSIS — J40 Bronchitis, not specified as acute or chronic: Secondary | ICD-10-CM | POA: Diagnosis not present

## 2020-05-24 DIAGNOSIS — N390 Urinary tract infection, site not specified: Secondary | ICD-10-CM | POA: Insufficient documentation

## 2020-05-24 LAB — SARS CORONAVIRUS 2 (TAT 6-24 HRS): SARS Coronavirus 2: NEGATIVE

## 2020-05-24 MED ORDER — ALBUTEROL SULFATE HFA 108 (90 BASE) MCG/ACT IN AERS: 2.0000 | INHALATION_SPRAY | RESPIRATORY_TRACT | 0 refills | Status: DC | PRN

## 2020-05-24 MED ORDER — PREDNISONE 10 MG (21) PO TBPK: ORAL_TABLET | Freq: Every day | ORAL | 0 refills | Status: AC

## 2020-05-24 NOTE — ED Triage Notes (Signed)
C/o sore throat that started a few days ago, followed by runny nose and now cough. Had COVID in February and got an antibody infusion.

## 2020-05-24 NOTE — ED Provider Notes (Signed)
[facility]   [id number] 05/24/20 Arrival Time: 5400   CC: COVID symptoms  SUBJECTIVE: History from: patient.  Julia [name] is a 49 y.o. female who presents with abrupt onset of nasal congestion, SOB, wheezing, PND, and persistent dry cough for 3 days. Has been taking Cipro for UTI and is on day 4 of 7. Denies sick exposure to COVID, flu or strep. Has had Covid in 02/21, also had antibody infusion as treatment. Has not had Covid vaccines. Denies recent travel. Has tried mucinex without relief. There are no aggravating symptoms. Reports previous symptoms in the past. Has history of bronchitis.  Denies fever, chills, fatigue, sinus pain, rhinorrhea, chest pain, nausea, changes in bowel or bladder habits.    ROS: As per HPI.  All other pertinent ROS negative.     Past Medical History:  Diagnosis Date  . Complication of anesthesia    I had a reaction to a epidural "broke out"  . Condyloma acuminatum of vulva   . COVID-19   . GERD (gastroesophageal reflux disease)   . History of supraventricular tachycardia   . HSV-1 infection   . HSV-2 infection   . HTN (hypertension)   . Hyperlipidemia   . Type 2 diabetes mellitus ([place])    Past Surgical History:  Procedure Laterality Date  . CARDIOVASCULAR STRESS TEST  06-04-2007   normal nuclear study/  no ischemia/  normal LVF, ef 63%  . CESAREAN SECTION  09-06-2005  . LASER ABLATION CONDOLAMATA N/A 10/08/2015   Procedure: CO2 LASER ABLATION CONDOLAMATA OF VULVA;  Surgeon: Molli Posey, MD;  Location: [place];  Service: Gynecology;  Laterality: N/A;  . SUPRAVENTRICULAR TACHYCARDIA ABLATION N/A 02/24/2015   Procedure: SUPRAVENTRICULAR TACHYCARDIA ABLATION;  Surgeon: Evans Lance, MD;  Location: [facility] C[facility] LAB;  Service: Cardiovascular;  Laterality: N/A;  . TRANSTHORACIC ECHOCARDIOGRAM  01-06-2009   normal LVF, ef 55-60%,  trace MR and TR/  normal stress echo   Allergies  Allergen Reactions  . Sulfa Antibiotics  Hives  . Sulfur Other (See Comments)    blisters  . Trulicity [Dulaglutide]     Abdominal pain  . Hctz [Hydrochlorothiazide] Rash    Possible photosensitivity dermatitis  . Invokana [Canagliflozin] Rash    Possible photosensitivity dermatitis   No current facility-administered medications on file prior to encounter.   Current Outpatient Medications on File Prior to Encounter  Medication Sig Dispense Refill  . cefTRIAXone (ROCEPHIN) 1 g injection SMARTSIG:1 Gram(s) IM Every Night    . ciprofloxacin (CIPRO) 500 MG tablet Take 1 tablet (500 mg total) by mouth 2 (two) times daily. 14 tablet 0  . empagliflozin (JARDIANCE) 10 MG TABS tablet Take by mouth daily.    Marland Kitchen esomeprazole (NEXIUM) 40 MG capsule Take 1 capsule (40 mg total) by mouth daily. 30 capsule 0  . fluticasone (FLONASE) 50 MCG/ACT nasal spray Place into both nostrils daily.    . furosemide (LASIX) 40 MG tablet Take 20-40 mg by mouth daily as needed.    Marland Kitchen glucose blood (ONE TOUCH ULTRA TEST) test strip USE AS DIRECTED 200 each 3  . insulin aspart (NOVOLOG) 100 UNIT/ML FlexPen Inject 5 Units into the skin 3 (three) times daily with meals. (Patient taking differently: Inject 5-10 Units into the skin 3 (three) times daily with meals. ) 15 mL 11  . Insulin Glargine (BASAGLAR KWIKPEN) 100 UNIT/ML Inject 15 Units into the skin at bedtime.     . Insulin Pen Needle 31G X 5 MM MISC  5 Units by Does not apply route in the morning, at noon, and at bedtime. 200 each 3  . ipratropium (ATROVENT) 0.06 % nasal spray Place 2 sprays into both nostrils daily as needed for congestion.    Marland Kitchen levothyroxine (SYNTHROID, LEVOTHROID) 75 MCG tablet TAKE 1 TABLET BY MOUTH EVERY DAY (Patient taking differently: Take 75 mcg by mouth daily before breakfast. ) 90 tablet 1  . loratadine (CLARITIN) 10 MG tablet Take 10 mg by mouth daily.    . metFORMIN (GLUCOPHAGE) 1000 MG tablet Take 1,000 mg by mouth 2 (two) times daily with a meal.    . metoprolol tartrate  (LOPRESSOR) 50 MG tablet Take 50 mg by mouth 2 (two) times daily.    . ondansetron (ZOFRAN) 4 MG tablet Take 1 tablet (4 mg total) by mouth every 6 (six) hours as needed for nausea. (Patient not taking: Reported on 04/07/2020) 20 tablet 0  . phenazopyridine (PYRIDIUM) 95 MG tablet Take 1 tablet (95 mg total) by mouth 3 (three) times daily as needed for pain (up to 2 days for symptom relief). 30 tablet 0  . potassium chloride SA (K-DUR,KLOR-CON) 20 MEQ tablet Take 1 tablet (20 mEq total) by mouth daily. 90 tablet 1  . propranolol (INDERAL) 10 MG tablet Take 10 mg by mouth 4 (four) times daily as needed (heart papatations).     . sucralfate (CARAFATE) 1 g tablet Take 1 g by mouth in the morning, at noon, and at bedtime.     Social History   Socioeconomic History  . Marital status: Significant Other    Spouse name: Not on file  . Number of children: Not on file  . Years of education: Not on file  . Highest education level: Not on file  Occupational History  . Not on file  Tobacco Use  . Smoking status: Former Smoker    Years: 8.00    Types: Cigarettes    Quit date: 12/16/2002    Years since quitting: 17.4  . Smokeless tobacco: Never Used  Vaping Use  . Vaping Use: Never used  Substance and Sexual Activity  . Alcohol use: Yes    Alcohol/week: 2.0 standard drinks    Types: 2 Standard drinks or equivalent per week    Comment: rare  . Drug use: No  . Sexual activity: Yes    Birth control/protection: None  Other Topics Concern  . Not on file  Social History Narrative  . Not on file   Social Determinants of Health   Financial Resource Strain:   . Difficulty of Paying Living Expenses:   Food Insecurity:   . Worried About Programme researcher, broadcasting/film/video in the Last Year:   . Barista in the Last Year:   Transportation Needs:   . Freight forwarder (Medical):   Marland Kitchen Lack of Transportation (Non-Medical):   Physical Activity:   . Days of Exercise per Week:   . Minutes of Exercise per  Session:   Stress:   . Feeling of Stress :   Social Connections:   . Frequency of Communication with Friends and Family:   . Frequency of Social Gatherings with Friends and Family:   . Attends Religious Services:   . Active Member of Clubs or Organizations:   . Attends Banker Meetings:   Marland Kitchen Marital Status:   Intimate Partner Violence:   . Fear of Current or Ex-Partner:   . Emotionally Abused:   Marland Kitchen Physically Abused:   . Sexually  Abused:    Family History  Problem Relation Age of Onset  . Hypertension Mother   . Thyroid disease Mother   . Diabetes Mother   . Thyroid disease Sister   . Diabetes Maternal Grandmother   . Hyperlipidemia Other   . Thyroid disease Other     OBJECTIVE:  Vitals:   05/24/20 1606  BP: 126/87  Pulse: 94  Resp: 16  Temp: 98.7 F (37.1 C)  TempSrc: Oral  SpO2: 100%     General appearance: alert; appears fatigued, but nontoxic; speaking in full sentences and tolerating own secretions HEENT: NCAT; Ears: EACs clear, TMs pearly gray; Eyes: PERRL.  EOM grossly intact. Sinuses: nontender; Nose: nares patent without rhinorrhea, Throat: oropharynx clear, tonsils non erythematous or enlarged, uvula midline  Neck: supple without LAD Lungs: unlabored respirations, symmetrical air entry; cough: mild; no respiratory distress; wheezing to right lower lobe, otherwise CTA Heart: regular rate and rhythm.  Radial pulses 2+ symmetrical bilaterally Skin: warm and dry Psychological: alert and cooperative; normal mood and affect  LABS:  No results found for this or any previous visit (from the past 24 hour(s)).   ASSESSMENT & PLAN:  1. Bronchitis   2. Upper respiratory tract infection, unspecified type     Meds ordered this encounter  Medications  . albuterol (VENTOLIN HFA) 108 (90 Base) MCG/ACT inhaler    Sig: Inhale 2 puffs into the lungs every 4 (four) hours as needed for wheezing or shortness of breath.    Dispense:  18 g    Refill:  0     Order Specific Question:   Supervising Provider    Answer:   Merrilee Jansky [id number]  . predniSONE (STERAPRED UNI-PAK 21 TAB) 10 MG (21) TBPK tablet    Sig: Take by mouth daily for 6 days. Take 6 tablets on day 1, 5 tablets on day 2, 4 tablets on day 3, 3 tablets on day 4, 2 tablets on day 5, 1 tablet on day 6    Dispense:  21 tablet    Refill:  0    Order Specific Question:   Supervising Provider    Answer:   Merrilee Jansky [id number]    Acute Bronchitis URI  Prescribed albuterol inhaler Prescribed prednisone taper Continue Cipro as prescribed  COVID testing ordered.  It will take between 1-2 days for test results.  Someone will contact you regarding abnormal results.    Patient should remain in quarantine until they have received Covid results.  If negative you may resume normal activities (go back to work/school) while practicing hand hygiene, social distance, and mask wearing.  If positive, patient should remain in quarantine for 10 days from symptom onset AND greater than 72 hours after symptoms resolution (absence of fever without the use of fever-reducing medication and improvement in respiratory symptoms), whichever is longer Get plenty of rest and push fluids Use OTC zyrtec for nasal congestion, runny nose, and/or sore throat Use OTC flonase for nasal congestion and runny nose Use medications daily for symptom relief Use OTC medications like ibuprofen or tylenol as needed fever or pain Call or go to the ED if you have any new or worsening symptoms such as fever, worsening cough, shortness of breath, chest tightness, chest pain, turning blue, changes in mental status.  Reviewed expectations re: course of current medical issues. Questions answered. Outlined signs and symptoms indicating need for more acute intervention. Patient verbalized understanding. After Visit Summary given.  Moshe Cipro, NP 05/24/20 1709

## 2020-05-24 NOTE — Discharge Instructions (Addendum)
Your COVID test is pending.  You should self quarantine until the test result is back.    Take Tylenol as needed for fever or discomfort.  Rest and keep yourself hydrated.    Go to the emergency department if you develop shortness of breath, severe diarrhea, high fever not relieved by Tylenol or ibuprofen, or other concerning symptoms.    

## 2020-05-30 ENCOUNTER — Other Ambulatory Visit: Payer: Self-pay | Admitting: Cardiovascular Disease

## 2020-05-31 MED ORDER — METOPROLOL TARTRATE 50 MG PO TABS: 50.0000 mg | ORAL_TABLET | Freq: Two times a day (BID) | ORAL | 0 refills | Status: DC

## 2020-06-01 ENCOUNTER — Ambulatory Visit: Payer: 59 | Admitting: Internal Medicine

## 2020-06-14 ENCOUNTER — Ambulatory Visit (INDEPENDENT_AMBULATORY_CARE_PROVIDER_SITE_OTHER): Payer: 59 | Admitting: Internal Medicine

## 2020-06-14 ENCOUNTER — Other Ambulatory Visit: Payer: Self-pay

## 2020-06-14 ENCOUNTER — Encounter: Payer: Self-pay | Admitting: Internal Medicine

## 2020-06-14 VITALS — BP 137/89 | HR 81 | Wt 181.0 lb

## 2020-06-14 DIAGNOSIS — N390 Urinary tract infection, site not specified: Secondary | ICD-10-CM | POA: Diagnosis not present

## 2020-06-14 NOTE — Progress Notes (Signed)
RFV: hospital follow up for recurrent uti  Patient ID: Julia [name], female   DOB: 04/21/1971, 49 y.o.   MRN: [id number]  HPI 49yo F with recurrent uti. Has had 2 bouts of uti since may 2021. Last had symptoms last week, started on cephalexin - for 7 days but f feels more- was in [place] seeing her son compete in [employer].  Last Tuesday (sometimes temp of 100F), painful end of urinary stream, urinary urgency when it happens., "it feels like its leaking".   Urodynamics last week, and sees dr bell on Wednesday  Not having sex often ( 4 times in last 5 months), no douching  Wears Cotton underwear and occasional liner  No rawness or painful sex    Outpatient Encounter Medications as of 06/14/2020  Medication Sig   albuterol (VENTOLIN HFA) 108 (90 Base) MCG/ACT inhaler Inhale 2 puffs into the lungs every 4 (four) hours as needed for wheezing or shortness of breath.   cephALEXin (KEFLEX) 500 MG capsule Take 500 mg by mouth 4 (four) times daily.   empagliflozin (JARDIANCE) 10 MG TABS tablet Take by mouth daily.   esomeprazole (NEXIUM) 40 MG capsule Take 1 capsule (40 mg total) by mouth daily.   fluticasone (FLONASE) 50 MCG/ACT nasal spray Place into both nostrils daily.   furosemide (LASIX) 40 MG tablet Take 20-40 mg by mouth daily as needed.   glucose blood (ONE TOUCH ULTRA TEST) test strip USE AS DIRECTED   insulin aspart (NOVOLOG) 100 UNIT/ML FlexPen Inject 5 Units into the skin 3 (three) times daily with meals. (Patient taking differently: Inject 5-10 Units into the skin 3 (three) times daily with meals. )   Insulin Glargine (BASAGLAR KWIKPEN) 100 UNIT/ML Inject 15 Units into the skin at bedtime.    Insulin Pen Needle 31G X 5 MM MISC 5 Units by Does not apply route in the morning, at noon, and at bedtime.   ipratropium (ATROVENT) 0.06 % nasal spray Place 2 sprays into both nostrils daily as needed for congestion.   levothyroxine (SYNTHROID, LEVOTHROID) 75 MCG tablet  TAKE 1 TABLET BY MOUTH EVERY DAY (Patient taking differently: Take 75 mcg by mouth daily before breakfast. )   loratadine (CLARITIN) 10 MG tablet Take 10 mg by mouth daily.   metFORMIN (GLUCOPHAGE) 1000 MG tablet Take 1,000 mg by mouth 2 (two) times daily with a meal.   metoprolol tartrate (LOPRESSOR) 50 MG tablet Take 1 tablet (50 mg total) by mouth 2 (two) times daily. Please make overdue appt with Dr. Elease Hashimoto before anymore refills. 1st attempt   ondansetron (ZOFRAN) 4 MG tablet Take 1 tablet (4 mg total) by mouth every 6 (six) hours as needed for nausea.   potassium chloride SA (K-DUR,KLOR-CON) 20 MEQ tablet Take 1 tablet (20 mEq total) by mouth daily.   propranolol (INDERAL) 10 MG tablet Take 10 mg by mouth 4 (four) times daily as needed (heart papatations).    cefTRIAXone (ROCEPHIN) 1 g injection SMARTSIG:1 Gram(s) IM Every Night (Patient not taking: Reported on 06/14/2020)   ciprofloxacin (CIPRO) 500 MG tablet Take 1 tablet (500 mg total) by mouth 2 (two) times daily. (Patient not taking: Reported on 06/14/2020)   phenazopyridine (PYRIDIUM) 95 MG tablet Take 1 tablet (95 mg total) by mouth 3 (three) times daily as needed for pain (up to 2 days for symptom relief). (Patient not taking: Reported on 06/14/2020)   sucralfate (CARAFATE) 1 g tablet Take 1 g by mouth in the morning, at noon, and at  bedtime. (Patient not taking: Reported on 06/14/2020)   No facility-administered encounter medications on file as of 06/14/2020.     Patient Active Problem List   Diagnosis Date Noted   E coli bacteremia 03/22/2020   Bacteremia due to Gram-negative bacteria 03/20/2020   Hypothyroidism    Pyelonephritis 02/28/2020   Intractable nausea and vomiting 02/28/2020   Nasal sinus congestion 03/15/2018   Subclinical hypothyroidism 08/05/2017   Hyperlipidemia 07/28/2016   Type 2 diabetes mellitus with hyperglycemia, without long-term current use of insulin ([facility]) 04/15/2016   Sinus tachycardia  11/12/2015   S/P radiofrequency ablation operation for arrhythmia 02/24/2015   SVT (supraventricular tachycardia) ([facility]) 08/13/2014   Palpitations 08/13/2014   Obesity, Class I, BMI 30-34.9 09/30/2013   HTN (hypertension) 08/01/2012     Health Maintenance Due  Topic Date Due   Hepatitis C Screening  Never done   PNEUMOCOCCAL POLYSACCHARIDE VACCINE AGE 84-64 HIGH RISK  Never done   COVID-19 Vaccine (1) Never done   TETANUS/TDAP  Never done   OPHTHALMOLOGY EXAM  07/28/2016   FOOT EXAM  06/09/2018   URINE MICROALBUMIN  06/09/2018   MAMMOGRAM  Never done   COLONOSCOPY  Never done     Review of Systems Review of Systems  Constitutional: Negative for fever, chills, diaphoresis, activity change, appetite change, fatigue and unexpected weight change.  HENT: Negative for congestion, sore throat, rhinorrhea, sneezing, trouble swallowing and sinus pressure.  Eyes: Negative for photophobia and visual disturbance.  Respiratory: Negative for cough, chest tightness, shortness of breath, wheezing and stridor.  Cardiovascular: Negative for chest pain, palpitations and leg swelling.  Gastrointestinal: Negative for nausea, vomiting, abdominal pain, diarrhea, constipation, blood in stool, abdominal distention and anal bleeding.  Genitourinary: Negative for dysuria, hematuria, flank pain and difficulty urinating.  Musculoskeletal: Negative for myalgias, back pain, joint swelling, arthralgias and gait problem.  Skin: Negative for color change, pallor, rash and wound.  Neurological: Negative for dizziness, tremors, weakness and light-headedness.  Hematological: Negative for adenopathy. Does not bruise/bleed easily.  Psychiatric/Behavioral: Negative for behavioral problems, confusion, sleep disturbance, dysphoric mood, decreased concentration and agitation.    Physical Exam   BP 137/89    Pulse 81    Wt 181 lb (82.1 kg)    LMP 07/22/2019    SpO2 100%    BMI 31.07 kg/m   Physical Exam    Constitutional:  oriented to person, place, and time. appears well-developed and well-nourished. No distress.  HENT: [place]/AT, PERRLA, no scleral icterus Mouth/Throat: Oropharynx is clear and moist. No oropharyngeal exudate.  Cardiovascular: Normal rate, regular rhythm and normal heart sounds. Exam reveals no gallop and no friction rub.  No murmur heard.  Pulmonary/Chest: Effort normal and breath sounds normal. No respiratory distress.  has no wheezes.  Neck = supple, no nuchal rigidity Abdominal: Soft. Bowel sounds are normal.  exhibits no distension. There is no tenderness.  Lymphadenopathy: no cervical adenopathy. No axillary adenopathy Neurological: alert and oriented to person, place, and time.  Skin: Skin is warm and dry. No rash noted. No erythema.  Psychiatric: a normal mood and affect.  behavior is normal.   CBC Lab Results  Component Value Date   WBC 4.5 03/22/2020   RBC 4.03 03/22/2020   HGB 10.0 (L) 03/22/2020   HCT 32.7 (L) 03/22/2020   PLT 292 03/22/2020   MCV 81.1 03/22/2020   MCH 24.8 (L) 03/22/2020   MCHC 30.6 03/22/2020   RDW 14.1 03/22/2020   LYMPHSABS 1.5 03/22/2020   MONOABS 0.5 03/22/2020  EOSABS 0.2 03/22/2020    BMET Lab Results  Component Value Date   NA 136 03/22/2020   K 4.1 03/22/2020   CL 101 03/22/2020   CO2 25 03/22/2020   GLUCOSE 130 (H) 03/22/2020   BUN 8 03/22/2020   CREATININE 0.45 03/22/2020   CALCIUM 8.9 03/22/2020   GFRNONAA >60 03/22/2020   GFRAA >60 03/22/2020      Assessment and Plan  Has classical symptoms for uti. Finish course of abtx today. If she were to have repeat symptoms, would recommend cx and ua to see what is isolated. Recommend to Follow up with urology. Unclear if she may need pelvic floor muscle exercises to help minimize frequency of symptoms.  Will give sample cup  Will put future order for ua and urine cx  ?worth trying cranberry extract

## 2020-09-23 ENCOUNTER — Other Ambulatory Visit: Payer: Self-pay | Admitting: Obstetrics and Gynecology

## 2020-09-23 DIAGNOSIS — Z1231 Encounter for screening mammogram for malignant neoplasm of breast: Secondary | ICD-10-CM

## 2020-10-24 NOTE — Progress Notes (Signed)
Virtual Visit via Telephone Note   This visit type was conducted due to national recommendations for restrictions regarding the COVID-19 Pandemic (e.g. social distancing) in an effort to limit this patient's exposure and mitigate transmission in our community.  Due to her co-morbid illnesses, this patient is at least at moderate risk for complications without adequate follow up.  This format is felt to be most appropriate for this patient at this time.  The patient did not have access to video technology/had technical difficulties with video requiring transitioning to audio format only (telephone).  All issues noted in this document were discussed and addressed.  No physical exam could be performed with this format.  Please refer to the patient's chart for her  consent to telehealth for [facility]    Date:  10/25/2020   ID:  Julia [name], DOB September 09, 1971, MRN [id number] The patient was identified using 2 identifiers.  Patient Location: Home Provider Location: Home Office  PCP:  Sherren Mocha, MD  Cardiologist:  [name], MD  Electrophysiologist:  None   Evaluation Performed:  Follow-Up Visit  Chief Complaint:  18 months follow up   History of Present Illness:    Julia [name] is a 49 y.o. female with paroxysmal supraventricular tachycardia status post radiofrequency catheter ablation with Dr. Ladona Ridgel in 2016, diabetes, hypertension, recurrent UTIs and hyperlipidemia seen for follow up.   Last seen by Tereso Newcomer 04/2019. Had COVID 23 Jan 2020. He got monoclonal antibody infusion.   Seen virtually for follow up.  Patient does elliptical and walking on treadmill on a regular basis.  Noted exertional "leg fatigue".  Baseline heart rate in 70s.  Reports intermittent tachycardia lasting 2 to 3 minutes.  This occurs once every few months.  This is stable.  Denies chest pain, shortness of breath, dizziness, orthopnea, PND, syncope, lower extremity edema or melena.   The patient does not  have symptoms concerning for COVID-19 infection (fever, chills, cough, or new shortness of breath).    Past Medical History:  Diagnosis Date  . Complication of anesthesia    I had a reaction to a epidural "broke out"  . Condyloma acuminatum of vulva   . COVID-19   . GERD (gastroesophageal reflux disease)   . History of supraventricular tachycardia   . HSV-1 infection   . HSV-2 infection   . HTN (hypertension)   . Hyperlipidemia   . Type 2 diabetes mellitus (HCC)    Past Surgical History:  Procedure Laterality Date  . CARDIOVASCULAR STRESS TEST  06-04-2007   normal nuclear study/  no ischemia/  normal LVF, ef 63%  . CESAREAN SECTION  09-06-2005  . LASER ABLATION CONDOLAMATA N/A 10/08/2015   Procedure: CO2 LASER ABLATION CONDOLAMATA OF VULVA;  Surgeon: Richarda Overlie, MD;  Location: [facility];  Service: Gynecology;  Laterality: N/A;  . SUPRAVENTRICULAR TACHYCARDIA ABLATION N/A 02/24/2015   Procedure: SUPRAVENTRICULAR TACHYCARDIA ABLATION;  Surgeon: Marinus Maw, MD;  Location: [facility] CATH LAB;  Service: Cardiovascular;  Laterality: N/A;  . TRANSTHORACIC ECHOCARDIOGRAM  01-06-2009   normal LVF, ef 55-60%,  trace MR and TR/  normal stress echo     Current Meds  Medication Sig  . albuterol (VENTOLIN HFA) 108 (90 Base) MCG/ACT inhaler Inhale 2 puffs into the lungs every 4 (four) hours as needed for wheezing or shortness of breath.  . esomeprazole (NEXIUM) 40 MG capsule Take 1 capsule (40 mg total) by mouth daily.  . fluticasone (FLONASE) 50 MCG/ACT nasal  spray Place into both nostrils daily.  . furosemide (LASIX) 40 MG tablet Take 20-40 mg by mouth daily as needed.  Marland Kitchen. glucose blood (ONE TOUCH ULTRA TEST) test strip USE AS DIRECTED  . ipratropium (ATROVENT) 0.06 % nasal spray Place 2 sprays into both nostrils daily as needed for congestion.  Marland Kitchen. levothyroxine (SYNTHROID, LEVOTHROID) 75 MCG tablet TAKE 1 TABLET BY MOUTH EVERY DAY (Patient taking differently: Take 88 mcg by  mouth daily before breakfast. )  . loratadine (CLARITIN) 10 MG tablet Take 10 mg by mouth daily.  . metFORMIN (GLUCOPHAGE) 1000 MG tablet Take 1,000 mg by mouth 2 (two) times daily with a meal.  . potassium chloride SA (K-DUR,KLOR-CON) 20 MEQ tablet Take 1 tablet (20 mEq total) by mouth daily.  . propranolol (INDERAL) 10 MG tablet Take 10 mg by mouth 4 (four) times daily as needed (heart papatations).   . Semaglutide,0.25 or 0.5MG /DOS, (OZEMPIC, 0.25 OR 0.5 MG/DOSE,) 2 MG/1.5ML SOPN Inject 0.25 mg into the skin once a week.  . [DISCONTINUED] cefTRIAXone (ROCEPHIN) 1 g injection SMARTSIG:1 Gram(s) IM Every Night  . [DISCONTINUED] cephALEXin (KEFLEX) 500 MG capsule Take 500 mg by mouth 4 (four) times daily.  . [DISCONTINUED] ciprofloxacin (CIPRO) 500 MG tablet Take 1 tablet (500 mg total) by mouth 2 (two) times daily.  . [DISCONTINUED] empagliflozin (JARDIANCE) 10 MG TABS tablet Take by mouth daily.  . [DISCONTINUED] insulin aspart (NOVOLOG) 100 UNIT/ML FlexPen Inject 5 Units into the skin 3 (three) times daily with meals. (Patient taking differently: Inject 5-10 Units into the skin 3 (three) times daily with meals. )  . [DISCONTINUED] Insulin Glargine (BASAGLAR KWIKPEN) 100 UNIT/ML Inject 15 Units into the skin at bedtime.   . [DISCONTINUED] Insulin Pen Needle 31G X 5 MM MISC 5 Units by Does not apply route in the morning, at noon, and at bedtime.  . [DISCONTINUED] metoprolol tartrate (LOPRESSOR) 50 MG tablet Take 1 tablet (50 mg total) by mouth 2 (two) times daily. Please make overdue appt with Dr. Elease HashimotoNahser before anymore refills. 1st attempt  . [DISCONTINUED] metoprolol tartrate (LOPRESSOR) 50 MG tablet Take 1 tablet (50 mg total) by mouth 2 (two) times daily.  . [DISCONTINUED] ondansetron (ZOFRAN) 4 MG tablet Take 1 tablet (4 mg total) by mouth every 6 (six) hours as needed for nausea.  . [DISCONTINUED] phenazopyridine (PYRIDIUM) 95 MG tablet Take 1 tablet (95 mg total) by mouth 3 (three) times daily  as needed for pain (up to 2 days for symptom relief).  . [DISCONTINUED] sucralfate (CARAFATE) 1 g tablet Take 1 g by mouth in the morning, at noon, and at bedtime.      Allergies:   Sulfa antibiotics, Sulfur, Trulicity [dulaglutide], Hctz [hydrochlorothiazide], and Invokana [canagliflozin]   Social History   Tobacco Use  . Smoking status: Former Smoker    Years: 8.00    Types: Cigarettes    Quit date: 12/16/2002    Years since quitting: 17.8  . Smokeless tobacco: Never Used  Vaping Use  . Vaping Use: Never used  Substance Use Topics  . Alcohol use: Yes    Alcohol/week: 2.0 standard drinks    Types: 2 Standard drinks or equivalent per week    Comment: rare  . Drug use: No     Family Hx: The patient's family history includes Diabetes in her maternal grandmother and mother; Hyperlipidemia in an other family member; Hypertension in her mother; Thyroid disease in her mother, sister, and another family member.  ROS:   Please see  the history of present illness.    All other systems reviewed and are negative.   Prior CV studies:   The following studies were reviewed today:  Echo 02/2017 Study Conclusions   - Left ventricle: The cavity size was normal. Systolic function was  normal. Wall motion was normal; there were no regional wall  motion abnormalities. Left ventricular diastolic function  parameters were normal.  - Aortic valve: Trileaflet; normal thickness leaflets. There was no  regurgitation.  - Aortic root: The aortic root was normal in size.  - Mitral valve: Structurally normal valve. There was no  regurgitation.  - Left atrium: The atrium was normal in size.  - Right ventricle: Systolic function was normal.  - Right atrium: The atrium was normal in size.  - Tricuspid valve: There was trivial regurgitation.  - Pulmonary arteries: Systolic pressure was within the normal  range.  - Inferior vena cava: The vessel was normal in size.  - Pericardium,  extracardiac: There was no pericardial effusion.   Monitor 12/2016  NSR with rare PVCs  No significant arrhythmias     Labs/Other Tests and Data Reviewed:    EKG:  No ECG reviewed.  Recent Labs: 02/28/2020: TSH 2.672 03/18/2020: ALT 14 03/21/2020: Magnesium 2.0 03/22/2020: BUN 8; Creatinine, Ser 0.45; Hemoglobin 10.0; Platelets 292; Potassium 4.1; Sodium 136   Recent Lipid Panel Lab Results  Component Value Date/Time   CHOL 197 06/09/2017 08:34 AM   TRIG 640 (HH) 06/09/2017 08:34 AM   HDL 29 (L) 06/09/2017 08:34 AM   CHOLHDL 6.8 (H) 06/09/2017 08:34 AM   CHOLHDL 8.0 (H) 04/15/2016 09:12 AM   LDLCALC Comment 06/09/2017 08:34 AM    Wt Readings from Last 3 Encounters:  10/25/20 181 lb (82.1 kg)  06/14/20 181 lb (82.1 kg)  04/07/20 173 lb (78.5 kg)     Objective:    Vital Signs:  Ht 5\' 4"  (1.626 m)   Wt 181 lb (82.1 kg)   LMP 07/22/2019   BMI 31.07 kg/m    VITAL SIGNS:  reviewed GEN:  no acute distress PSYCH:  normal affect  ASSESSMENT & PLAN:    1. SVT s/p ablation in 2016 -Her palpitations are stable however noticed exertional leg fatigue.  She had a similar symptoms while on metoprolol 200 mg.  No exertional chest tightness or shortness of breath concerns for underlying CAD. -Will reduce metoprolol tartrate to 25 mg twice daily from 50mg  BID.  She will keep log of her symptoms.  Advised to take extra 25 mg for recurrent tachycardia.  I have reviewed use of medication in detail and patient is agree with plan.  2. HTN -Reports stable blood pressure.  Medication changes as above.  3. DM - Per PCP  Shared Decision Making/Informed Consent      About medication dose change  COVID-19 Education: The signs and symptoms of COVID-19 were discussed with the patient and how to seek care for testing (follow up with PCP or arrange E-visit).  The importance of social distancing was discussed today.  Time:   Today, I have spent 11  minutes with the patient with  telehealth technology discussing the above problems.     Medication Adjustments/Labs and Tests Ordered: Current medicines are reviewed at length with the patient today.  Concerns regarding medicines are outlined above.   Tests Ordered: No orders of the defined types were placed in this encounter.   Medication Changes: Meds ordered this encounter  Medications  . DISCONTD: metoprolol tartrate (LOPRESSOR)  50 MG tablet    Sig: Take 1 tablet (50 mg total) by mouth 2 (two) times daily.    Dispense:  180 tablet    Refill:  3  . metoprolol tartrate (LOPRESSOR) 25 MG tablet    Sig: Take 1 tablet (25 mg total) by mouth 2 (two) times daily.    Dispense:  180 tablet    Refill:  3    Follow Up:  Virtual Visit  in 3 month(s)  Signed, Manson Passey, PA  10/25/2020 3:52 PM    [facility]

## 2020-10-25 ENCOUNTER — Encounter: Payer: Self-pay | Admitting: *Deleted

## 2020-10-25 ENCOUNTER — Other Ambulatory Visit: Payer: Self-pay

## 2020-10-25 ENCOUNTER — Telehealth: Payer: Self-pay | Admitting: *Deleted

## 2020-10-25 ENCOUNTER — Telehealth (INDEPENDENT_AMBULATORY_CARE_PROVIDER_SITE_OTHER): Payer: 59 | Admitting: Physician Assistant

## 2020-10-25 VITALS — Ht 64.0 in | Wt 181.0 lb

## 2020-10-25 DIAGNOSIS — I471 Supraventricular tachycardia: Secondary | ICD-10-CM | POA: Diagnosis not present

## 2020-10-25 DIAGNOSIS — I10 Essential (primary) hypertension: Secondary | ICD-10-CM | POA: Diagnosis not present

## 2020-10-25 DIAGNOSIS — E1165 Type 2 diabetes mellitus with hyperglycemia: Secondary | ICD-10-CM

## 2020-10-25 DIAGNOSIS — Z7189 Other specified counseling: Secondary | ICD-10-CM | POA: Diagnosis not present

## 2020-10-25 MED ORDER — METOPROLOL TARTRATE 50 MG PO TABS: 50.0000 mg | ORAL_TABLET | Freq: Two times a day (BID) | ORAL | 3 refills | Status: DC

## 2020-10-25 MED ORDER — METOPROLOL TARTRATE 25 MG PO TABS: 25.0000 mg | ORAL_TABLET | Freq: Two times a day (BID) | ORAL | 3 refills | Status: DC

## 2020-10-25 NOTE — Patient Instructions (Addendum)
Medication Instructions:  Your physician has recommended you make the following change in your medication:  1.  REDUCE the Metoprolol to 25 mg taking 1 tablet twice a day.  DO NOT PICK UP THE REFILL FOR 50 MG THAT WAS SENT IN.  *If you need a refill on your cardiac medications before your next appointment, please call your pharmacy*   Lab Work: None ordered  If you have labs (blood work) drawn today and your tests are completely normal, you will receive your results only by: Marland Kitchen MyChart Message (if you have MyChart) OR . A paper copy in the mail If you have any lab test that is abnormal or we need to change your treatment, we will call you to review the results.   Testing/Procedures: None ordered    Follow-Up: At [facility], you and your health needs are our priority.  As part of our continuing mission to provide you with exceptional heart care, we have created designated Provider Care Teams.  These Care Teams include your primary Cardiologist (physician) and Advanced Practice Providers (APPs -  Physician Assistants and Nurse Practitioners) who all work together to provide you with the care you need, when you need it.  We recommend signing up for the patient portal called "MyChart".  Sign up information is provided on this After Visit Summary.  MyChart is used to connect with patients for Virtual Visits (Telemedicine).  Patients are able to view lab/test results, encounter notes, upcoming appointments, etc.  Non-urgent messages can be sent to your provider as well.   To learn more about what you can do with MyChart, go to [email or web address].    Your next appointment:   3 month(s)  YOU CAN CALL AND SCHEDULE THIS   The format for your next appointment:   In Person  Provider:   You may see [name], MD or one of the following Advanced Practice Providers on your designated Care Team:    Tereso Newcomer, PA-C  Chelsea Aus, [place]    Other Instructions

## 2020-10-25 NOTE — Telephone Encounter (Signed)
  Patient Consent for Virtual Visit         Julia [name] has provided verbal consent on 10/25/2020 for a virtual visit (video or telephone).   CONSENT FOR VIRTUAL VISIT FOR:  Julia [name]  By participating in this virtual visit I agree to the following:  I hereby voluntarily request, consent and authorize [facility] and its employed or contracted physicians, Producer, television/film/video, nurse practitioners or other licensed health care professionals (the Practitioner), to provide me with telemedicine health care services (the "Services") as deemed necessary by the treating Practitioner. I acknowledge and consent to receive the Services by the Practitioner via telemedicine. I understand that the telemedicine visit will involve communicating with the Practitioner through live audiovisual communication technology and the disclosure of certain medical information by electronic transmission. I acknowledge that I have been given the opportunity to request an in-person assessment or other available alternative prior to the telemedicine visit and am voluntarily participating in the telemedicine visit.  I understand that I have the right to withhold or withdraw my consent to the use of telemedicine in the course of my care at any time, without affecting my right to future care or treatment, and that the Practitioner or I may terminate the telemedicine visit at any time. I understand that I have the right to inspect all information obtained and/or recorded in the course of the telemedicine visit and may receive copies of available information for a reasonable fee.  I understand that some of the potential risks of receiving the Services via telemedicine include:  Marland Kitchen Delay or interruption in medical evaluation due to technological equipment failure or disruption; . Information transmitted may not be sufficient (e.g. poor resolution of images) to allow for appropriate medical decision making by the Practitioner; and/or   . In rare instances, security protocols could fail, causing a breach of personal health information.  Furthermore, I acknowledge that it is my responsibility to provide information about my medical history, conditions and care that is complete and accurate to the best of my ability. I acknowledge that Practitioner's advice, recommendations, and/or decision may be based on factors not within their control, such as incomplete or inaccurate data provided by me or distortions of diagnostic images or specimens that may result from electronic transmissions. I understand that the practice of medicine is not an exact science and that Practitioner makes no warranties or guarantees regarding treatment outcomes. I acknowledge that a copy of this consent can be made available to me via my patient portal [facility] MyChart), or I can request a printed copy by calling the office of [facility].    I understand that my insurance will be billed for this visit.   I have read or had this consent read to me. . I understand the contents of this consent, which adequately explains the benefits and risks of the Services being provided via telemedicine.  . I have been provided ample opportunity to ask questions regarding this consent and the Services and have had my questions answered to my satisfaction. . I give my informed consent for the services to be provided through the use of telemedicine in my medical care

## 2020-11-17 ENCOUNTER — Other Ambulatory Visit: Payer: Self-pay

## 2020-11-17 ENCOUNTER — Ambulatory Visit
Admission: RE | Admit: 2020-11-17 | Discharge: 2020-11-17 | Disposition: A | Discharge status: Home / Self Care | Payer: 59 | Source: Ambulatory Visit | Attending: Obstetrics and Gynecology | Admitting: Obstetrics and Gynecology

## 2020-11-17 DIAGNOSIS — Z1231 Encounter for screening mammogram for malignant neoplasm of breast: Secondary | ICD-10-CM

## 2021-01-10 IMAGING — DX DG CHEST 1V PORT
1 series · 1 of 1 positions shown · non-contrast
Comparison: 01/22/2020

CLINICAL DATA: Sepsis

EXAM:
PORTABLE CHEST 1 VIEW

[chest ap]
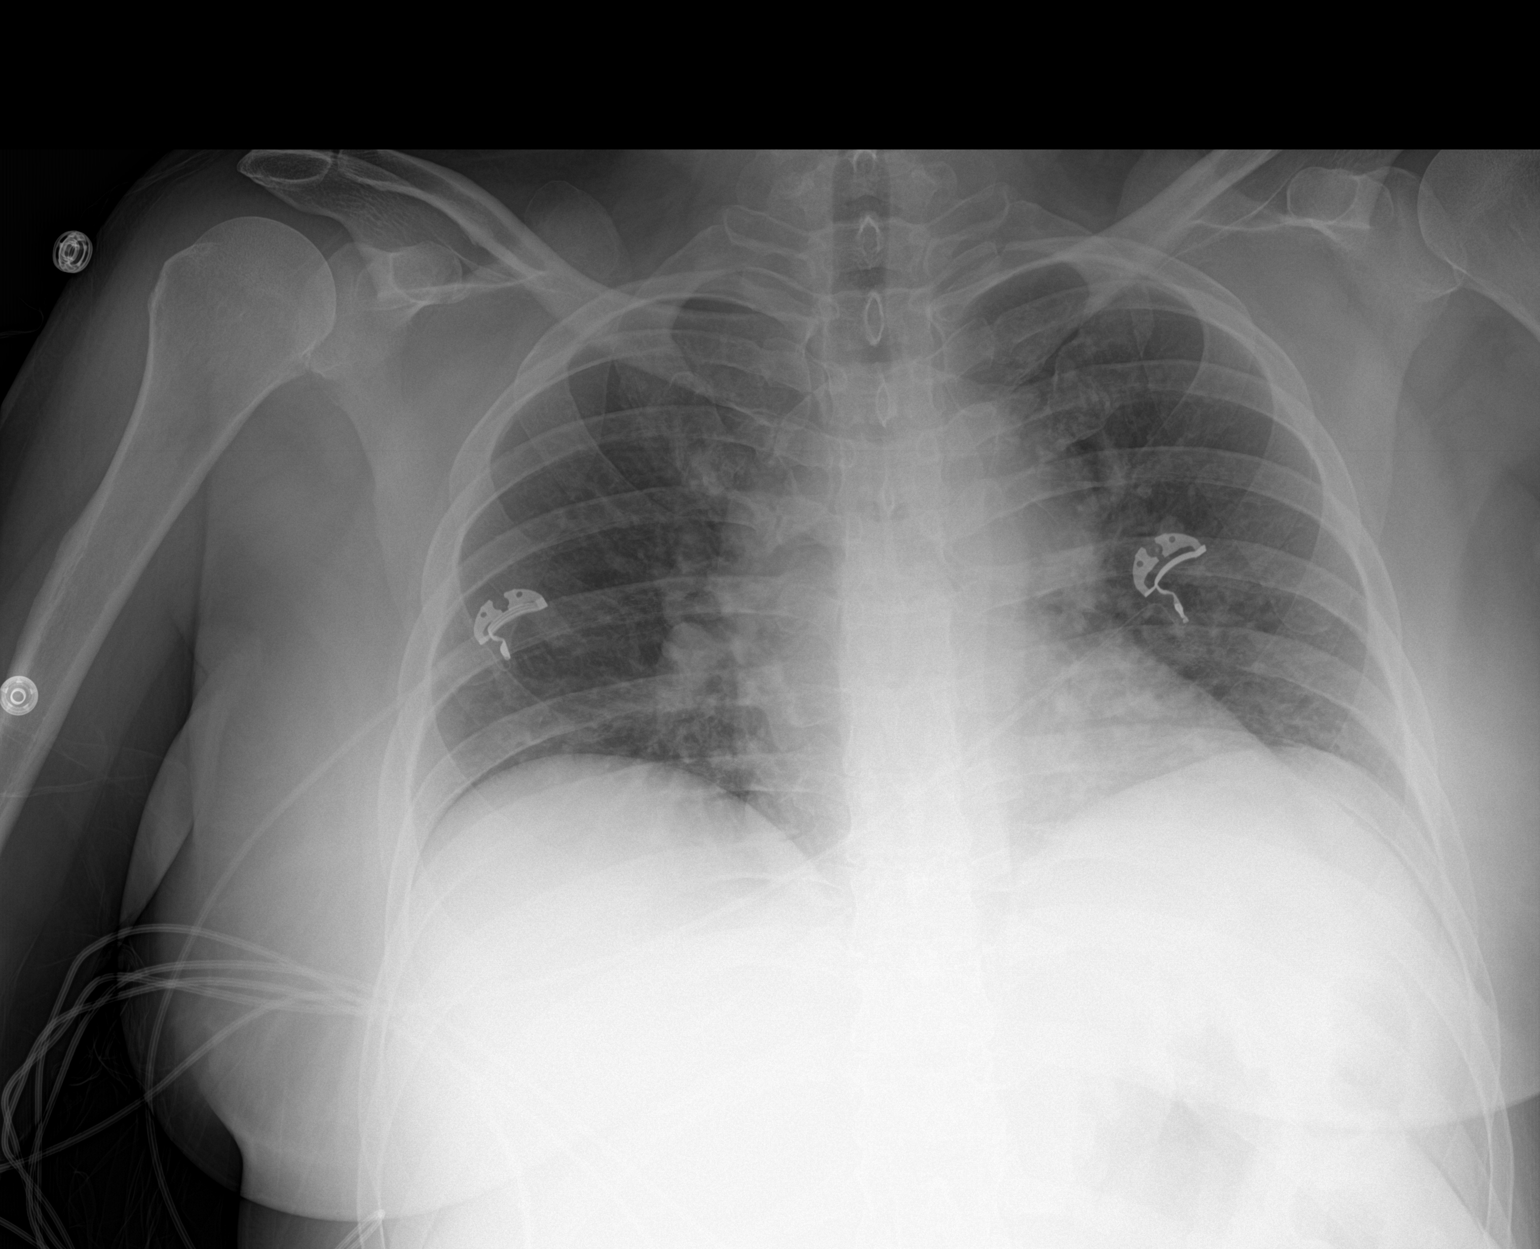

[1 of 1 positions shown; findings below may reference images not displayed]

FINDINGS: Single frontal view of the chest demonstrates a stable cardiac
silhouette. No airspace disease, effusion, or pneumothorax. Lung
volumes are diminished.
IMPRESSION: 1. Low lung volumes, no acute process.

## 2021-02-02 ENCOUNTER — Other Ambulatory Visit: Payer: Self-pay

## 2021-02-02 ENCOUNTER — Ambulatory Visit: Payer: 59 | Attending: Sports Medicine | Admitting: Physical Therapy

## 2021-02-02 DIAGNOSIS — M25671 Stiffness of right ankle, not elsewhere classified: Secondary | ICD-10-CM | POA: Diagnosis present

## 2021-02-02 DIAGNOSIS — M25571 Pain in right ankle and joints of right foot: Secondary | ICD-10-CM

## 2021-02-02 NOTE — Therapy (Signed)
[name] [address] [place], [place], [address] Phone: [phone]   Fax:  [phone]  Physical Therapy Evaluation  Patient Details  Name: Julia [name] MRN: [id number] Date of Birth: 12-May-1971 Referring Provider (PT): Rodolph Bong MD   Encounter Date: 02/02/2021   PT End of Session - 02/02/21 1150    Visit Number 1    Number of Visits 12    Date for PT Re-Evaluation 03/02/21    Authorization Type FOTO.    PT Start Time 0815    PT Stop Time 0906    PT Time Calculation (min) 51 min    Activity Tolerance Patient tolerated treatment well;Treatment limited secondary to medical complications (Comment)           Past Medical History:  Diagnosis Date  . Complication of anesthesia    I had a reaction to a epidural "broke out"  . Condyloma acuminatum of vulva   . COVID-19   . GERD (gastroesophageal reflux disease)   . History of supraventricular tachycardia   . HSV-1 infection   . HSV-2 infection   . HTN (hypertension)   . Hyperlipidemia   . Type 2 diabetes mellitus (HCC)     Past Surgical History:  Procedure Laterality Date  . CARDIOVASCULAR STRESS TEST  06-04-2007   normal nuclear study/  no ischemia/  normal LVF, ef 63%  . CESAREAN SECTION  09-06-2005  . LASER ABLATION CONDOLAMATA N/A 10/08/2015   Procedure: CO2 LASER ABLATION CONDOLAMATA OF VULVA;  Surgeon: Richarda Overlie, MD;  Location: [facility];  Service: Gynecology;  Laterality: N/A;  . SUPRAVENTRICULAR TACHYCARDIA ABLATION N/A 02/24/2015   Procedure: SUPRAVENTRICULAR TACHYCARDIA ABLATION;  Surgeon: Marinus Maw, MD;  Location: [facility] CATH LAB;  Service: Cardiovascular;  Laterality: N/A;  . TRANSTHORACIC ECHOCARDIOGRAM  01-06-2009   normal LVF, ef 55-60%,  trace MR and TR/  normal stress echo    There were no vitals filed for this visit.    Subjective Assessment - 02/02/21 1155    Subjective COVID-19 screen performed prior to patient entering  clinic.  The patient presents to the clinic today with c/o right Achilles pain after taking a step on 01/18/21 and feeling pain.  She was told she has a partial tear of her Achilles.  She is wearing a CAM walker/boot and at rest has no pain.  If she puts pressure on toes she feels pain.    Pertinent History SVT, DM, h/o right heel pain.    Patient Stated Goals Get back to normal.    Currently in Pain? No/denies              [facility] PT Assessment - 02/02/21 0001      Assessment   Medical Diagnosis Right foot pain    Referring Provider (PT) Rodolph Bong MD    Onset Date/Surgical Date 01/18/21      Precautions   Precaution Comments Right CAM walker/boot.      Restrictions   Other Position/Activity Restrictions WBAT with right CAM walker/boot.      Balance Screen   Has the patient fallen in the past 6 months No    Has the patient had a decrease in activity level because of a fear of falling?  No    Is the patient reluctant to leave their home because of a fear of falling?  No      Home [employer]ist information centre manager residence      Prior Function  Level of Independence Independent      Observation/Other Assessments   Focus on Therapeutic Outcomes (FOTO)  Complete.      Observation/Other Assessments-Edema    Edema --   Min+ localized edema near right distal Achilles.     ROM / Strength   AROM / PROM / Strength AROM      AROM   Overall AROM Comments Right ankle active dorsiflexion with knee extensded and flexed to neutral.  Full active right ankle inversion/eversion.      Palpation   Palpation comment Tender to palpation at right musculo-tendinous junction more lateral than medial.  Also some discomfort at the heel (posterior calcaneus) which she has had pain in this region prior to injury.      Special Tests   Other special tests (-) Janee Morn test.      Ambulation/Gait   Gait Comments WBAT over right LE with CAM walker/boot.                       Objective measurements completed on examination: See above findings.       [facility] Adult PT Treatment/Exercise - 02/02/21 0001      Modalities   Modalities Electrical Stimulation;Vasopneumatic      Electrical Stimulation   Electrical Stimulation Location Affected right Achilles    Electrical Stimulation Action Low-level Pre-mod.    Electrical Stimulation Parameters 80-150 Hz x 20 minutes.    Electrical Stimulation Goals Edema;Pain      Vasopneumatic   Number Minutes Vasopneumatic  20 minutes    Vasopnuematic Location  --   RT foot/ankle.   Vasopneumatic Pressure Low                       PT Long Term Goals - 02/02/21 1214      PT LONG TERM GOAL #1   Title Independent with a HEP.    Time 4    Period Weeks    Status New      PT LONG TERM GOAL #2   Title Increase right ankle dorsiflexion to 6- 8 degrees to normalize the patient's gait pattern.    Time 4    Period Weeks    Status New      PT LONG TERM GOAL #3   Title Walk a community distance without CAM walker/boot.    Time 4    Period Weeks    Status New      PT LONG TERM GOAL #4   Title Perform a reciprocating stair gait with one railing.    Time 4    Period Weeks    Status New      PT LONG TERM GOAL #5   Title Perform ADL's with right ankle pain not > 2/10.    Time 4    Period Weeks    Status New                  Plan - 02/02/21 1208    Clinical Impression Statement The patient presents to OPPT with c/o right Achilles pain due to an injury on 01/18/21.  She is wbat over her right LE with a CAM walker/boot.  Her active dorsiflexion is limited currently as expected.  She is tender in the region of the right Achilles musculo-tendinous junction wiht min+ edema.  She demonstrated a negative Thompson test. She has a h/o right heel pain.   Patient will benefit from skilled physical therapy intervention to address pain  and deficits.    Personal Factors and Comorbidities  Comorbidity 1;Other    Comorbidities SVT, DM, h/o right heel pain.    Examination-Activity Limitations Other;Locomotion Level    Examination-Participation Restrictions Other    Stability/Clinical Decision Making Stable/Uncomplicated    Clinical Decision Making Low    Rehab Potential Excellent    PT Frequency 3x / week    PT Duration 4 weeks    PT Treatment/Interventions ADLs/Self [name] Home Management;Cryotherapy;Electrical Stimulation;Ultrasound;Moist Heat;Iontophoresis 4mg /ml Dexamethasone;Gait training;Stair training;Functional mobility training;Therapeutic activities;Therapeutic exercise;Neuromuscular re-education;Manual techniques;Patient/family education;Passive range of motion;Vasopneumatic Device    PT Next Visit Plan Gentle STW/M to patient's right calf/Achilles, seated Rockerboard/BAPS board, low-level combo e'stim/US, low-level e'stim/Vaso.  Progress to standing activites per updated MD order.    Consulted and Agree with Plan of [name] Patient           Patient will benefit from skilled therapeutic intervention in order to improve the following deficits and impairments:  Abnormal gait,Pain,Decreased activity tolerance,Decreased range of motion,Increased edema  Visit Diagnosis: Pain in right ankle and joints of right foot - Plan: PT plan of [name] cert/re-cert  Stiffness of right ankle, not elsewhere classified - Plan: PT plan of [name] cert/re-cert     Problem List Patient Active Problem List   Diagnosis Date Noted  . E coli bacteremia 03/22/2020  . Bacteremia due to Gram-negative bacteria 03/20/2020  . Hypothyroidism   . Pyelonephritis 02/28/2020  . Intractable nausea and vomiting 02/28/2020  . Nasal sinus congestion 03/15/2018  . Subclinical hypothyroidism 08/05/2017  . Hyperlipidemia 07/28/2016  . Type 2 diabetes mellitus with hyperglycemia, without long-term current use of insulin (HCC) 04/15/2016  . Sinus tachycardia 11/12/2015  . S/P radiofrequency ablation operation  for arrhythmia 02/24/2015  . SVT (supraventricular tachycardia) (HCC) 08/13/2014  . Palpitations 08/13/2014  . Obesity, Class I, BMI 30-34.9 09/30/2013  . HTN (hypertension) 08/01/2012    Malissa Slay, 08/03/2012  MPT 02/02/2021, 12:17 PM  [facility] [address] [place], [place], [place] Phone: [phone]   Fax:  [phone]  Name: Julia [name] MRN: [name] Date of Birth: August 31, 1971

## 2021-02-04 ENCOUNTER — Ambulatory Visit: Payer: 59 | Admitting: Physical Therapy

## 2021-02-04 ENCOUNTER — Other Ambulatory Visit: Payer: Self-pay

## 2021-02-04 DIAGNOSIS — M25571 Pain in right ankle and joints of right foot: Secondary | ICD-10-CM | POA: Diagnosis not present

## 2021-02-04 DIAGNOSIS — M25671 Stiffness of right ankle, not elsewhere classified: Secondary | ICD-10-CM

## 2021-02-04 NOTE — Therapy (Signed)
[facility] [address] [place], [place], [address] Phone: [phone]   Fax:  [phone]  Physical Therapy Treatment  Patient Details  Name: Julia [name] MRN: [id number] Date of Birth: 04/16/1971 Referring Provider (PT): Julia Bong MD   Encounter Date: 02/04/2021   PT End of Session - 02/04/21 1235    Visit Number 2    Number of Visits 12    Date for PT Re-Evaluation 03/02/21    Authorization Type FOTO.    PT Start Time 1117    PT Stop Time 1210    PT Time Calculation (min) 53 min    Activity Tolerance Patient tolerated treatment well    Behavior During Therapy WFL for tasks assessed/performed           Past Medical History:  Diagnosis Date  . Complication of anesthesia    I had a reaction to a epidural "broke out"  . Condyloma acuminatum of vulva   . COVID-19   . GERD (gastroesophageal reflux disease)   . History of supraventricular tachycardia   . HSV-1 infection   . HSV-2 infection   . HTN (hypertension)   . Hyperlipidemia   . Type 2 diabetes mellitus (HCC)     Past Surgical History:  Procedure Laterality Date  . CARDIOVASCULAR STRESS TEST  06-04-2007   normal nuclear study/  no ischemia/  normal LVF, ef 63%  . CESAREAN SECTION  09-06-2005  . LASER ABLATION CONDOLAMATA N/A 10/08/2015   Procedure: CO2 LASER ABLATION CONDOLAMATA OF VULVA;  Surgeon: Richarda Overlie, MD;  Location: [facility];  Service: Gynecology;  Laterality: N/A;  . SUPRAVENTRICULAR TACHYCARDIA ABLATION N/A 02/24/2015   Procedure: SUPRAVENTRICULAR TACHYCARDIA ABLATION;  Surgeon: Marinus Maw, MD;  Location: [facility] CATH LAB;  Service: Cardiovascular;  Laterality: N/A;  . TRANSTHORACIC ECHOCARDIOGRAM  01-06-2009   normal LVF, ef 55-60%,  trace MR and TR/  normal stress echo    There were no vitals filed for this visit.   Subjective Assessment - 02/04/21 1222    Subjective COVID-19 screen performed prior to patient entering clinic.  Heel was hurting in boot.    Pertinent History SVT, DM, h/o right heel pain.    Patient Stated Goals Get back to normal.                             [facility] Adult PT Treatment/Exercise - 02/04/21 0001      Modalities   Modalities Electrical Stimulation;Ultrasound;Vasopneumatic      Electrical Stimulation   Electrical Stimulation Location Affected right Achilles.    Electrical Stimulation Action Low-level Pre-mod.    Electrical Stimulation Parameters 80-150 Hz x 20 minutes.    Electrical Stimulation Goals Edema;Pain      Ultrasound   Ultrasound Location Right affected Achilles.    Ultrasound Parameters Small soundhead;  combo e'stim/US at 1.50 W/CM2 x 12 minutes.    Ultrasound Goals Pain      Vasopneumatic   Number Minutes Vasopneumatic  20 minutes    Vasopnuematic Location  --   RT ankle/foot.   Vasopneumatic Pressure Low      Manual Therapy   Manual Therapy Soft tissue mobilization    Manual therapy comments Gentle STW/M x 13 minutes to patient's affected right Achilles region.                       PT Long Term Goals - 02/02/21 1214  PT LONG TERM GOAL #1   Title Independent with a HEP.    Time 4    Period Weeks    Status New      PT LONG TERM GOAL #2   Title Increase right ankle dorsiflexion to 6- 8 degrees to normalize the patient's gait pattern.    Time 4    Period Weeks    Status New      PT LONG TERM GOAL #3   Title Walk a community distance without CAM walker/boot.    Time 4    Period Weeks    Status New      PT LONG TERM GOAL #4   Title Perform a reciprocating stair gait with one railing.    Time 4    Period Weeks    Status New      PT LONG TERM GOAL #5   Title Perform ADL's with right ankle pain not > 2/10.    Time 4    Period Weeks    Status New                 Plan - 02/04/21 1232    Clinical Impression Statement Patient tolerated treatment well.  Normal modality response following removal of  modality.    Personal Factors and Comorbidities Comorbidity 1;Other    Comorbidities SVT, DM, h/o right heel pain.    Examination-Activity Limitations Other;Locomotion Level    Examination-Participation Restrictions Other    Stability/Clinical Decision Making Stable/Uncomplicated    Rehab Potential Excellent    PT Frequency 3x / week    PT Duration 4 weeks    PT Treatment/Interventions ADLs/Self Care Home Management;Cryotherapy;Electrical Stimulation;Ultrasound;Moist Heat;Iontophoresis 4mg /ml Dexamethasone;Gait training;Stair training;Functional mobility training;Therapeutic activities;Therapeutic exercise;Neuromuscular re-education;Manual techniques;Patient/family education;Passive range of motion;Vasopneumatic Device    PT Next Visit Plan Gentle STW/M to patient's right calf/Achilles, seated Rockerboard/BAPS board, low-level combo e'stim/US, low-level e'stim/Vaso.  Progress to standing activites per updated MD order.    Consulted and Agree with Plan of Care Patient           Patient will benefit from skilled therapeutic intervention in order to improve the following deficits and impairments:  Abnormal gait,Pain,Decreased activity tolerance,Decreased range of motion,Increased edema  Visit Diagnosis: Pain in right ankle and joints of right foot  Stiffness of right ankle, not elsewhere classified     Problem List Patient Active Problem List   Diagnosis Date Noted  . E coli bacteremia 03/22/2020  . Bacteremia due to Gram-negative bacteria 03/20/2020  . Hypothyroidism   . Pyelonephritis 02/28/2020  . Intractable nausea and vomiting 02/28/2020  . Nasal sinus congestion 03/15/2018  . Subclinical hypothyroidism 08/05/2017  . Hyperlipidemia 07/28/2016  . Type 2 diabetes mellitus with hyperglycemia, without long-term current use of insulin (HCC) 04/15/2016  . Sinus tachycardia 11/12/2015  . S/P radiofrequency ablation operation for arrhythmia 02/24/2015  . SVT (supraventricular  tachycardia) (HCC) 08/13/2014  . Palpitations 08/13/2014  . Obesity, Class I, BMI 30-34.9 09/30/2013  . HTN (hypertension) 08/01/2012    Julia [name], 08/03/2012 MPT 02/04/2021, 12:36 PM  [facility] [address] [place], [place], [place] Phone: [phone]   Fax:  [phone]  Name: Julia [name] MRN: [name] Date of Birth: 02-05-1971

## 2021-02-09 ENCOUNTER — Other Ambulatory Visit: Payer: Self-pay

## 2021-02-09 ENCOUNTER — Ambulatory Visit: Payer: 59 | Admitting: Physical Therapy

## 2021-02-09 DIAGNOSIS — M25571 Pain in right ankle and joints of right foot: Secondary | ICD-10-CM | POA: Diagnosis not present

## 2021-02-09 DIAGNOSIS — M25671 Stiffness of right ankle, not elsewhere classified: Secondary | ICD-10-CM

## 2021-02-09 NOTE — Therapy (Signed)
[facility] [address] [place], [place], [address] Phone: [phone]   Fax:  [phone]  Physical Therapy Treatment  Patient Details  Name: Julia [name] MRN: [id number] Date of Birth: 10-03-1971 Referring Provider (PT): Rodolph Bong MD   Encounter Date: 02/09/2021   PT End of Session - 02/09/21 1338    Visit Number 3    Number of Visits 12    Date for PT Re-Evaluation 03/02/21    Authorization Type FOTO.    PT Start Time 0103    PT Stop Time 0153    PT Time Calculation (min) 50 min    Activity Tolerance Patient tolerated treatment well    Behavior During Therapy WFL for tasks assessed/performed           Past Medical History:  Diagnosis Date  . Complication of anesthesia    I had a reaction to a epidural "broke out"  . Condyloma acuminatum of vulva   . COVID-19   . GERD (gastroesophageal reflux disease)   . History of supraventricular tachycardia   . HSV-1 infection   . HSV-2 infection   . HTN (hypertension)   . Hyperlipidemia   . Type 2 diabetes mellitus (HCC)     Past Surgical History:  Procedure Laterality Date  . CARDIOVASCULAR STRESS TEST  06-04-2007   normal nuclear study/  no ischemia/  normal LVF, ef 63%  . CESAREAN SECTION  09-06-2005  . LASER ABLATION CONDOLAMATA N/A 10/08/2015   Procedure: CO2 LASER ABLATION CONDOLAMATA OF VULVA;  Surgeon: Richarda Overlie, MD;  Location: [facility];  Service: Gynecology;  Laterality: N/A;  . SUPRAVENTRICULAR TACHYCARDIA ABLATION N/A 02/24/2015   Procedure: SUPRAVENTRICULAR TACHYCARDIA ABLATION;  Surgeon: Marinus Maw, MD;  Location: [facility] CATH LAB;  Service: Cardiovascular;  Laterality: N/A;  . TRANSTHORACIC ECHOCARDIOGRAM  01-06-2009   normal LVF, ef 55-60%,  trace MR and TR/  normal stress echo    There were no vitals filed for this visit.   Subjective Assessment - 02/09/21 1337    Subjective COVID-19 screen performed prior to patient entering clinic.   Feels better.    Pertinent History SVT, DM, h/o right heel pain.    Patient Stated Goals Get back to normal.    Currently in Pain? Yes    Pain Score 2     Pain Location Ankle    Pain Orientation Right                             [facility] Adult PT Treatment/Exercise - 02/09/21 0001      Electrical Stimulation   Electrical Stimulation Location Right affected Achilles    Electrical Stimulation Action Low-level pre-mod.    Electrical Stimulation Parameters 80-150 Hz x 20 minutes.    Electrical Stimulation Goals Pain      Ultrasound   Ultrasound Location Right affected Achilles.    Ultrasound Parameters Small soundhead combo e'stim/US at 1.50 W/CM2 x 12 minutes.      Manual Therapy   Manual Therapy Soft tissue mobilization    Manual therapy comments Gentle STW/M x 11 minutes to patient's right Achilles and calf.                       PT Long Term Goals - 02/02/21 1214      PT LONG TERM GOAL #1   Title Independent with a HEP.    Time 4  Period Weeks    Status New      PT LONG TERM GOAL #2   Title Increase right ankle dorsiflexion to 6- 8 degrees to normalize the patient's gait pattern.    Time 4    Period Weeks    Status New      PT LONG TERM GOAL #3   Title Walk a community distance without CAM walker/boot.    Time 4    Period Weeks    Status New      PT LONG TERM GOAL #4   Title Perform a reciprocating stair gait with one railing.    Time 4    Period Weeks    Status New      PT LONG TERM GOAL #5   Title Perform ADL's with right ankle pain not > 2/10.    Time 4    Period Weeks    Status New                 Plan - 02/09/21 1341    Clinical Impression Statement Patient doing better with less palpable right Achilles pain.  She has been doing the ankle "ABC" exercise at home.    Personal Factors and Comorbidities Comorbidity 1;Other    Comorbidities SVT, DM, h/o right heel pain.    Examination-Activity Limitations  Other;Locomotion Level    Examination-Participation Restrictions Other    Stability/Clinical Decision Making Stable/Uncomplicated    Rehab Potential Excellent    PT Frequency 3x / week    PT Duration 4 weeks    PT Treatment/Interventions ADLs/Self Care Home Management;Cryotherapy;Electrical Stimulation;Ultrasound;Moist Heat;Iontophoresis 4mg /ml Dexamethasone;Gait training;Stair training;Functional mobility training;Therapeutic activities;Therapeutic exercise;Neuromuscular re-education;Manual techniques;Patient/family education;Passive range of motion;Vasopneumatic Device    PT Next Visit Plan Gentle STW/M to patient's right calf/Achilles, seated Rockerboard/BAPS board, low-level combo e'stim/US, low-level e'stim/Vaso.  Progress to standing activites per updated MD order.    Consulted and Agree with Plan of Care Patient           Patient will benefit from skilled therapeutic intervention in order to improve the following deficits and impairments:  Abnormal gait,Pain,Decreased activity tolerance,Decreased range of motion,Increased edema  Visit Diagnosis: Pain in right ankle and joints of right foot  Stiffness of right ankle, not elsewhere classified     Problem List Patient Active Problem List   Diagnosis Date Noted  . E coli bacteremia 03/22/2020  . Bacteremia due to Gram-negative bacteria 03/20/2020  . Hypothyroidism   . Pyelonephritis 02/28/2020  . Intractable nausea and vomiting 02/28/2020  . Nasal sinus congestion 03/15/2018  . Subclinical hypothyroidism 08/05/2017  . Hyperlipidemia 07/28/2016  . Type 2 diabetes mellitus with hyperglycemia, without long-term current use of insulin (HCC) 04/15/2016  . Sinus tachycardia 11/12/2015  . S/P radiofrequency ablation operation for arrhythmia 02/24/2015  . SVT (supraventricular tachycardia) (HCC) 08/13/2014  . Palpitations 08/13/2014  . Obesity, Class I, BMI 30-34.9 09/30/2013  . HTN (hypertension) 08/01/2012    Chitara Clonch, 08/03/2012  MPT 02/09/2021, 1:53 PM  [facility] [address] [place], [place], [place] Phone: [phone]   Fax:  [phone]  Name: [name] MRN: [name] Date of Birth: [birth year]

## 2021-02-11 ENCOUNTER — Encounter: Payer: 59 | Admitting: Physical Therapy

## 2021-03-18 ENCOUNTER — Emergency Department (HOSPITAL_COMMUNITY): Payer: 59

## 2021-03-18 ENCOUNTER — Encounter (HOSPITAL_COMMUNITY)
Admission: EM | Disposition: A | Discharge status: Home / Self Care | Payer: Self-pay | Source: Home / Self Care | Attending: Emergency Medicine

## 2021-03-18 ENCOUNTER — Other Ambulatory Visit: Payer: Self-pay

## 2021-03-18 ENCOUNTER — Emergency Department (HOSPITAL_COMMUNITY): Payer: 59 | Admitting: Anesthesiology

## 2021-03-18 ENCOUNTER — Encounter (HOSPITAL_COMMUNITY): Payer: Self-pay | Admitting: Emergency Medicine

## 2021-03-18 ENCOUNTER — Ambulatory Visit (HOSPITAL_COMMUNITY)
Admission: EM | Admit: 2021-03-18 | Discharge: 2021-03-19 | Disposition: A | Discharge status: Home / Self Care | Payer: 59 | Attending: General Surgery | Admitting: General Surgery

## 2021-03-18 DIAGNOSIS — K37 Unspecified appendicitis: Secondary | ICD-10-CM | POA: Diagnosis present

## 2021-03-18 DIAGNOSIS — Z20822 Contact with and (suspected) exposure to covid-19: Secondary | ICD-10-CM | POA: Insufficient documentation

## 2021-03-18 DIAGNOSIS — I10 Essential (primary) hypertension: Secondary | ICD-10-CM | POA: Insufficient documentation

## 2021-03-18 DIAGNOSIS — Z8616 Personal history of COVID-19: Secondary | ICD-10-CM | POA: Insufficient documentation

## 2021-03-18 DIAGNOSIS — E785 Hyperlipidemia, unspecified: Secondary | ICD-10-CM | POA: Insufficient documentation

## 2021-03-18 DIAGNOSIS — Z87891 Personal history of nicotine dependence: Secondary | ICD-10-CM | POA: Insufficient documentation

## 2021-03-18 DIAGNOSIS — K353 Acute appendicitis with localized peritonitis, without perforation or gangrene: Secondary | ICD-10-CM

## 2021-03-18 DIAGNOSIS — Z882 Allergy status to sulfonamides status: Secondary | ICD-10-CM | POA: Insufficient documentation

## 2021-03-18 DIAGNOSIS — Z8249 Family history of ischemic heart disease and other diseases of the circulatory system: Secondary | ICD-10-CM | POA: Insufficient documentation

## 2021-03-18 DIAGNOSIS — K3533 Acute appendicitis with perforation and localized peritonitis, with abscess: Secondary | ICD-10-CM | POA: Diagnosis not present

## 2021-03-18 DIAGNOSIS — Z888 Allergy status to other drugs, medicaments and biological substances status: Secondary | ICD-10-CM | POA: Diagnosis not present

## 2021-03-18 HISTORY — PX: LAPAROSCOPIC APPENDECTOMY: SHX408

## 2021-03-18 LAB — CBC WITH DIFFERENTIAL/PLATELET
Abs Immature Granulocytes: 0.05 10*3/uL (ref 0.00–0.07)
Basophils Absolute: 0.1 10*3/uL (ref 0.0–0.1)
Basophils Relative: 1 %
Eosinophils Absolute: 0.2 10*3/uL (ref 0.0–0.5)
Eosinophils Relative: 2 %
HCT: 42.6 % (ref 36.0–46.0)
Hemoglobin: 14.5 g/dL (ref 12.0–15.0)
Immature Granulocytes: 0 %
Lymphocytes Relative: 19 %
Lymphs Abs: 2.1 10*3/uL (ref 0.7–4.0)
MCH: 28.5 pg (ref 26.0–34.0)
MCHC: 34 g/dL (ref 30.0–36.0)
MCV: 83.9 fL (ref 80.0–100.0)
Monocytes Absolute: 0.7 10*3/uL (ref 0.1–1.0)
Monocytes Relative: 7 %
Neutro Abs: 8.1 10*3/uL — ABNORMAL HIGH (ref 1.7–7.7)
Neutrophils Relative %: 71 %
Platelets: 245 10*3/uL (ref 150–400)
RBC: 5.08 MIL/uL (ref 3.87–5.11)
RDW: 13 % (ref 11.5–15.5)
WBC: 11.3 10*3/uL — ABNORMAL HIGH (ref 4.0–10.5)
nRBC: 0 % (ref 0.0–0.2)

## 2021-03-18 LAB — COMPREHENSIVE METABOLIC PANEL
ALT: 18 U/L (ref 0–44)
AST: 20 U/L (ref 15–41)
Albumin: 4.3 g/dL (ref 3.5–5.0)
Alkaline Phosphatase: 166 U/L — ABNORMAL HIGH (ref 38–126)
Anion gap: 13 (ref 5–15)
BUN: 11 mg/dL (ref 6–20)
CO2: 26 mmol/L (ref 22–32)
Calcium: 9.6 mg/dL (ref 8.9–10.3)
Chloride: 96 mmol/L — ABNORMAL LOW (ref 98–111)
Creatinine, Ser: 0.77 mg/dL (ref 0.44–1.00)
GFR, Estimated: >60 mL/min (ref >60)
Glucose, Bld: 304 mg/dL — ABNORMAL HIGH (ref 70–99)
Potassium: 3.3 mmol/L — ABNORMAL LOW (ref 3.5–5.1)
Sodium: 135 mmol/L (ref 135–145)
Total Bilirubin: 1 mg/dL (ref 0.3–1.2)
Total Protein: 8.3 g/dL — ABNORMAL HIGH (ref 6.5–8.1)

## 2021-03-18 LAB — RESP PANEL BY RT-PCR (FLU A&B, COVID) ARPGX2
Influenza A by PCR: NEGATIVE
Influenza B by PCR: NEGATIVE
SARS Coronavirus 2 by RT PCR: NEGATIVE

## 2021-03-18 LAB — LIPASE, BLOOD: Lipase: 35 U/L (ref 11–51)

## 2021-03-18 LAB — GLUCOSE, CAPILLARY
Glucose-Capillary: 297 mg/dL — ABNORMAL HIGH (ref 70–99)
Glucose-Capillary: 388 mg/dL — ABNORMAL HIGH (ref 70–99)
Glucose-Capillary: 353 mg/dL — ABNORMAL HIGH (ref 70–99)

## 2021-03-18 SURGERY — APPENDECTOMY, LAPAROSCOPIC
Anesthesia: General

## 2021-03-18 MED ORDER — DEXAMETHASONE SODIUM PHOSPHATE 10 MG/ML IJ SOLN
INTRAMUSCULAR | Status: DC | PRN
  Administered 2021-03-18: 4 mg via INTRAVENOUS

## 2021-03-18 MED ORDER — FENTANYL CITRATE (PF) 250 MCG/5ML IJ SOLN
INTRAMUSCULAR | Status: DC | PRN
  Administered 2021-03-18: 100 ug via INTRAVENOUS
  Administered 2021-03-18: 50 ug via INTRAVENOUS

## 2021-03-18 MED ORDER — PHENYLEPHRINE 40 MCG/ML (10ML) SYRINGE FOR IV PUSH (FOR BLOOD PRESSURE SUPPORT)
PREFILLED_SYRINGE | INTRAVENOUS | Status: DC | PRN
  Administered 2021-03-18 (×2): 120 ug via INTRAVENOUS

## 2021-03-18 MED ORDER — CHLORHEXIDINE GLUCONATE 0.12 % MT SOLN
15.0000 mL | Freq: Once | OROMUCOSAL | Status: AC
  Administered 2021-03-18: 15 mL via OROMUCOSAL

## 2021-03-18 MED ORDER — SUGAMMADEX SODIUM 200 MG/2ML IV SOLN
INTRAVENOUS | Status: DC | PRN
  Administered 2021-03-18: 200 mg via INTRAVENOUS

## 2021-03-18 MED ORDER — TRIMETHOPRIM 100 MG PO TABS
100.0000 mg | ORAL_TABLET | Freq: Every day | ORAL | Status: DC
  Administered 2021-03-19: 100 mg via ORAL
  Filled 2021-03-18: qty 1

## 2021-03-18 MED ORDER — ROCURONIUM BROMIDE 10 MG/ML (PF) SYRINGE
PREFILLED_SYRINGE | INTRAVENOUS | Status: DC | PRN
  Administered 2021-03-18: 5 mg via INTRAVENOUS
  Administered 2021-03-18: 35 mg via INTRAVENOUS

## 2021-03-18 MED ORDER — DEXAMETHASONE SODIUM PHOSPHATE 10 MG/ML IJ SOLN
INTRAMUSCULAR | Status: AC
  Filled 2021-03-18: qty 1

## 2021-03-18 MED ORDER — HYDROCODONE-ACETAMINOPHEN 5-325 MG PO TABS
1.0000 | ORAL_TABLET | ORAL | Status: DC | PRN
  Administered 2021-03-18: 2 via ORAL
  Administered 2021-03-19: 1 via ORAL
  Filled 2021-03-18: qty 1
  Filled 2021-03-18: qty 2

## 2021-03-18 MED ORDER — PROPOFOL 10 MG/ML IV BOLUS
INTRAVENOUS | Status: AC
  Filled 2021-03-18: qty 20

## 2021-03-18 MED ORDER — IOHEXOL 300 MG/ML  SOLN
100.0000 mL | Freq: Once | INTRAMUSCULAR | Status: AC | PRN
  Administered 2021-03-18: 100 mL via INTRAVENOUS

## 2021-03-18 MED ORDER — 0.9 % SODIUM CHLORIDE (POUR BTL) OPTIME
TOPICAL | Status: DC | PRN
  Administered 2021-03-18: 1000 mL

## 2021-03-18 MED ORDER — MIDAZOLAM HCL 2 MG/2ML IJ SOLN
INTRAMUSCULAR | Status: AC
  Filled 2021-03-18: qty 2

## 2021-03-18 MED ORDER — LORATADINE 10 MG PO TABS: 10.0000 mg | ORAL_TABLET | Freq: Every day | ORAL | Status: DC | PRN

## 2021-03-18 MED ORDER — OXYCODONE HCL 5 MG/5ML PO SOLN
5.0000 mg | Freq: Once | ORAL | Status: DC | PRN
Start: 2021-03-18 — End: 2021-03-18

## 2021-03-18 MED ORDER — PROPRANOLOL HCL 10 MG PO TABS
10.0000 mg | ORAL_TABLET | Freq: Four times a day (QID) | ORAL | Status: DC | PRN
  Filled 2021-03-18: qty 1

## 2021-03-18 MED ORDER — FUROSEMIDE 20 MG PO TABS: 20.0000 mg | ORAL_TABLET | Freq: Every day | ORAL | Status: DC | PRN

## 2021-03-18 MED ORDER — BUPIVACAINE-EPINEPHRINE 0.25% -1:200000 IJ SOLN
INTRAMUSCULAR | Status: DC | PRN
  Administered 2021-03-18: 30 mL

## 2021-03-18 MED ORDER — OXYCODONE HCL 5 MG PO TABS: 5.0000 mg | ORAL_TABLET | Freq: Once | ORAL | Status: DC | PRN

## 2021-03-18 MED ORDER — LIDOCAINE 2% (20 MG/ML) 5 ML SYRINGE
INTRAMUSCULAR | Status: DC | PRN
  Administered 2021-03-18: 80 mg via INTRAVENOUS

## 2021-03-18 MED ORDER — ONDANSETRON HCL 4 MG/2ML IJ SOLN: 4.0000 mg | Freq: Four times a day (QID) | INTRAMUSCULAR | Status: DC | PRN

## 2021-03-18 MED ORDER — LEVOTHYROXINE SODIUM 88 MCG PO TABS
88.0000 ug | ORAL_TABLET | Freq: Every day | ORAL | Status: DC
  Administered 2021-03-19: 88 ug via ORAL
  Filled 2021-03-18: qty 1

## 2021-03-18 MED ORDER — BUPIVACAINE-EPINEPHRINE (PF) 0.25% -1:200000 IJ SOLN
INTRAMUSCULAR | Status: AC
  Filled 2021-03-18: qty 30

## 2021-03-18 MED ORDER — SUCCINYLCHOLINE CHLORIDE 200 MG/10ML IV SOSY
PREFILLED_SYRINGE | INTRAVENOUS | Status: AC
  Filled 2021-03-18: qty 10

## 2021-03-18 MED ORDER — INSULIN ASPART 100 UNIT/ML ~~LOC~~ SOLN
SUBCUTANEOUS | Status: AC
  Administered 2021-03-18: 5 [IU] via SUBCUTANEOUS
  Filled 2021-03-18: qty 1

## 2021-03-18 MED ORDER — FENTANYL CITRATE (PF) 100 MCG/2ML IJ SOLN
25.0000 ug | INTRAMUSCULAR | Status: DC | PRN
  Administered 2021-03-18: 50 ug via INTRAVENOUS

## 2021-03-18 MED ORDER — ONDANSETRON HCL 4 MG/2ML IJ SOLN
INTRAMUSCULAR | Status: DC | PRN
  Administered 2021-03-18: 4 mg via INTRAVENOUS

## 2021-03-18 MED ORDER — ONDANSETRON HCL 4 MG/2ML IJ SOLN
4.0000 mg | Freq: Once | INTRAMUSCULAR | Status: AC
  Administered 2021-03-18: 4 mg via INTRAVENOUS
  Filled 2021-03-18: qty 2

## 2021-03-18 MED ORDER — LIDOCAINE 2% (20 MG/ML) 5 ML SYRINGE
INTRAMUSCULAR | Status: AC
  Filled 2021-03-18: qty 5

## 2021-03-18 MED ORDER — LACTATED RINGERS IR SOLN
Status: DC | PRN
  Administered 2021-03-18: 1000 mL

## 2021-03-18 MED ORDER — ROCURONIUM BROMIDE 10 MG/ML (PF) SYRINGE
PREFILLED_SYRINGE | INTRAVENOUS | Status: AC
  Filled 2021-03-18: qty 10

## 2021-03-18 MED ORDER — PANTOPRAZOLE SODIUM 40 MG PO TBEC
40.0000 mg | DELAYED_RELEASE_TABLET | Freq: Two times a day (BID) | ORAL | Status: DC
  Administered 2021-03-18 – 2021-03-19 (×2): 40 mg via ORAL
  Filled 2021-03-18 (×2): qty 1

## 2021-03-18 MED ORDER — FENTANYL CITRATE (PF) 250 MCG/5ML IJ SOLN
INTRAMUSCULAR | Status: AC
  Filled 2021-03-18: qty 5

## 2021-03-18 MED ORDER — MIDAZOLAM HCL 5 MG/5ML IJ SOLN
INTRAMUSCULAR | Status: DC | PRN
  Administered 2021-03-18: 2 mg via INTRAVENOUS

## 2021-03-18 MED ORDER — SODIUM CHLORIDE 0.9 % IV SOLN
2.0000 g | Freq: Once | INTRAVENOUS | Status: AC
  Administered 2021-03-18: 2 g via INTRAVENOUS
  Filled 2021-03-18: qty 20

## 2021-03-18 MED ORDER — FENTANYL CITRATE (PF) 100 MCG/2ML IJ SOLN: 25.0000 ug | INTRAMUSCULAR | Status: DC | PRN

## 2021-03-18 MED ORDER — MORPHINE SULFATE (PF) 2 MG/ML IV SOLN: 1.0000 mg | INTRAVENOUS | Status: DC | PRN

## 2021-03-18 MED ORDER — INSULIN ASPART 100 UNIT/ML ~~LOC~~ SOLN
0.0000 [IU] | Freq: Three times a day (TID) | SUBCUTANEOUS | Status: DC
  Administered 2021-03-18: 9 [IU] via SUBCUTANEOUS
  Administered 2021-03-19: 7 [IU] via SUBCUTANEOUS

## 2021-03-18 MED ORDER — PROPOFOL 10 MG/ML IV BOLUS
INTRAVENOUS | Status: DC | PRN
  Administered 2021-03-18: 180 mg via INTRAVENOUS

## 2021-03-18 MED ORDER — HEPARIN SODIUM (PORCINE) 5000 UNIT/ML IJ SOLN
5000.0000 [IU] | Freq: Three times a day (TID) | INTRAMUSCULAR | Status: DC
  Administered 2021-03-19: 5000 [IU] via SUBCUTANEOUS
  Filled 2021-03-18: qty 1

## 2021-03-18 MED ORDER — SUCCINYLCHOLINE CHLORIDE 200 MG/10ML IV SOSY
PREFILLED_SYRINGE | INTRAVENOUS | Status: DC | PRN
  Administered 2021-03-18: 80 mg via INTRAVENOUS

## 2021-03-18 MED ORDER — LACTATED RINGERS IV SOLN: INTRAVENOUS | Status: DC

## 2021-03-18 MED ORDER — ONDANSETRON HCL 4 MG/2ML IJ SOLN: 4.0000 mg | Freq: Once | INTRAMUSCULAR | Status: DC | PRN

## 2021-03-18 MED ORDER — INSULIN ASPART 100 UNIT/ML ~~LOC~~ SOLN: 5.0000 [IU] | SUBCUTANEOUS | Status: AC

## 2021-03-18 MED ORDER — METFORMIN HCL 500 MG PO TABS: 1000.0000 mg | ORAL_TABLET | Freq: Two times a day (BID) | ORAL | Status: DC

## 2021-03-18 MED ORDER — LORAZEPAM 2 MG/ML IJ SOLN
0.5000 mg | Freq: Once | INTRAMUSCULAR | Status: AC | PRN
  Administered 2021-03-18: 0.5 mg via INTRAVENOUS
  Filled 2021-03-18: qty 1

## 2021-03-18 MED ORDER — ONDANSETRON 4 MG PO TBDP: 4.0000 mg | ORAL_TABLET | Freq: Four times a day (QID) | ORAL | Status: DC | PRN

## 2021-03-18 MED ORDER — FENTANYL CITRATE (PF) 100 MCG/2ML IJ SOLN
INTRAMUSCULAR | Status: AC
  Filled 2021-03-18: qty 2

## 2021-03-18 MED ORDER — AMISULPRIDE (ANTIEMETIC) 5 MG/2ML IV SOLN
10.0000 mg | Freq: Once | INTRAVENOUS | Status: DC | PRN
Start: 2021-03-18 — End: 2021-03-18

## 2021-03-18 MED ORDER — SODIUM CHLORIDE 0.9 % IV SOLN: INTRAVENOUS | Status: DC

## 2021-03-18 MED ORDER — PHENYLEPHRINE 40 MCG/ML (10ML) SYRINGE FOR IV PUSH (FOR BLOOD PRESSURE SUPPORT)
PREFILLED_SYRINGE | INTRAVENOUS | Status: AC
  Filled 2021-03-18: qty 10

## 2021-03-18 MED ORDER — METRONIDAZOLE IN NACL 5-0.79 MG/ML-% IV SOLN
500.0000 mg | Freq: Once | INTRAVENOUS | Status: AC
Start: 2021-03-18 — End: 2021-03-18
  Administered 2021-03-18: 500 mg via INTRAVENOUS
  Filled 2021-03-18: qty 100

## 2021-03-18 MED ORDER — METOPROLOL SUCCINATE ER 50 MG PO TB24
50.0000 mg | ORAL_TABLET | Freq: Every day | ORAL | Status: DC
  Administered 2021-03-18 – 2021-03-19 (×2): 50 mg via ORAL
  Filled 2021-03-18 (×2): qty 1

## 2021-03-18 SURGICAL SUPPLY — 30 items
APPLIER CLIP ROT 10 11.4 M/L (STAPLE) ×2
CABLE HIGH FREQUENCY MONO STRZ (ELECTRODE) ×2 IMPLANT
CHLORAPREP W/TINT 26 (MISCELLANEOUS) ×2 IMPLANT
CLIP APPLIE ROT 10 11.4 M/L (STAPLE) ×1 IMPLANT
COVER WAND RF STERILE (DRAPES) ×2 IMPLANT
CUTTER FLEX LINEAR 45M (STAPLE) ×2 IMPLANT
DECANTER SPIKE VIAL GLASS SM (MISCELLANEOUS) ×2 IMPLANT
DERMABOND ADVANCED (GAUZE/BANDAGES/DRESSINGS) ×1
DERMABOND ADVANCED .7 DNX12 (GAUZE/BANDAGES/DRESSINGS) ×1 IMPLANT
ELECT REM PT RETURN 15FT ADLT (MISCELLANEOUS) ×2 IMPLANT
ENDOLOOP SUT PDS II  0 18 (SUTURE)
ENDOLOOP SUT PDS II 0 18 (SUTURE) IMPLANT
GLOVE SURG ENC MOIS LTX SZ7.5 (GLOVE) ×2 IMPLANT
GOWN STRL REUS W/TWL XL LVL3 (GOWN DISPOSABLE) ×4 IMPLANT
KIT BASIN OR (CUSTOM PROCEDURE TRAY) ×2 IMPLANT
KIT TURNOVER KIT A (KITS) ×2 IMPLANT
PENCIL SMOKE EVACUATOR (MISCELLANEOUS) IMPLANT
POUCH SPECIMEN RETRIEVAL 10MM (ENDOMECHANICALS) ×2 IMPLANT
RELOAD 45 VASCULAR/THIN (ENDOMECHANICALS) IMPLANT
RELOAD STAPLE TA45 3.5 REG BLU (ENDOMECHANICALS) ×2 IMPLANT
SCISSORS LAP 5X35 DISP (ENDOMECHANICALS) ×2 IMPLANT
SET IRRIG TUBING LAPAROSCOPIC (IRRIGATION / IRRIGATOR) ×2 IMPLANT
SET TUBE SMOKE EVAC HIGH FLOW (TUBING) ×2 IMPLANT
SHEARS HARMONIC ACE PLUS 36CM (ENDOMECHANICALS) ×2 IMPLANT
SUT MNCRL AB 4-0 PS2 18 (SUTURE) ×2 IMPLANT
TOWEL OR 17X26 10 PK STRL BLUE (TOWEL DISPOSABLE) ×2 IMPLANT
TRAY FOLEY MTR SLVR 16FR STAT (SET/KITS/TRAYS/PACK) ×2 IMPLANT
TRAY LAPAROSCOPIC (CUSTOM PROCEDURE TRAY) ×2 IMPLANT
TROCAR BLADELESS OPT 5 100 (ENDOMECHANICALS) ×2 IMPLANT
TROCAR XCEL BLUNT TIP 100MML (ENDOMECHANICALS) ×2 IMPLANT

## 2021-03-18 NOTE — Addendum Note (Signed)
Addendum  created 03/18/21 1307 by [name], MD   Order list changed, Order sets accessed, Pharmacy for encounter modified

## 2021-03-18 NOTE — OR PostOp (Incomplete)
PACU TO INPATIENT HANDOFF REPORT  Name/Age/Gender Julia [name] 50 y.o. female  Code Status    Code Status Orders  (From admission, onward)         Start     Ordered   03/18/21 1343  Full code  Continuous        03/18/21 1342        Code Status History    Date Active Date Inactive Code Status Order ID Comments User Context   03/18/2020 2340 03/23/2020 0034 Full Code [id number]  Hillary BowGardner, Jared M, DO ED   02/28/2020 1943 03/02/2020 1842 Full Code [id number]  Cathren Harshai, Ripudeep K, MD Inpatient   02/24/2015 1107 02/24/2015 2328 Full Code [id number]  Marinus Mawaylor, Gregg W, MD Inpatient   Advance Care Planning Activity      Home/SNF/Other {Discharge Destination:18313::"Home"}  Chief Complaint Acute appendicitis with localized peritonitis, without perforation, abscess, or gangrene [K35.30] Appendicitis [K37]  Level of Care/Admitting Diagnosis ED Disposition    ED Disposition Condition Comment   Admit  The patient appears reasonably stabilized for admission considering the current resources, flow, and capabilities available in the ED at this time, and I doubt any other [facility] requiring further screening and/or treatment in the ED prior to admission is  present.       Medical History Past Medical History:  Diagnosis Date  . Complication of anesthesia    I had a reaction to a epidural "broke out"  . Condyloma acuminatum of vulva   . COVID-19   . GERD (gastroesophageal reflux disease)   . History of supraventricular tachycardia   . HSV-1 infection   . HSV-2 infection   . HTN (hypertension)   . Hyperlipidemia   . Type 2 diabetes mellitus (HCC)     Allergies Allergies  Allergen Reactions  . Elemental Sulfur Other (See Comments)    blisters  . Quinolones Other (See Comments)    Cannot take!  . Sulfa Antibiotics Hives  . Trulicity [Dulaglutide]     Abdominal pain  . Hctz [Hydrochlorothiazide] Rash    Possible photosensitivity dermatitis  . Invokana [Canagliflozin] Rash    Possible  photosensitivity dermatitis    IV Location/Drains/Wounds Patient Lines/Drains/Airways Status    Active Line/Drains/Airways    Name Placement date Placement time Site Days   Peripheral IV 03/18/21 Right Wrist 03/18/21  0440  Wrist  less than 1   Peripheral IV 03/18/21 Right Forearm 03/18/21  1000  Forearm  less than 1   Incision (Closed) 03/18/21 Abdomen Other (Comment) 03/18/21  1113  - less than 1   Incision - 3 Ports Abdomen Left;Lateral Umbilicus Distal;Mid 03/18/21  1045  - less than 1          Labs/Imaging Results for orders placed or performed during the hospital encounter of 03/18/21 (from the past 48 hour(s))  CBC with Differential     Status: Abnormal   Collection Time: 03/18/21  4:30 AM  Result Value Ref Range   WBC 11.3 (H) 4.0 - 10.5 K/uL   RBC 5.08 3.87 - 5.11 MIL/uL   Hemoglobin 14.5 12.0 - 15.0 g/dL   HCT 08.642.6 57.836.0 - 46.946.0 %   MCV 83.9 80.0 - 100.0 fL   MCH 28.5 26.0 - 34.0 pg   MCHC 34.0 30.0 - 36.0 g/dL   RDW 62.913.0 52.811.5 - 41.315.5 %   Platelets 245 150 - 400 K/uL   nRBC 0.0 0.0 - 0.2 %   Neutrophils Relative % 71 %   Neutro Abs 8.1 (H)  1.7 - 7.7 K/uL   Lymphocytes Relative 19 %   Lymphs Abs 2.1 0.7 - 4.0 K/uL   Monocytes Relative 7 %   Monocytes Absolute 0.7 0.1 - 1.0 K/uL   Eosinophils Relative 2 %   Eosinophils Absolute 0.2 0.0 - 0.5 K/uL   Basophils Relative 1 %   Basophils Absolute 0.1 0.0 - 0.1 K/uL   Immature Granulocytes 0 %   Abs Immature Granulocytes 0.05 0.00 - 0.07 K/uL    Comment: Performed at [facility], [address] [place], [place] [address]  Comprehensive metabolic panel     Status: Abnormal   Collection Time: 03/18/21  4:30 AM  Result Value Ref Range   Sodium 135 135 - 145 mmol/L   Potassium 3.3 (L) 3.5 - 5.1 mmol/L   Chloride 96 (L) 98 - 111 mmol/L   CO2 26 22 - 32 mmol/L   Glucose, Bld 304 (H) 70 - 99 mg/dL    Comment: Glucose reference range applies only to samples taken after fasting for at least 8 hours.    BUN 11 6 - 20 mg/dL   Creatinine, Ser 1.09 0.44 - 1.00 mg/dL   Calcium 9.6 8.9 - 32.3 mg/dL   Total Protein 8.3 (H) 6.5 - 8.1 g/dL   Albumin 4.3 3.5 - 5.0 g/dL   AST 20 15 - 41 U/L   ALT 18 0 - 44 U/L   Alkaline Phosphatase 166 (H) 38 - 126 U/L   Total Bilirubin 1.0 0.3 - 1.2 mg/dL   GFR, Estimated >55 >73 mL/min    Comment: (NOTE) Calculated using the CKD-EPI Creatinine Equation (2021)    Anion gap 13 5 - 15    Comment: Performed at [facility], [address] [place], [place] [address]  Lipase, blood     Status: None   Collection Time: 03/18/21  4:30 AM  Result Value Ref Range   Lipase 35 11 - 51 U/L    Comment: Performed at [facility], [address], [place], [place] [address]  Resp Panel by RT-PCR (Flu A&B, Covid) Nasopharyngeal Swab     Status: None   Collection Time: 03/18/21  7:09 AM   Specimen: Nasopharyngeal Swab; Nasopharyngeal(NP) swabs in vial transport medium  Result Value Ref Range   SARS Coronavirus 2 by RT PCR NEGATIVE NEGATIVE    Comment: (NOTE) SARS-CoV-2 target nucleic acids are NOT DETECTED.  The SARS-CoV-2 RNA is generally detectable in upper respiratory specimens during the acute phase of infection. The lowest concentration of SARS-CoV-2 viral copies this assay can detect is 138 copies/mL. A negative result does not preclude SARS-Cov-2 infection and should not be used as the sole basis for treatment or other patient management decisions. A negative result may occur with  improper specimen collection/handling, submission of specimen other than nasopharyngeal swab, presence of viral mutation(s) within the areas targeted by this assay, and inadequate number of viral copies(<138 copies/mL). A negative result must be combined with clinical observations, patient history, and epidemiological information. The expected result is Negative.  Fact Sheet for Patients:  BloggerCourse.com  Fact Sheet  for Healthcare Providers:  SeriousBroker.it  This test is no t yet approved or cleared by the [place] FDA and  has been authorized for detection and/or diagnosis of SARS-CoV-2 by FDA under an Emergency Use Authorization (EUA). This EUA will remain  in effect (meaning this test can be used) for the duration of the COVID-19 declaration under Section 564(b)(1) of the  Act, 21 U.S.C.section 360bbb-3(b)(1), unless the authorization is terminated  or revoked sooner.       Influenza A by PCR NEGATIVE NEGATIVE   Influenza B by PCR NEGATIVE NEGATIVE    Comment: (NOTE) The Xpert Xpress SARS-CoV-2/FLU/RSV plus assay is intended as an aid in the diagnosis of influenza from Nasopharyngeal swab specimens and should not be used as a sole basis for treatment. Nasal washings and aspirates are unacceptable for Xpert Xpress SARS-CoV-2/FLU/RSV testing.  Fact Sheet for Patients: BloggerCourse.com  Fact Sheet for Healthcare Providers: SeriousBroker.it  This test is not yet approved or cleared by the [place] FDA and has been authorized for detection and/or diagnosis of SARS-CoV-2 by FDA under an Emergency Use Authorization (EUA). This EUA will remain in effect (meaning this test can be used) for the duration of the COVID-19 declaration under Section 564(b)(1) of the Act, 21 U.S.C. section 360bbb-3(b)(1), unless the authorization is terminated or revoked.  Performed at [facility], [address], [place], [place] [address]   Glucose, capillary     Status: Abnormal   Collection Time: 03/18/21  9:54 AM  Result Value Ref Range   Glucose-Capillary 297 (H) 70 - 99 mg/dL    Comment: Glucose reference range applies only to samples taken after fasting for at least 8 hours.   CT ABDOMEN PELVIS W CONTRAST  Result Date: 03/18/2021 CLINICAL DATA:  Right lower quadrant abdominal pain, fever, nausea  EXAM: CT ABDOMEN AND PELVIS WITH CONTRAST TECHNIQUE: Multidetector CT imaging of the abdomen and pelvis was performed using the standard protocol following bolus administration of intravenous contrast. CONTRAST:  OMNIPAQUE IOHEXOL 300 MG/ML  SOLN COMPARISON:  03/18/2020 FINDINGS: Lower chest: The visualized lung bases are clear. The visualized heart and pericardium are unremarkable. Hepatobiliary: No focal liver abnormality is seen. No gallstones, gallbladder wall thickening, or biliary dilatation. Pancreas: Unremarkable Spleen: Unremarkable Adrenals/Urinary Tract: Adrenal glands are unremarkable. Kidneys are normal, without renal calculi, focal lesion, or hydronephrosis. Bladder is unremarkable. Stomach/Bowel: The appendix is dilated, fluid-filled, and hyperemic in keeping with changes of acute appendicitis. The appendix measures up to 12 mm in diameter. There is extensive periappendiceal inflammatory stranding. Hyperdense material is seen within the base of the appendix possibly representing inspissated intraluminal contents, however, a discrete calcified appendicolith is not clearly identified. No periappendiceal fluid collections or free intraperitoneal gas is identified. The stomach and small bowel are unremarkable. Mild descending and sigmoid colonic diverticulosis is noted. The large bowel is otherwise unremarkable. Vascular/Lymphatic: No significant vascular findings are present. No enlarged abdominal or pelvic lymph nodes. Reproductive: Uterus and bilateral adnexa are unremarkable. Other: Tiny fat containing umbilical hernia.  Rectum unremarkable. Musculoskeletal: No acute bone abnormality. No lytic or blastic bone lesion. IMPRESSION: Acute, unruptured appendicitis Appendix: Location: Retrocecal Diameter: 12 mm Appendicolith: Not clearly identified, however, inspissated contents noted at the base of the appendix Mucosal hyper-enhancement: Present Extraluminal gas: Not present Periappendiceal  collection: Not present Mild distal colonic diverticulosis. Electronically Signed   By: Helyn Numbers MD   On: 03/18/2021 06:39    Pending Labs   Vitals/Pain Today's Vitals   03/18/21 1230 03/18/21 1245 03/18/21 1300 03/18/21 1315  BP: 127/90   114/80  Pulse: (!) 106   (!) 104  Resp: 20   18  Temp:    (!) 97.5 F (36.4 C)  TempSrc:      SpO2: 97%   95%  Weight:      PainSc: 5  6  2   2  Isolation Precautions @ISOLATION @  Administered Medications Periop Administered Meds from 03/18/2021 0951 to 03/18/2021 1343      Date/Time Order Dose Route Action Action by Comments    03/18/2021 1054 0.9 % irrigation (POUR BTL) 1,000 mL Irrigation Given 03/20/2021 III, MD     03/18/2021 1054 bupivacaine-EPINEPHrine (MARCAINE W/ EPI) 0.25% -1:200000 (with pres) injection 30 mL Infiltration Given 03/20/2021 III, MD     03/18/2021 1011 chlorhexidine (PERIDEX) 0.12 % solution 15 mL 15 mL Mouth/Throat Given 03/20/2021, RN     03/18/2021 1052 dexamethasone (DECADRON) injection 4 mg Intravenous Given 03/20/2021, CRNA     03/18/2021 1245 fentaNYL (SUBLIMAZE) injection 25-50 mcg 50 mcg Intravenous Given 12-02-2005, RN     03/18/2021 1058 fentaNYL citrate (PF) (SUBLIMAZE) injection 100 mcg Intravenous Given 03/20/2021, CRNA     03/18/2021 1025 fentaNYL citrate (PF) (SUBLIMAZE) injection 50 mcg Intravenous Given 03/20/2021, CRNA     03/18/2021 1019 insulin aspart (novoLOG) injection 5 Units 5 Units Subcutaneous Given 03/20/2021, RN patient going to OR, unable to scan her bracelet    03/18/2021 1133 lactated ringers infusion   Intravenous Anesthesia Volume Adjustment 03/20/2021, CRNA     03/18/2021 1120 lactated ringers infusion   Intravenous New Bag/Given 03/20/2021, CRNA     03/18/2021 1022 lactated ringers infusion   Intravenous Restarted 03/20/2021, CRNA     03/18/2021 1021 lactated ringers infusion   Intravenous Paused 03/20/2021, CRNA Switch  to gravity    03/18/2021 1005 lactated ringers infusion   Intravenous New Bag/Given 03/20/2021, RN     03/18/2021 1054 lactated ringers irrigation solution 1,000 mL Irrigation Given 03/20/2021 III, MD     03/18/2021 1030 lidocaine 2% (20 mg/mL) 5 mL syringe 80 mg Intravenous Given 03/20/2021, CRNA     03/18/2021 1020 midazolam (VERSED) 5 MG/5ML injection 2 mg Intravenous Given 03/20/2021, CRNA     03/18/2021 1113 ondansetron (ZOFRAN) injection 4 mg Intravenous Given 03/20/2021, CRNA     03/18/2021 1048 PHENYLephrine 40 mcg/ml in normal saline Adult IV Push Syringe (For Blood Pressure Support) 120 mcg Intravenous Given 03/20/2021, CRNA     03/18/2021 1041 PHENYLephrine 40 mcg/ml in normal saline Adult IV Push Syringe (For Blood Pressure Support) 120 mcg Intravenous Given 03/20/2021, CRNA     03/18/2021 1030 propofol (DIPRIVAN) 10 mg/mL bolus/IV push 180 mg Intravenous Given 03/20/2021, CRNA     03/18/2021 1039 rocuronium bromide 10 mg/mL (PF) syringe 35 mg Intravenous Given 03/20/2021, CRNA     03/18/2021 1030 rocuronium bromide 10 mg/mL (PF) syringe 5 mg Intravenous Given 03/20/2021, CRNA     03/18/2021 1030 succinylcholine (ANECTINE) syringe 80 mg Intravenous Given 03/20/2021, CRNA     03/18/2021 1118 sugammadex sodium (BRIDION) injection 200 mg Intravenous Given 03/20/2021, CRNA       Mobility {Mobility:20148}

## 2021-03-18 NOTE — ED Notes (Addendum)
Pt made aware of need for urine specimen. Call light within reach.

## 2021-03-18 NOTE — ED Provider Notes (Signed)
[facility]-EMERGENCY DEPT Provider Note   CSN: [id number] Arrival date & time: 03/18/21  0239     History Chief Complaint  Patient presents with  . Abdominal Pain    Julia [name] is a 50 y.o. female with a history of diabetes mellitus type 2, HLD, HTN, COVID-19 who presents the emergency department with a chief complaint of abdominal pain.  The patient reports a 24-hour history of abdominal pain that began in the upper abdomen that has gradually moved to lower and into the right lower quadrant.  She characterizes the pain as throbbing.  Pain has been constant and continually worsened since onset.  She reports that she feels as if her abdomen is swollen and reports associated nausea.  Tonight, she stood up to walk and the pain significantly worsened with movement.  She checked her temperature and had a fever of 100.9 at home with associated chills, which prompted her visit to the ER.  She took 2 tablets of Tylenol at 00:20 with resolution of her fever.  She denies vomiting, diarrhea, constipation, dysuria, hematuria, vaginal bleeding, or discharge.  She has being followed by urology after she had bilateral pyelonephritis last year.  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  She underwent a urological procedure in the outpatient setting 2 to 3 days ago for hemoglobinuria work-up.  She was having no symptoms associated with the procedure until the abdominal pain began approximately 24 hours ago.  No other treatment prior to arrival.  The history is provided by the patient and medical records. No language interpreter was used.       Past Medical History:  Diagnosis Date  . Complication of anesthesia    I had a reaction to a epidural "broke out"  . Condyloma acuminatum of vulva   . COVID-19   . GERD (gastroesophageal reflux disease)   . History of supraventricular tachycardia   . HSV-1 infection   . HSV-2 infection   . HTN (hypertension)   . Hyperlipidemia    . Type 2 diabetes mellitus [facility])     Patient Active Problem List   Diagnosis Date Noted  . E coli bacteremia 03/22/2020  . Bacteremia due to Gram-negative bacteria 03/20/2020  . Hypothyroidism   . Pyelonephritis 02/28/2020  . Intractable nausea and vomiting 02/28/2020  . Nasal sinus congestion 03/15/2018  . Subclinical hypothyroidism 08/05/2017  . Hyperlipidemia 07/28/2016  . Type 2 diabetes mellitus with hyperglycemia, without long-term current use of insulin (HCC) 04/15/2016  . Sinus tachycardia 11/12/2015  . S/P radiofrequency ablation operation for arrhythmia 02/24/2015  . SVT (supraventricular tachycardia) (HCC) 08/13/2014  . Palpitations 08/13/2014  . Obesity, Class I, BMI 30-34.9 09/30/2013  . HTN (hypertension) 08/01/2012    Past Surgical History:  Procedure Laterality Date  . CARDIOVASCULAR STRESS TEST  06-04-2007   normal nuclear study/  no ischemia/  normal LVF, ef 63%  . CESAREAN SECTION  09-06-2005  . LASER ABLATION CONDOLAMATA N/A 10/08/2015   Procedure: CO2 LASER ABLATION CONDOLAMATA OF VULVA;  Surgeon: Richarda Overlie, MD;  Location: [facility];  Service: Gynecology;  Laterality: N/A;  . SUPRAVENTRICULAR TACHYCARDIA ABLATION N/A 02/24/2015   Procedure: SUPRAVENTRICULAR TACHYCARDIA ABLATION;  Surgeon: Marinus Maw, MD;  Location: [facility] CATH LAB;  Service: Cardiovascular;  Laterality: N/A;  . TRANSTHORACIC ECHOCARDIOGRAM  01-06-2009   normal LVF, ef 55-60%,  trace MR and TR/  normal stress echo     OB History   No obstetric history on file.  Family History  Problem Relation Age of Onset  . Hypertension Mother   . Thyroid disease Mother   . Diabetes Mother   . Thyroid disease Sister   . Diabetes Maternal Grandmother   . Hyperlipidemia Other   . Thyroid disease Other     Social History   Tobacco Use  . Smoking status: Former Smoker    Years: 8.00    Types: Cigarettes    Quit date: 12/16/2002    Years since quitting: 18.2  .  Smokeless tobacco: Never Used  Vaping Use  . Vaping Use: Never used  Substance Use Topics  . Alcohol use: Yes    Alcohol/week: 2.0 standard drinks    Types: 2 Standard drinks or equivalent per week    Comment: rare  . Drug use: No    Home Medications Prior to Admission medications   Medication Sig Start Date End Date Taking? Authorizing Provider  ferrous sulfate 324 MG TBEC Take 324 mg by mouth daily with breakfast.   Yes [provider]  furosemide (LASIX) 40 MG tablet Take 20-40 mg by mouth daily as needed for fluid. 03/21/20  Yes [provider]  Insulin Glargine (BASAGLAR KWIKPEN) 100 UNIT/ML Inject 24-34 Units into the skin at bedtime. 12/16/20  Yes [provider]  ipratropium (ATROVENT) 0.06 % nasal spray Place 2 sprays into both nostrils daily as needed for congestion. 02/22/20  Yes [provider]  levothyroxine (SYNTHROID) 88 MCG tablet Take 88 mcg by mouth every morning. 12/30/20  Yes [provider]  loratadine (CLARITIN) 10 MG tablet Take 10 mg by mouth daily as needed for allergies.   Yes [provider]  metFORMIN (GLUCOPHAGE) 1000 MG tablet Take 1,000 mg by mouth 2 (two) times daily with a meal.   Yes [provider]  metoprolol succinate (TOPROL-XL) 50 MG 24 hr tablet Take 50 mg by mouth in the morning and at bedtime. 02/06/21  Yes [provider]  pantoprazole (PROTONIX) 40 MG tablet Take 40 mg by mouth 2 (two) times daily. 03/09/21  Yes [provider]  propranolol (INDERAL) 10 MG tablet Take 10 mg by mouth 4 (four) times daily as needed (heart papatations).    Yes [provider]  trimethoprim (TRIMPEX) 100 MG tablet Take 100 mg by mouth daily. 03/11/21  Yes [provider]  vitamin B-12 (CYANOCOBALAMIN) 1000 MCG tablet Take 1,000 mcg by mouth daily.   Yes [provider]  glucose blood (ONE TOUCH ULTRA TEST) test strip USE AS DIRECTED 10/04/17   Carlus Pavlov, MD     Allergies    Elemental sulfur, Quinolones, Sulfa antibiotics, Trulicity [dulaglutide], Hctz [hydrochlorothiazide], and Invokana [canagliflozin]  Review of Systems   Review of Systems  Constitutional: Positive for chills and fever. Negative for activity change.  HENT: Negative for congestion and sore throat.   Eyes: Negative for visual disturbance.  Respiratory: Negative for shortness of breath.   Cardiovascular: Negative for chest pain.  Gastrointestinal: Positive for abdominal pain and nausea. Negative for constipation, diarrhea and vomiting.  Genitourinary: Negative for dysuria, flank pain, frequency, urgency, vaginal bleeding, vaginal discharge and vaginal pain.  Musculoskeletal: Negative for back pain, myalgias, neck pain and neck stiffness.  Skin: Negative for rash.  Allergic/Immunologic: Negative for immunocompromised state.  Neurological: Negative for dizziness, seizures, syncope, weakness, numbness and headaches.  Psychiatric/Behavioral: Negative for confusion.    Physical Exam Updated Vital Signs BP 136/76 (BP Location: Right Arm)   Pulse 100   Temp 97.9 F (36.6  C) (Oral)   Resp 18   LMP 07/22/2019   SpO2 97%   Physical Exam Vitals and nursing note reviewed.  Constitutional:      General: She is not in acute distress.    Appearance: She is not ill-appearing, toxic-appearing or diaphoretic.  HENT:     Head: Normocephalic.  Eyes:     Conjunctiva/sclera: Conjunctivae normal.  Cardiovascular:     Rate and Rhythm: Normal rate and regular rhythm.     Heart sounds: No murmur heard. No friction rub. No gallop.   Pulmonary:     Effort: Pulmonary effort is normal. No respiratory distress.     Breath sounds: No stridor. No wheezing, rhonchi or rales.  Chest:     Chest wall: No tenderness.  Abdominal:     General: There is no distension.     Palpations: Abdomen is soft. There is no mass.     Tenderness: There is abdominal tenderness. There is guarding. There is  no right CVA tenderness, left CVA tenderness or rebound.     Hernia: No hernia is present.     Comments: Tender to palpation in the right lower quadrant.  She is tender to palpation over McBurney's point.  There is voluntary guarding.  No rebound.  Abdomen is not distended.  Musculoskeletal:     Cervical back: Neck supple.  Skin:    General: Skin is warm.     Findings: No rash.  Neurological:     Mental Status: She is alert.  Psychiatric:        Behavior: Behavior normal.     ED Results / Procedures / Treatments   Labs (all labs ordered are listed, but only abnormal results are displayed) Labs Reviewed  CBC WITH DIFFERENTIAL/PLATELET - Abnormal; Notable for the following components:      Result Value   WBC 11.3 (*)    Neutro Abs 8.1 (*)    All other components within normal limits  COMPREHENSIVE METABOLIC PANEL - Abnormal; Notable for the following components:   Potassium 3.3 (*)    Chloride 96 (*)    Glucose, Bld 304 (*)    Total Protein 8.3 (*)    Alkaline Phosphatase 166 (*)    All other components within normal limits  RESP PANEL BY RT-PCR (FLU A&B, COVID) ARPGX2  LIPASE, BLOOD  URINALYSIS, ROUTINE W REFLEX MICROSCOPIC    EKG None  Radiology CT ABDOMEN PELVIS W CONTRAST  Result Date: 03/18/2021 CLINICAL DATA:  Right lower quadrant abdominal pain, fever, nausea EXAM: CT ABDOMEN AND PELVIS WITH CONTRAST TECHNIQUE: Multidetector CT imaging of the abdomen and pelvis was performed using the standard protocol following bolus administration of intravenous contrast. CONTRAST:  100mL OMNIPAQUE IOHEXOL 300 MG/ML  SOLN COMPARISON:  03/18/2020 FINDINGS: Lower chest: The visualized lung bases are clear. The visualized heart and pericardium are unremarkable. Hepatobiliary: No focal liver abnormality is seen. No gallstones, gallbladder wall thickening, or biliary dilatation. Pancreas: Unremarkable Spleen: Unremarkable Adrenals/Urinary Tract: Adrenal glands are unremarkable. Kidneys are  normal, without renal calculi, focal lesion, or hydronephrosis. Bladder is unremarkable. Stomach/Bowel: The appendix is dilated, fluid-filled, and hyperemic in keeping with changes of acute appendicitis. The appendix measures up to 12 mm in diameter. There is extensive periappendiceal inflammatory stranding. Hyperdense material is seen within the base of the appendix possibly representing inspissated intraluminal contents, however, a discrete calcified appendicolith is not clearly identified. No periappendiceal fluid collections or free intraperitoneal gas is identified. The stomach and small bowel are unremarkable. Mild descending  and sigmoid colonic diverticulosis is noted. The large bowel is otherwise unremarkable. Vascular/Lymphatic: No significant vascular findings are present. No enlarged abdominal or pelvic lymph nodes. Reproductive: Uterus and bilateral adnexa are unremarkable. Other: Tiny fat containing umbilical hernia.  Rectum unremarkable. Musculoskeletal: No acute bone abnormality. No lytic or blastic bone lesion. IMPRESSION: Acute, unruptured appendicitis Appendix: Location: Retrocecal Diameter: 12 mm Appendicolith: Not clearly identified, however, inspissated contents noted at the base of the appendix Mucosal hyper-enhancement: Present Extraluminal gas: Not present Periappendiceal collection: Not present Mild distal colonic diverticulosis. Electronically Signed   By: Helyn Numbers MD   On: 03/18/2021 06:39    Procedures Procedures   Medications Ordered in ED Medications  cefTRIAXone (ROCEPHIN) 2 g in sodium chloride 0.9 % 100 mL IVPB (has no administration in time range)    And  metroNIDAZOLE (FLAGYL) IVPB 500 mg (500 mg Intravenous New Bag/Given 03/18/21 0651)  ondansetron (ZOFRAN) injection 4 mg (4 mg Intravenous Given 03/18/21 0542)  LORazepam (ATIVAN) injection 0.5 mg (0.5 mg Intravenous Given 03/18/21 0542)  iohexol (OMNIPAQUE) 300 MG/ML solution 100 mL (100 mLs Intravenous Contrast  Given 03/18/21 7416)    ED Course  I have reviewed the triage vital signs and the nursing notes.  Pertinent labs & imaging results that were available during my care of the patient were reviewed by me and considered in my medical decision making (see chart for details).    MDM Rules/Calculators/A&P                          50 year old female with a history of diabetes mellitus type 2, HLD, HTN, COVID-19 emergency department with abdominal pain, now present in the right lower, nausea, fever, and chills that began approximately 24 hours ago.  Vital signs are stable.  No concern for appendicitis as the patient is tender over McBurney's point on exam.  Labs and imaging have been reviewed and independently interpreted by me.  She has a mild leukocytosis.  Potassium is slightly low.  She is hyperglycemic to 304 with normal bicarb and anion gap.  She reports that she did not take her home Lantus last night.  CT abdomen pelvis with acute, nonruptured appendicitis.  Will start patient on Rocephin and Flagyl.  She declines pain medication at this time.  Patient care transferred to PA [facility] at the end of my shift to follow-up with general surgery. Patient presentation, ED course, and plan of care discussed with review of all pertinent labs and imaging. Please see his/her note for further details regarding further ED course and disposition.  Final Clinical Impression(s) / ED Diagnoses Final diagnoses:  None    Rx / DC Orders ED Discharge Orders    None       Barkley Boards, PA-C 03/18/21 0758    Pollyann Savoy, MD 03/18/21 2326

## 2021-03-18 NOTE — Transfer of Care (Signed)
Immediate Anesthesia Transfer of Care Note  Patient: Julia [name]  Procedure(s) Performed: APPENDECTOMY LAPAROSCOPIC (N/A )  Patient Location: PACU  Anesthesia Type:General  Level of Consciousness: awake, sedated and responds to stimulation  Airway & Oxygen Therapy: Patient Spontanous Breathing and Patient connected to face mask oxygen  Post-op Assessment: Report given to RN and Post -op Vital signs reviewed and stable  Post vital signs: Reviewed and stable  Last Vitals:  Vitals Value Taken Time  BP 141/89 03/18/21 1130  Temp    Pulse 101 03/18/21 1133  Resp 26 03/18/21 1133  SpO2 99 % 03/18/21 1133  Vitals shown include unvalidated device data.  Last Pain:  Vitals:   03/18/21 1003  TempSrc:   PainSc: 6       Patients Stated Pain Goal: 5 (03/18/21 1003)  Complications: No complications documented.

## 2021-03-18 NOTE — ED Notes (Signed)
Family at bedside. 

## 2021-03-18 NOTE — Plan of Care (Signed)
  Problem: Education: Goal: Required Educational Video(s) Outcome: Progressing   Problem: Clinical Measurements: Goal: Postoperative complications will be avoided or minimized Outcome: Progressing   Problem: Skin Integrity: Goal: Demonstration of wound healing without infection will improve Outcome: Progressing   

## 2021-03-18 NOTE — ED Provider Notes (Signed)
Received signout at beginning of shift.  Please see previous providers note for complete H&P.  This is a 50 year old female presenting with pain to her per medical region for the past 24 hours.  She also has elevated temperature of 100.9 at home.  Her work-up today is remarkable for mildly elevated white count of 11.3 and an abdominal pelvis CT scan demonstrate acute appendicitis without complicating feature.  Patient was started on antibiotic, I have consulted on-call surgeon Dr. Carolynne Edouard who agrees to see and will admit patient for further care.  BP 121/78 (BP Location: Right Arm)   Pulse (!) 102   Temp 97.9 F (36.6 C) (Oral)   Resp 18   LMP 07/22/2019   SpO2 96%   Results for orders placed or performed during the hospital encounter of 03/18/21  Resp Panel by RT-PCR (Flu A&B, Covid) Nasopharyngeal Swab   Specimen: Nasopharyngeal Swab; Nasopharyngeal(NP) swabs in vial transport medium  Result Value Ref Range   SARS Coronavirus 2 by RT PCR NEGATIVE NEGATIVE   Influenza A by PCR NEGATIVE NEGATIVE   Influenza B by PCR NEGATIVE NEGATIVE  CBC with Differential  Result Value Ref Range   WBC 11.3 (H) 4.0 - 10.5 K/uL   RBC 5.08 3.87 - 5.11 MIL/uL   Hemoglobin 14.5 12.0 - 15.0 g/dL   HCT 72.5 36.6 - 44.0 %   MCV 83.9 80.0 - 100.0 fL   MCH 28.5 26.0 - 34.0 pg   MCHC 34.0 30.0 - 36.0 g/dL   RDW 34.7 42.5 - 95.6 %   Platelets 245 150 - 400 K/uL   nRBC 0.0 0.0 - 0.2 %   Neutrophils Relative % 71 %   Neutro Abs 8.1 (H) 1.7 - 7.7 K/uL   Lymphocytes Relative 19 %   Lymphs Abs 2.1 0.7 - 4.0 K/uL   Monocytes Relative 7 %   Monocytes Absolute 0.7 0.1 - 1.0 K/uL   Eosinophils Relative 2 %   Eosinophils Absolute 0.2 0.0 - 0.5 K/uL   Basophils Relative 1 %   Basophils Absolute 0.1 0.0 - 0.1 K/uL   Immature Granulocytes 0 %   Abs Immature Granulocytes 0.05 0.00 - 0.07 K/uL  Comprehensive metabolic panel  Result Value Ref Range   Sodium 135 135 - 145 mmol/L   Potassium 3.3 (L) 3.5 - 5.1 mmol/L    Chloride 96 (L) 98 - 111 mmol/L   CO2 26 22 - 32 mmol/L   Glucose, Bld 304 (H) 70 - 99 mg/dL   BUN 11 6 - 20 mg/dL   Creatinine, Ser 3.87 0.44 - 1.00 mg/dL   Calcium 9.6 8.9 - 56.4 mg/dL   Total Protein 8.3 (H) 6.5 - 8.1 g/dL   Albumin 4.3 3.5 - 5.0 g/dL   AST 20 15 - 41 U/L   ALT 18 0 - 44 U/L   Alkaline Phosphatase 166 (H) 38 - 126 U/L   Total Bilirubin 1.0 0.3 - 1.2 mg/dL   GFR, Estimated >33 >29 mL/min   Anion gap 13 5 - 15  Lipase, blood  Result Value Ref Range   Lipase 35 11 - 51 U/L   CT ABDOMEN PELVIS W CONTRAST  Result Date: 03/18/2021 CLINICAL DATA:  Right lower quadrant abdominal pain, fever, nausea EXAM: CT ABDOMEN AND PELVIS WITH CONTRAST TECHNIQUE: Multidetector CT imaging of the abdomen and pelvis was performed using the standard protocol following bolus administration of intravenous contrast. CONTRAST:  OMNIPAQUE IOHEXOL 300 MG/ML  SOLN COMPARISON:  03/18/2020 FINDINGS: Lower  chest: The visualized lung bases are clear. The visualized heart and pericardium are unremarkable. Hepatobiliary: No focal liver abnormality is seen. No gallstones, gallbladder wall thickening, or biliary dilatation. Pancreas: Unremarkable Spleen: Unremarkable Adrenals/Urinary Tract: Adrenal glands are unremarkable. Kidneys are normal, without renal calculi, focal lesion, or hydronephrosis. Bladder is unremarkable. Stomach/Bowel: The appendix is dilated, fluid-filled, and hyperemic in keeping with changes of acute appendicitis. The appendix measures up to 12 mm in diameter. There is extensive periappendiceal inflammatory stranding. Hyperdense material is seen within the base of the appendix possibly representing inspissated intraluminal contents, however, a discrete calcified appendicolith is not clearly identified. No periappendiceal fluid collections or free intraperitoneal gas is identified. The stomach and small bowel are unremarkable. Mild descending and sigmoid colonic diverticulosis is noted. The  large bowel is otherwise unremarkable. Vascular/Lymphatic: No significant vascular findings are present. No enlarged abdominal or pelvic lymph nodes. Reproductive: Uterus and bilateral adnexa are unremarkable. Other: Tiny fat containing umbilical hernia.  Rectum unremarkable. Musculoskeletal: No acute bone abnormality. No lytic or blastic bone lesion. IMPRESSION: Acute, unruptured appendicitis Appendix: Location: Retrocecal Diameter: 12 mm Appendicolith: Not clearly identified, however, inspissated contents noted at the base of the appendix Mucosal hyper-enhancement: Present Extraluminal gas: Not present Periappendiceal collection: Not present Mild distal colonic diverticulosis. Electronically Signed   By: Helyn Numbers MD   On: 03/18/2021 06:39      Fayrene Helper, PA-C 03/18/21 9476    Vanetta Mulders, MD 03/18/21 [phone]

## 2021-03-18 NOTE — ED Triage Notes (Signed)
Pt reports RLQ abdominal pain starting tonight. Denies vomiting or diarrhea. Endorses chills and tactile fever. Endorses nausea.

## 2021-03-18 NOTE — ED Notes (Signed)
Patient is unable to urinate at this time 

## 2021-03-18 NOTE — Progress Notes (Addendum)
Inpatient Diabetes Program Recommendations  AACE/ADA: New Consensus Statement on Inpatient Glycemic Control (2015)  Target Ranges:  Prepandial:   less than 140 mg/dL      Peak postprandial:   less than 180 mg/dL (1-2 hours)      Critically ill patients:  140 - 180 mg/dL   Results for GIRTRUDE, ENSLIN (MRN [id number]) as of 03/18/2021 09:08  Ref. Range 03/18/2021 04:30  Glucose Latest Ref Range: 70 - 99 mg/dL 482 (H)    To ED with Abdominal Pain/ Acute Appendicitis  History: Diabetes  Home DM Meds: Basaglar 24-34 units QHS       Metformin 1000 mg BID    MD- If patient will be admitted, please consider the following Insulin while NPO:  Lantus 15 units Daily (70% total home dose for lower amount listed in Home Med Rec)  Novolog Sensitive Correction Scale/ SSI (0-9 units) Q4 hours     --Will follow patient during hospitalization--  Ambrose Finland RN, MSN, CDE Diabetes Coordinator Inpatient Glycemic Control Team Team Pager: [phone] (8a-5p)

## 2021-03-18 NOTE — Anesthesia Preprocedure Evaluation (Signed)
Anesthesia Evaluation  Patient identified by MRN, date of birth, ID band Patient awake    Reviewed: Allergy & Precautions, NPO status , Patient's Chart, lab work & pertinent test results, reviewed documented beta blocker date and time   History of Anesthesia Complications Negative for: history of anesthetic complications  Airway Mallampati: II  TM Distance: >3 FB Neck ROM: Full    Dental   Pulmonary neg pulmonary ROS, former smoker,    Pulmonary exam normal        Cardiovascular hypertension, Pt. on medications and Pt. on home beta blockers Normal cardiovascular exam+ dysrhythmias (s/p ablation) Supra Ventricular Tachycardia      Neuro/Psych negative neurological ROS     GI/Hepatic Neg liver ROS, GERD  ,Acute appendicitis   Endo/Other  diabetes, Type 2, Insulin Dependent, Oral Hypoglycemic AgentsHypothyroidism   Renal/GU negative Renal ROS  negative genitourinary   Musculoskeletal negative musculoskeletal ROS (+)   Abdominal   Peds  Hematology negative hematology ROS (+)   Anesthesia Other Findings   Reproductive/Obstetrics                            Anesthesia Physical Anesthesia Plan  ASA: III  Anesthesia Plan: General   Post-op Pain Management:    Induction: Intravenous, Rapid sequence and Cricoid pressure planned  PONV Risk Score and Plan: 4 or greater and Ondansetron, Dexamethasone, Treatment may vary due to age or medical condition and Midazolam  Airway Management Planned: Oral ETT  Additional Equipment: None  Intra-op Plan:   Post-operative Plan: Extubation in OR  Informed Consent: I have reviewed the patients History and Physical, chart, labs and discussed the procedure including the risks, benefits and alternatives for the proposed anesthesia with the patient or authorized representative who has indicated his/her understanding and acceptance.     Dental advisory  given  Plan Discussed with:   Anesthesia Plan Comments:         Anesthesia Quick Evaluation

## 2021-03-18 NOTE — Anesthesia Procedure Notes (Signed)
Procedure Name: Intubation Date/Time: 03/18/2021 10:31 AM Performed by: Elyn Peers, CRNA Pre-anesthesia Checklist: Patient identified, Emergency Drugs available, Suction available, Patient being monitored and Timeout performed Patient Re-evaluated:Patient Re-evaluated prior to induction Oxygen Delivery Method: Circle system utilized Preoxygenation: Pre-oxygenation with 100% oxygen Induction Type: IV induction, Rapid sequence and Cricoid Pressure applied Laryngoscope Size: Miller and 3 Grade View: Grade I Tube type: Oral Tube size: 7.0 mm Number of attempts: 1 Airway Equipment and Method: Stylet Placement Confirmation: ETT inserted through vocal cords under direct vision,  positive ETCO2 and breath sounds checked- equal and bilateral Secured at: 22 cm Tube secured with: Tape Dental Injury: Teeth and Oropharynx as per pre-operative assessment

## 2021-03-18 NOTE — H&P (Signed)
Julia [name] is an 50 y.o. female.   Chief Complaint: abd pain HPI: The patient is a 50 year old white female who presents with abdominal pain that started yesterday.  It was initially periumbilical but during the night moved to the right lower quadrant.  She has had low-grade fever at home.  The pain persisted so she came to the emergency department.  A CT scan did show evidence of appendicitis but no evidence of perforation.  Her pain has been associated with nausea and vomiting.  Past Medical History:  Diagnosis Date  . Complication of anesthesia    I had a reaction to a epidural "broke out"  . Condyloma acuminatum of vulva   . COVID-19   . GERD (gastroesophageal reflux disease)   . History of supraventricular tachycardia   . HSV-1 infection   . HSV-2 infection   . HTN (hypertension)   . Hyperlipidemia   . Type 2 diabetes mellitus (HCC)     Past Surgical History:  Procedure Laterality Date  . CARDIOVASCULAR STRESS TEST  06-04-2007   normal nuclear study/  no ischemia/  normal LVF, ef 63%  . CESAREAN SECTION  09-06-2005  . LASER ABLATION CONDOLAMATA N/A 10/08/2015   Procedure: CO2 LASER ABLATION CONDOLAMATA OF VULVA;  Surgeon: Richarda Overlieichard Holland, MD;  [facility];  Service: Gynecology;  Laterality: N/A;  . SUPRAVENTRICULAR TACHYCARDIA ABLATION N/A 02/24/2015   Procedure: SUPRAVENTRICULAR TACHYCARDIA ABLATION;  Surgeon: Marinus MawGregg W Taylor, MD;  [facility];  Service: Cardiovascular;  Laterality: N/A;  . TRANSTHORACIC ECHOCARDIOGRAM  01-06-2009   normal LVF, ef 55-60%,  trace MR and TR/  normal stress echo    Family History  Problem Relation Age of Onset  . Hypertension Mother   . Thyroid disease Mother   . Diabetes Mother   . Thyroid disease Sister   . Diabetes Maternal Grandmother   . Hyperlipidemia Other   . Thyroid disease Other    Social History:  reports that she quit smoking about 18 years ago. Her smoking use included cigarettes. She quit  after 8.00 years of use. She has never used smokeless tobacco. She reports current alcohol use of about 2.0 standard drinks of alcohol per week. She reports that she does not use drugs.  Allergies:  Allergies  Allergen Reactions  . Elemental Sulfur Other (See Comments)    blisters  . Quinolones Other (See Comments)    Cannot take!  . Sulfa Antibiotics Hives  . Trulicity [Dulaglutide]     Abdominal pain  . Hctz [Hydrochlorothiazide] Rash    Possible photosensitivity dermatitis  . Invokana [Canagliflozin] Rash    Possible photosensitivity dermatitis    (Not in a hospital admission)   Results for orders placed or performed during the hospital encounter of 03/18/21 (from the past 48 hour(s))  CBC with Differential     Status: Abnormal   Collection Time: 03/18/21  4:30 AM  Result Value Ref Range   WBC 11.3 (H) 4.0 - 10.5 K/uL   RBC 5.08 3.87 - 5.11 MIL/uL   Hemoglobin 14.5 12.0 - 15.0 g/dL   HCT 65.742.6 84.636.0 - 96.246.0 %   MCV 83.9 80.0 - 100.0 fL   MCH 28.5 26.0 - 34.0 pg   MCHC 34.0 30.0 - 36.0 g/dL   RDW 95.213.0 84.111.5 - 32.415.5 %   Platelets 245 150 - 400 K/uL   nRBC 0.0 0.0 - 0.2 %   Neutrophils Relative % 71 %   Neutro Abs 8.1 (H) 1.7 -  7.7 K/uL   Lymphocytes Relative 19 %   Lymphs Abs 2.1 0.7 - 4.0 K/uL   Monocytes Relative 7 %   Monocytes Absolute 0.7 0.1 - 1.0 K/uL   Eosinophils Relative 2 %   Eosinophils Absolute 0.2 0.0 - 0.5 K/uL   Basophils Relative 1 %   Basophils Absolute 0.1 0.0 - 0.1 K/uL   Immature Granulocytes 0 %   Abs Immature Granulocytes 0.05 0.00 - 0.07 K/uL    Comment: Performed at [facility], [address] [place], [place] [address]  Comprehensive metabolic panel     Status: Abnormal   Collection Time: 03/18/21  4:30 AM  Result Value Ref Range   Sodium 135 135 - 145 mmol/L   Potassium 3.3 (L) 3.5 - 5.1 mmol/L   Chloride 96 (L) 98 - 111 mmol/L   CO2 26 22 - 32 mmol/L   Glucose, Bld 304 (H) 70 - 99 mg/dL    Comment: Glucose reference  range applies only to samples taken after fasting for at least 8 hours.   BUN 11 6 - 20 mg/dL   Creatinine, Ser 0.62 0.44 - 1.00 mg/dL   Calcium 9.6 8.9 - 69.4 mg/dL   Total Protein 8.3 (H) 6.5 - 8.1 g/dL   Albumin 4.3 3.5 - 5.0 g/dL   AST 20 15 - 41 U/L   ALT 18 0 - 44 U/L   Alkaline Phosphatase 166 (H) 38 - 126 U/L   Total Bilirubin 1.0 0.3 - 1.2 mg/dL   GFR, Estimated >85 >46 mL/min    Comment: (NOTE) Calculated using the CKD-EPI Creatinine Equation (2021)    Anion gap 13 5 - 15    Comment: Performed at [facility], [address], [place], [place] [address]  Lipase, blood     Status: None   Collection Time: 03/18/21  4:30 AM  Result Value Ref Range   Lipase 35 11 - 51 U/L    Comment: Performed at [facility], [address] [place], [place] [address]  Resp Panel by RT-PCR (Flu A&B, Covid) Nasopharyngeal Swab     Status: None   Collection Time: 03/18/21  7:09 AM   Specimen: Nasopharyngeal Swab; Nasopharyngeal(NP) swabs in vial transport medium  Result Value Ref Range   SARS Coronavirus 2 by RT PCR NEGATIVE NEGATIVE    Comment: (NOTE) SARS-CoV-2 target nucleic acids are NOT DETECTED.  The SARS-CoV-2 RNA is generally detectable in upper respiratory specimens during the acute phase of infection. The lowest concentration of SARS-CoV-2 viral copies this assay can detect is 138 copies/mL. A negative result does not preclude SARS-Cov-2 infection and should not be used as the sole basis for treatment or other patient management decisions. A negative result may occur with  improper specimen collection/handling, submission of specimen other than nasopharyngeal swab, presence of viral mutation(s) within the areas targeted by this assay, and inadequate number of viral copies(<138 copies/mL). A negative result must be combined with clinical observations, patient history, and epidemiological information. The expected result is Negative.  Fact  Sheet for Patients:  BloggerCourse.com  Fact Sheet for Healthcare Providers:  SeriousBroker.it  This test is no t yet approved or cleared by the [place] FDA and  has been authorized for detection and/or diagnosis of SARS-CoV-2 by FDA under an Emergency Use Authorization (EUA). This EUA will remain  in effect (meaning this test can be used) for the duration of the COVID-19 declaration under Section 564(b)(1) of the Act, 21  U.S.C.section 360bbb-3(b)(1), unless the authorization is terminated  or revoked sooner.       Influenza A by PCR NEGATIVE NEGATIVE   Influenza B by PCR NEGATIVE NEGATIVE    Comment: (NOTE) The Xpert Xpress SARS-CoV-2/FLU/RSV plus assay is intended as an aid in the diagnosis of influenza from Nasopharyngeal swab specimens and should not be used as a sole basis for treatment. Nasal washings and aspirates are unacceptable for Xpert Xpress SARS-CoV-2/FLU/RSV testing.  Fact Sheet for Patients: BloggerCourse.com  Fact Sheet for Healthcare Providers: SeriousBroker.it  This test is not yet approved or cleared by the [place] FDA and has been authorized for detection and/or diagnosis of SARS-CoV-2 by FDA under an Emergency Use Authorization (EUA). This EUA will remain in effect (meaning this test can be used) for the duration of the COVID-19 declaration under Section 564(b)(1) of the Act, 21 U.S.C. section 360bbb-3(b)(1), unless the authorization is terminated or revoked.  Performed at [facility], [address] [place], [place] [address]    CT ABDOMEN PELVIS W CONTRAST  Result Date: 03/18/2021 CLINICAL DATA:  Right lower quadrant abdominal pain, fever, nausea EXAM: CT ABDOMEN AND PELVIS WITH CONTRAST TECHNIQUE: Multidetector CT imaging of the abdomen and pelvis was performed using the standard protocol following bolus administration  of intravenous contrast. CONTRAST:  OMNIPAQUE IOHEXOL 300 MG/ML  SOLN COMPARISON:  03/18/2020 FINDINGS: Lower chest: The visualized lung bases are clear. The visualized heart and pericardium are unremarkable. Hepatobiliary: No focal liver abnormality is seen. No gallstones, gallbladder wall thickening, or biliary dilatation. Pancreas: Unremarkable Spleen: Unremarkable Adrenals/Urinary Tract: Adrenal glands are unremarkable. Kidneys are normal, without renal calculi, focal lesion, or hydronephrosis. Bladder is unremarkable. Stomach/Bowel: The appendix is dilated, fluid-filled, and hyperemic in keeping with changes of acute appendicitis. The appendix measures up to 12 mm in diameter. There is extensive periappendiceal inflammatory stranding. Hyperdense material is seen within the base of the appendix possibly representing inspissated intraluminal contents, however, a discrete calcified appendicolith is not clearly identified. No periappendiceal fluid collections or free intraperitoneal gas is identified. The stomach and small bowel are unremarkable. Mild descending and sigmoid colonic diverticulosis is noted. The large bowel is otherwise unremarkable. Vascular/Lymphatic: No significant vascular findings are present. No enlarged abdominal or pelvic lymph nodes. Reproductive: Uterus and bilateral adnexa are unremarkable. Other: Tiny fat containing umbilical hernia.  Rectum unremarkable. Musculoskeletal: No acute bone abnormality. No lytic or blastic bone lesion. IMPRESSION: Acute, unruptured appendicitis Appendix: Location: Retrocecal Diameter: 12 mm Appendicolith: Not clearly identified, however, inspissated contents noted at the base of the appendix Mucosal hyper-enhancement: Present Extraluminal gas: Not present Periappendiceal collection: Not present Mild distal colonic diverticulosis. Electronically Signed   By: Helyn Numbers MD   On: 03/18/2021 06:39    Review of Systems  Constitutional: Positive for  fever.  HENT: Negative.   Eyes: Negative.   Respiratory: Negative.   Cardiovascular: Negative.   Gastrointestinal: Positive for abdominal pain, nausea and vomiting.  Endocrine: Negative.   Genitourinary: Negative.   Musculoskeletal: Negative.   Skin: Negative.   Allergic/Immunologic: Negative.   Neurological: Negative.   Hematological: Negative.   Psychiatric/Behavioral: Negative.     Blood pressure 121/78, pulse (!) 105, temperature 97.9 F (36.6 C), temperature source Oral, resp. rate 18, last menstrual period 07/22/2019, SpO2 97 %. Physical Exam Constitutional:      General: She is not in acute distress.    Appearance: Normal appearance. She is obese.  HENT:     Head: Normocephalic and atraumatic.  Right Ear: External ear normal.     Left Ear: External ear normal.     Nose: Nose normal.     Mouth/Throat:     Mouth: Mucous membranes are dry.     Pharynx: Oropharynx is clear.  Eyes:     General: No scleral icterus.    Extraocular Movements: Extraocular movements intact.     Conjunctiva/sclera: Conjunctivae normal.     Pupils: Pupils are equal, round, and reactive to light.  Cardiovascular:     Rate and Rhythm: Normal rate and regular rhythm.     Pulses: Normal pulses.     Heart sounds: Normal heart sounds.     Comments: No pitting edema lower extr Pulmonary:     Effort: Pulmonary effort is normal. No respiratory distress.     Breath sounds: Normal breath sounds. No stridor.  Abdominal:     General: There is no distension.     Palpations: Abdomen is soft. There is no mass.     Comments: There is moderate focal RLQ tenderness  Musculoskeletal:        General: No swelling or deformity. Normal range of motion.     Cervical back: Normal range of motion and neck supple. No tenderness.  Skin:    General: Skin is warm and dry.     Coloration: Skin is not jaundiced.  Neurological:     General: No focal deficit present.     Mental Status: She is alert and oriented to  person, place, and time.  Psychiatric:        Mood and Affect: Mood normal.        Behavior: Behavior normal.        Thought Content: Thought content normal.      Assessment/Plan The patient appears to have acute appendicitis.  Because of the risk of perforation and sepsis I think she would benefit from having her appendix removed.  She would also like to have this done.  I have discussed with her in detail the risks and benefits of the operation to remove the appendix as well as some of the technical aspects including the risk of leak from the staple line and she understands and wishes to proceed.  We will start her on broad-spectrum antibiotic therapy and plan for surgery this morning  Chevis Pretty III, MD 03/18/2021, 9:45 AM

## 2021-03-18 NOTE — Anesthesia Postprocedure Evaluation (Signed)
Anesthesia Post Note  Patient: Julia [name]  Procedure(s) Performed: APPENDECTOMY LAPAROSCOPIC (N/A )     Patient location during evaluation: PACU Anesthesia Type: General Level of consciousness: awake and alert Pain management: pain level controlled Vital Signs Assessment: post-procedure vital signs reviewed and stable Respiratory status: spontaneous breathing, nonlabored ventilation and respiratory function stable Cardiovascular status: blood pressure returned to baseline and stable Postop Assessment: no apparent nausea or vomiting Anesthetic complications: no   No complications documented.  Last Vitals:  Vitals:   03/18/21 1145 03/18/21 1200  BP: 131/78 130/81  Pulse: (!) 102 (!) 104  Resp: 20 19  Temp:    SpO2: 100% 96%    Last Pain:  Vitals:   03/18/21 1200  TempSrc:   PainSc: 4                  [name]

## 2021-03-18 NOTE — Op Note (Signed)
03/18/2021  11:20 AM  PATIENT:  Julia [name]  50 y.o. female  PRE-OPERATIVE DIAGNOSIS:  ACUTE APPENDICITIS  POST-OPERATIVE DIAGNOSIS:  ACUTE APPENDICITIS  PROCEDURE:  Procedure(s): APPENDECTOMY LAPAROSCOPIC (N/A)  SURGEON:  Surgeon(s) and Role:    * Griselda Miner, MD - Primary  PHYSICIAN ASSISTANT:   ASSISTANTS: none   ANESTHESIA:   local and general  EBL:  10 mL   BLOOD ADMINISTERED:none  DRAINS: none   LOCAL MEDICATIONS USED:  MARCAINE     SPECIMEN:  Source of Specimen:  appendix  DISPOSITION OF SPECIMEN:  PATHOLOGY  COUNTS:  YES  TOURNIQUET:  * No tourniquets in log *  DICTATION: .Dragon Dictation   After informed consent was obtained patient was brought to the operating room placed in the supine position on the operating room table. After adequate induction of general anesthesia the patient's abdomen was prepped with ChloraPrep, allowed to dry, and draped in usual sterile manner. The area below the umbilicus was infiltrated with quarter percent Marcaine. A small incision was made with a 15 blade knife. This incision was carried down through the subcutaneous tissue bluntly with a hemostat and Army-Navy retractors until the linea alba was identified. The linea alba was incised with a 15 blade knife. Each side was grasped Coker clamps and elevated anteriorly. The preperitoneal space was probed bluntly with a hemostat until the peritoneum was opened and access was gained to the abdominal cavity. A 0 Vicryl purse string stitch was placed in the fascia surrounding the opening. A Hassan cannula was placed through the opening and anchored in place with the previously placed Vicryl purse string stitch. The laparoscope was placed through the [facility] cannula. The abdomen was insufflated with carbon dioxide without difficulty. Next the suprapubic area was infiltrated with quarter percent Marcaine. A small incision was made with a 15 blade knife. A 5 mm port was placed bluntly through  this incision into the abdominal cavity. A site was then chosen between the 2 port for placement of a 5 mm port. The area was infiltrated with quarter percent Marcaine. A small stab incision was made with a 15 blade knife. A 5 mm port was placed bluntly through this incision and the abdominal cavity under direct vision. The laparoscope was then moved to the suprapubic port. Using a Glassman grasper and harmonic scalpel the right lower quadrant was inspected. The appendix was readily identified. The appendix was elevated anteriorly and the mesoappendix was taken down sharply with the harmonic scalpel. Once the base of the appendix where it joined the cecum was identified and cleared of any tissue then a laparoscopic GIA blue load 6 row stapler was placed through the [facility] [place] cannula. The stapler was placed across the base of the appendix clamped and fired thereby dividing the base of the appendix between staple lines. A laparoscopic bag was then inserted through the [facility] cannula. The appendix was placed within the bag and the bag was sealed. The abdomen was then irrigated with copious amounts of saline until the effluent was clear. No other abnormalities were noted. There was a small amount of bleeding from the staple line that was controlled with clips. The appendix and bag were removed with the [facility] cannula through the infraumbilical port without difficulty. The fascial defect was closed with the previously placed Vicryl pursestring stitch as well as with another interrupted 0 Vicryl figure-of-eight stitch. The rest of the ports were removed under direct vision and were found to be hemostatic. The gas was allowed  to escape. The skin incisions were closed with interrupted 4-0 Monocryl subcuticular stitches. Dermabond dressings were applied. The patient tolerated the procedure well. At the end of the case all needle sponge and instrument counts were correct. The patient was then awakened and taken to recovery in  stable condition.  PLAN OF CARE: Admit for overnight observation  PATIENT DISPOSITION:  PACU - hemodynamically stable.   Delay start of Pharmacological VTE agent (>24hrs) due to surgical blood loss or risk of bleeding: no

## 2021-03-19 ENCOUNTER — Encounter (HOSPITAL_COMMUNITY): Payer: Self-pay | Admitting: General Surgery

## 2021-03-19 LAB — URINALYSIS, ROUTINE W REFLEX MICROSCOPIC
Bacteria, UA: NONE SEEN
Bilirubin Urine: NEGATIVE
Glucose, UA: >=500 mg/dL — AB
Hgb urine dipstick: NEGATIVE
Ketones, ur: 5 mg/dL — AB
Leukocytes,Ua: NEGATIVE
Nitrite: NEGATIVE
Protein, ur: 30 mg/dL — AB
Specific Gravity, Urine: 1.027 (ref 1.005–1.030)
pH: 6 (ref 5.0–8.0)

## 2021-03-19 LAB — HEMOGLOBIN A1C
Hgb A1c MFr Bld: 12.4 % — ABNORMAL HIGH (ref 4.8–5.6)
Mean Plasma Glucose: 309 mg/dL

## 2021-03-19 LAB — GLUCOSE, CAPILLARY: Glucose-Capillary: 329 mg/dL — ABNORMAL HIGH (ref 70–99)

## 2021-03-19 MED ORDER — HYDROCODONE-ACETAMINOPHEN 5-325 MG PO TABS: 1.0000 | ORAL_TABLET | Freq: Four times a day (QID) | ORAL | 0 refills | Status: DC | PRN

## 2021-03-19 NOTE — Progress Notes (Signed)
1 Day Post-Op   Subjective/Chief Complaint: Complains of soreness but otherwise feels better   Objective: Vital signs in last 24 hours: Temp:  [97.5 F (36.4 C)-100.6 F (38.1 C)] 98.2 F (36.8 C) (04/16 0540) Pulse Rate:  [90-110] 90 (04/16 0540) Resp:  [14-25] 18 (04/16 0540) BP: (90-141)/(53-90) 116/80 (04/16 0540) SpO2:  [92 %-100 %] 96 % (04/16 0540) Weight:  [81.6 kg] 81.6 kg (04/15 1345) Last BM Date: 03/17/21  Intake/Output from previous day: 04/15 0701 - 04/16 0700 In: 3336 [P.O.:1200; I.V.:2036; IV Piggyback:100] Out: 1695 [Urine:1685; Blood:10] Intake/Output this shift: No intake/output data recorded.  General appearance: alert and cooperative Resp: clear to auscultation bilaterally Cardio: regular rate and rhythm GI: soft, minimal tenderness  Lab Results:  Recent Labs    03/18/21 0430  WBC 11.3*  HGB 14.5  HCT 42.6  PLT 245   BMET Recent Labs    03/18/21 0430  NA 135  K 3.3*  CL 96*  CO2 26  GLUCOSE 304*  BUN 11  CREATININE 0.77  CALCIUM 9.6   PT/INR No results for input(s): LABPROT, INR in the last 72 hours. ABG No results for input(s): PHART, HCO3 in the last 72 hours.  Invalid input(s): PCO2, PO2  Studies/Results: CT ABDOMEN PELVIS W CONTRAST  Result Date: 03/18/2021 CLINICAL DATA:  Right lower quadrant abdominal pain, fever, nausea EXAM: CT ABDOMEN AND PELVIS WITH CONTRAST TECHNIQUE: Multidetector CT imaging of the abdomen and pelvis was performed using the standard protocol following bolus administration of intravenous contrast. CONTRAST:  OMNIPAQUE IOHEXOL 300 MG/ML  SOLN COMPARISON:  03/18/2020 FINDINGS: Lower chest: The visualized lung bases are clear. The visualized heart and pericardium are unremarkable. Hepatobiliary: No focal liver abnormality is seen. No gallstones, gallbladder wall thickening, or biliary dilatation. Pancreas: Unremarkable Spleen: Unremarkable Adrenals/Urinary Tract: Adrenal glands are unremarkable. Kidneys  are normal, without renal calculi, focal lesion, or hydronephrosis. Bladder is unremarkable. Stomach/Bowel: The appendix is dilated, fluid-filled, and hyperemic in keeping with changes of acute appendicitis. The appendix measures up to 12 mm in diameter. There is extensive periappendiceal inflammatory stranding. Hyperdense material is seen within the base of the appendix possibly representing inspissated intraluminal contents, however, a discrete calcified appendicolith is not clearly identified. No periappendiceal fluid collections or free intraperitoneal gas is identified. The stomach and small bowel are unremarkable. Mild descending and sigmoid colonic diverticulosis is noted. The large bowel is otherwise unremarkable. Vascular/Lymphatic: No significant vascular findings are present. No enlarged abdominal or pelvic lymph nodes. Reproductive: Uterus and bilateral adnexa are unremarkable. Other: Tiny fat containing umbilical hernia.  Rectum unremarkable. Musculoskeletal: No acute bone abnormality. No lytic or blastic bone lesion. IMPRESSION: Acute, unruptured appendicitis Appendix: Location: Retrocecal Diameter: 12 mm Appendicolith: Not clearly identified, however, inspissated contents noted at the base of the appendix Mucosal hyper-enhancement: Present Extraluminal gas: Not present Periappendiceal collection: Not present Mild distal colonic diverticulosis. Electronically Signed   By: Helyn Numbers MD   On: 03/18/2021 06:39    Anti-infectives: Anti-infectives (From admission, onward)   Start     Dose/Rate Route Frequency Ordered Stop   03/19/21 1000  trimethoprim (TRIMPEX) tablet 100 mg        100 mg Oral Daily 03/18/21 1342     03/18/21 0645  cefTRIAXone (ROCEPHIN) 2 g in sodium chloride 0.9 % 100 mL IVPB       "And" Linked Group Details   2 g 200 mL/hr over 30 Minutes Intravenous  Once 03/18/21 0637 03/18/21 0857   03/18/21 0645  metroNIDAZOLE (FLAGYL) IVPB 500 mg       "And" Linked Group Details    500 mg 100 mL/hr over 60 Minutes Intravenous  Once 03/18/21 6761 03/18/21 0751      Assessment/Plan: s/p Procedure(s): APPENDECTOMY LAPAROSCOPIC (N/A) Advance diet Discharge  Check u/a  LOS: 0 days    Chevis Pretty III 03/19/2021

## 2021-03-19 NOTE — Discharge Summary (Signed)
Physician Discharge Summary  Patient ID: Julia [name] MRN: [id number] DOB/AGE: 01/01/1971 50 y.o.  Admit date: 03/18/2021 Discharge date: 03/19/2021  Admission Diagnoses:  Discharge Diagnoses:  Active Problems:   Appendicitis   Discharged Condition: good  Hospital Course: the pt underwent lap appy. On pod 1 she was ready for d/c home  Consults: None  Significant Diagnostic Studies: none  Treatments: surgery: as above  Discharge Exam: Blood pressure 116/80, pulse 90, temperature 98.2 F (36.8 C), temperature source Oral, resp. rate 18, height 5\' 4"  (1.626 m), weight 81.6 kg, last menstrual period 07/22/2019, SpO2 96 %. GI: soft, minimal tenderness  Disposition:    Allergies as of 03/19/2021      Reactions   Elemental Sulfur Other (See Comments)   blisters   Quinolones Other (See Comments)   Cannot take!   Sulfa Antibiotics Hives   Trulicity [dulaglutide]    Abdominal pain   Hctz [hydrochlorothiazide] Rash   Possible photosensitivity dermatitis   Invokana [canagliflozin] Rash   Possible photosensitivity dermatitis      Medication List    TAKE these medications   Basaglar KwikPen 100 UNIT/ML Inject 24-34 Units into the skin at bedtime.   ferrous sulfate 324 MG Tbec Take 324 mg by mouth daily with breakfast.   furosemide 40 MG tablet Commonly known as: LASIX Take 20-40 mg by mouth daily as needed for fluid.   glucose blood test strip Commonly known as: ONE TOUCH ULTRA TEST USE AS DIRECTED   HYDROcodone-acetaminophen 5-325 MG tablet Commonly known as: NORCO/VICODIN Take 1-2 tablets by mouth every 6 (six) hours as needed for moderate pain.   ipratropium 0.06 % nasal spray Commonly known as: ATROVENT Place 2 sprays into both nostrils daily as needed for congestion.   levothyroxine 88 MCG tablet Commonly known as: SYNTHROID Take 88 mcg by mouth every morning.   loratadine 10 MG tablet Commonly known as: CLARITIN Take 10 mg by mouth daily as needed for  allergies.   metFORMIN 1000 MG tablet Commonly known as: GLUCOPHAGE Take 1,000 mg by mouth 2 (two) times daily with a meal.   metoprolol succinate 50 MG 24 hr tablet Commonly known as: TOPROL-XL Take 50 mg by mouth in the morning and at bedtime.   pantoprazole 40 MG tablet Commonly known as: PROTONIX Take 40 mg by mouth 2 (two) times daily.   propranolol 10 MG tablet Commonly known as: INDERAL Take 10 mg by mouth 4 (four) times daily as needed (heart papatations).   trimethoprim 100 MG tablet Commonly known as: TRIMPEX Take 100 mg by mouth daily.   vitamin B-12 1000 MCG tablet Commonly known as: CYANOCOBALAMIN Take 1,000 mcg by mouth daily.       Follow-up Information    03/21/2021 III, MD Follow up in 3 week(s).   Specialty: General Surgery Contact information: [address] [place] [id number]               Signed: [phone] III 03/19/2021, 7:37 AM

## 2021-03-19 NOTE — Progress Notes (Signed)
Patient with increase blood glucose level without ordered sliding scale coverage for bedtime. See flow sheet for details. P. Carolynne Edouard MD notified. No new orders at this time. Will continue to monitor patient and await new orders.

## 2021-03-19 NOTE — Progress Notes (Signed)
Assessment unchanged. Verbalized understanding of dc instructions through teach back. Understands when to call the doctor. Discharged via wc to front entrance accompanied by NT and husband.

## 2021-03-21 LAB — SURGICAL PATHOLOGY

## 2021-03-23 NOTE — Addendum Note (Signed)
Addendum  created 03/23/21 0611 by Elyn Peers, CRNA   Intraprocedure Event edited

## 2021-03-30 ENCOUNTER — Other Ambulatory Visit: Payer: Self-pay | Admitting: Sports Medicine

## 2021-03-30 DIAGNOSIS — M25571 Pain in right ankle and joints of right foot: Secondary | ICD-10-CM

## 2021-04-03 ENCOUNTER — Other Ambulatory Visit: Payer: Self-pay

## 2021-04-03 ENCOUNTER — Ambulatory Visit
Admission: RE | Admit: 2021-04-03 | Discharge: 2021-04-03 | Disposition: A | Discharge status: Home / Self Care | Payer: 59 | Source: Ambulatory Visit | Attending: Sports Medicine | Admitting: Sports Medicine

## 2021-04-03 DIAGNOSIS — M25571 Pain in right ankle and joints of right foot: Secondary | ICD-10-CM

## 2021-04-19 ENCOUNTER — Ambulatory Visit: Payer: 59 | Attending: Sports Medicine | Admitting: Physical Therapy

## 2021-04-19 ENCOUNTER — Encounter: Payer: Self-pay | Admitting: Physical Therapy

## 2021-04-19 ENCOUNTER — Other Ambulatory Visit: Payer: Self-pay

## 2021-04-19 DIAGNOSIS — M25671 Stiffness of right ankle, not elsewhere classified: Secondary | ICD-10-CM | POA: Diagnosis present

## 2021-04-19 DIAGNOSIS — M25571 Pain in right ankle and joints of right foot: Secondary | ICD-10-CM | POA: Diagnosis present

## 2021-04-19 NOTE — Therapy (Signed)
[facility] [address] [place], [place], [address] Phone: [phone]   Fax:  [phone]  Physical Therapy Evaluation  Patient Details  Name: Julia [name] MRN: [id number] Date of Birth: 1970/12/09 Referring Provider (PT): Rodolph Bong MD   Encounter Date: 04/19/2021   PT End of Session - 04/19/21 1041    Visit Number 1    Number of Visits 12    Date for PT Re-Evaluation 05/17/21    PT Start Time 0733    PT Stop Time 0819    PT Time Calculation (min) 46 min    Activity Tolerance Patient tolerated treatment well    Behavior During Therapy [facility] for tasks assessed/performed           Past Medical History:  Diagnosis Date  . Complication of anesthesia    I had a reaction to a epidural "broke out"  . Condyloma acuminatum of vulva   . COVID-19   . GERD (gastroesophageal reflux disease)   . History of supraventricular tachycardia   . HSV-1 infection   . HSV-2 infection   . HTN (hypertension)   . Hyperlipidemia   . Type 2 diabetes mellitus (HCC)     Past Surgical History:  Procedure Laterality Date  . CARDIOVASCULAR STRESS TEST  06-04-2007   normal nuclear study/  no ischemia/  normal LVF, ef 63%  . CESAREAN SECTION  09-06-2005  . LAPAROSCOPIC APPENDECTOMY N/A 03/18/2021   Procedure: APPENDECTOMY LAPAROSCOPIC;  Surgeon: Griselda Miner, MD;  Location: [facility];  Service: General;  Laterality: N/A;  . LASER ABLATION CONDOLAMATA N/A 10/08/2015   Procedure: CO2 LASER ABLATION CONDOLAMATA OF VULVA;  Surgeon: Richarda Overlie, MD;  Location: [facility] [place];  Service: Gynecology;  Laterality: N/A;  . SUPRAVENTRICULAR TACHYCARDIA ABLATION N/A 02/24/2015   Procedure: SUPRAVENTRICULAR TACHYCARDIA ABLATION;  Surgeon: Marinus Maw, MD;  Location: [facility] CATH LAB;  Service: Cardiovascular;  Laterality: N/A;  . TRANSTHORACIC ECHOCARDIOGRAM  01-06-2009   normal LVF, ef 55-60%,  trace MR and TR/  normal stress echo    There were no  vitals filed for this visit.    Subjective Assessment - 04/19/21 1020    Subjective COVID-19 screen performed prior to patient entering clinic.  The patient returns to the clinic today and is now out of her boot.  She reports since she has been out of the boot she felt a pop in her Achilles tendon.  She states an MRI showed a partial tear of the tendon and a bone spur.  He pain-level today is a 5/10 and higher with increased up time.    Pertinent History SVT, DM, h/o right heel pain.    Patient Stated Goals Get back to normal.    Currently in Pain? Yes    Pain Score 5     Pain Location Ankle    Pain Orientation Right    Pain Descriptors / Indicators Aching;Sharp                          Objective measurements completed on examination: See above findings.       [facility] Adult PT Treatment/Exercise - 04/19/21 0001      Modalities   Modalities Electrical Stimulation;Moist Surveyor, mining Location RT Achilles    Electrical Stimulation Action Pre-mod.    Electrical Stimulation Parameters 80-150 Hz x 20 minutes.    Electrical Stimulation Goals Pain  PT Long Term Goals - 04/19/21 1042      PT LONG TERM GOAL #1   Title Independent with a HEP.    Period Weeks    Status New      PT LONG TERM GOAL #3   Title Walk a community distance without CAM walker/boot with pain not > 3/10.    Time 4    Period Weeks      PT LONG TERM GOAL #4   Title Perform a reciprocating stair gait with one railing.    Time 4    Period Weeks    Status New      PT LONG TERM GOAL #5   Title Perform ADL's with right ankle pain not > 2/10.    Time 4    Status New                  Plan - 04/19/21 1037    Clinical Impression Statement The patient has been doing her HEP.  Her right ankle range of motion is now full which includes dorsiflexion with knee extended and knee flexed.  She is most palpably tender  over the body of her right Achilles tendon.  Her gait is slow and cautious.    Personal Factors and Comorbidities Comorbidity 1;Other    Comorbidities SVT, DM, h/o right heel pain.    Examination-Activity Limitations Other;Locomotion Level    Examination-Participation Restrictions Other    Stability/Clinical Decision Making Stable/Uncomplicated    Rehab Potential Excellent    PT Frequency 3x / week    PT Duration 4 weeks    PT Treatment/Interventions ADLs/Self Care Home Management;Cryotherapy;Electrical Stimulation;Ultrasound;Moist Heat;Iontophoresis 4mg /ml Dexamethasone;Gait training;Stair training;Functional mobility training;Therapeutic activities;Therapeutic exercise;Neuromuscular re-education;Manual techniques;Patient/family education;Passive range of motion;Vasopneumatic Device    PT Next Visit Plan Gentle STW/M to patient's right calf/Achilles, seated Rockerboard/BAPS board, low-level combo e'stim/US, low-level e'stim/Vaso.    Consulted and Agree with Plan of Care Patient           Patient will benefit from skilled therapeutic intervention in order to improve the following deficits and impairments:  Abnormal gait,Pain,Decreased activity tolerance,Decreased range of motion,Increased edema  Visit Diagnosis: Pain in right ankle and joints of right foot - Plan: PT plan of care cert/re-cert  Stiffness of right ankle, not elsewhere classified - Plan: PT plan of care cert/re-cert     Problem List Patient Active Problem List   Diagnosis Date Noted  . Appendicitis 03/18/2021  . E coli bacteremia 03/22/2020  . Bacteremia due to Gram-negative bacteria 03/20/2020  . Hypothyroidism   . Pyelonephritis 02/28/2020  . Intractable nausea and vomiting 02/28/2020  . Nasal sinus congestion 03/15/2018  . Subclinical hypothyroidism 08/05/2017  . Hyperlipidemia 07/28/2016  . Type 2 diabetes mellitus with hyperglycemia, without long-term current use of insulin (HCC) 04/15/2016  . Sinus  tachycardia 11/12/2015  . S/P radiofrequency ablation operation for arrhythmia 02/24/2015  . SVT (supraventricular tachycardia) (HCC) 08/13/2014  . Palpitations 08/13/2014  . Obesity, Class I, BMI 30-34.9 09/30/2013  . HTN (hypertension) 08/01/2012    Dahir Ayer, 08/03/2012  MPT 04/19/2021, 10:45 AM  [facility] [address] [place], [place], [place] Phone: [phone]   Fax:  [phone]  Name: MALISSA SLAY MRN: [name] Date of Birth: 02-12-1971

## 2021-04-22 ENCOUNTER — Ambulatory Visit: Payer: 59 | Admitting: Physical Therapy

## 2021-04-22 ENCOUNTER — Other Ambulatory Visit: Payer: Self-pay

## 2021-04-22 ENCOUNTER — Encounter: Payer: Self-pay | Admitting: Physical Therapy

## 2021-04-22 DIAGNOSIS — M25671 Stiffness of right ankle, not elsewhere classified: Secondary | ICD-10-CM

## 2021-04-22 DIAGNOSIS — M25571 Pain in right ankle and joints of right foot: Secondary | ICD-10-CM | POA: Diagnosis not present

## 2021-04-22 NOTE — Therapy (Signed)
[facility] [address] [place], [place], [address] Phone: [phone]   Fax:  [phone]  Physical Therapy Treatment  Patient Details  Name: Julia [name] MRN: [id number] Date of Birth: 1971/01/04 Referring Provider (PT): Rodolph Bong MD   Encounter Date: 04/22/2021   PT End of Session - 04/22/21 0827    Visit Number 2    Number of Visits 12    Date for PT Re-Evaluation 05/17/21    Authorization Type FOTO.    PT Start Time 0732    PT Stop Time 0816    PT Time Calculation (min) 44 min    Activity Tolerance Patient tolerated treatment well    Behavior During Therapy [facility] for tasks assessed/performed           Past Medical History:  Diagnosis Date  . Complication of anesthesia    I had a reaction to a epidural "broke out"  . Condyloma acuminatum of vulva   . COVID-19   . GERD (gastroesophageal reflux disease)   . History of supraventricular tachycardia   . HSV-1 infection   . HSV-2 infection   . HTN (hypertension)   . Hyperlipidemia   . Type 2 diabetes mellitus (HCC)     Past Surgical History:  Procedure Laterality Date  . CARDIOVASCULAR STRESS TEST  06-04-2007   normal nuclear study/  no ischemia/  normal LVF, ef 63%  . CESAREAN SECTION  09-06-2005  . LAPAROSCOPIC APPENDECTOMY N/A 03/18/2021   Procedure: APPENDECTOMY LAPAROSCOPIC;  Surgeon: Griselda Miner, MD;  Location: [facility];  Service: General;  Laterality: N/A;  . LASER ABLATION CONDOLAMATA N/A 10/08/2015   Procedure: CO2 LASER ABLATION CONDOLAMATA OF VULVA;  Surgeon: Richarda Overlie, MD;  Location: [facility];  Service: Gynecology;  Laterality: N/A;  . SUPRAVENTRICULAR TACHYCARDIA ABLATION N/A 02/24/2015   Procedure: SUPRAVENTRICULAR TACHYCARDIA ABLATION;  Surgeon: Marinus Maw, MD;  Location: [facility] CATH LAB;  Service: Cardiovascular;  Laterality: N/A;  . TRANSTHORACIC ECHOCARDIOGRAM  01-06-2009   normal LVF, ef 55-60%,  trace MR and TR/  normal stress  echo    There were no vitals filed for this visit.   Subjective Assessment - 04/22/21 0817    Subjective COVID 19 screening performed on patient upon arrival. Patient reports soreness superior to posterior calcaneus.    Pertinent History SVT, DM, h/o right heel pain.    Patient Stated Goals Get back to normal.    Currently in Pain? Other (Comment)   No pain assessment provided             [facility] PT Assessment - 04/22/21 0001      Assessment   Medical Diagnosis Right foot pain    Referring Provider (PT) Rodolph Bong MD    Onset Date/Surgical Date 01/18/21                         [facility] Adult PT Treatment/Exercise - 04/22/21 0001      Exercises   Exercises Ankle      Modalities   Modalities Electrical Stimulation;Vasopneumatic      Electrical Stimulation   Electrical Stimulation Location R achilles/ PF    Electrical Stimulation Action Pre-mod    Electrical Stimulation Parameters 80-150 hz x10 min    Electrical Stimulation Goals Pain      Vasopneumatic   Number Minutes Vasopneumatic  10 minutes    Vasopnuematic Location  Ankle    Vasopneumatic Pressure Low    Vasopneumatic  Temperature  34/pain      Manual Therapy   Manual Therapy Soft tissue mobilization    Soft tissue mobilization STW to R achilles, gastroc to reduce tone      Ankle Exercises: Standing   Rocker Board 5 minutes    Other Standing Ankle Exercises Prostretch x3 min      Ankle Exercises: Seated   Heel Raises Right;20 reps    Toe Raise 20 reps    Other Seated Ankle Exercises Ball roll on PF x3 min                       PT Long Term Goals - 04/19/21 1042      PT LONG TERM GOAL #1   Title Independent with a HEP.    Period Weeks    Status New      PT LONG TERM GOAL #3   Title Walk a community distance without CAM walker/boot with pain not > 3/10.    Time 4    Period Weeks      PT LONG TERM GOAL #4   Title Perform a reciprocating stair gait with one railing.     Time 4    Period Weeks    Status New      PT LONG TERM GOAL #5   Title Perform ADL's with right ankle pain not > 2/10.    Time 4    Status New                 Plan - 04/22/21 0941    Clinical Impression Statement Patient presented in clinic with reports of continued tightness and tenderness of R posterior calcaneus. Patient able to tolerate all stretching in standing and sitting. Patient instructed with use of firm ball such as baseball/golf ball as well as frozen water bottle to control pain in PF. Tenderness noted right at the attachment on the calcaneus for the achilles. Normal modalities response noted following removal of the modalities.    Personal Factors and Comorbidities Comorbidity 1;Other    Comorbidities SVT, DM, h/o right heel pain.    Examination-Activity Limitations Other;Locomotion Level    Examination-Participation Restrictions Other    Stability/Clinical Decision Making Stable/Uncomplicated    Rehab Potential Excellent    PT Frequency 3x / week    PT Duration 4 weeks    PT Treatment/Interventions ADLs/Self Care Home Management;Cryotherapy;Electrical Stimulation;Ultrasound;Moist Heat;Iontophoresis 4mg /ml Dexamethasone;Gait training;Stair training;Functional mobility training;Therapeutic activities;Therapeutic exercise;Neuromuscular re-education;Manual techniques;Patient/family education;Passive range of motion;Vasopneumatic Device    PT Next Visit Plan Gentle STW/M to patient's right calf/Achilles, seated Rockerboard/BAPS board, low-level combo e'stim/US, low-level e'stim/Vaso.    Consulted and Agree with Plan of Care Patient           Patient will benefit from skilled therapeutic intervention in order to improve the following deficits and impairments:  Abnormal gait,Pain,Decreased activity tolerance,Decreased range of motion,Increased edema  Visit Diagnosis: Pain in right ankle and joints of right foot  Stiffness of right ankle, not elsewhere  classified     Problem List Patient Active Problem List   Diagnosis Date Noted  . Appendicitis 03/18/2021  . E coli bacteremia 03/22/2020  . Bacteremia due to Gram-negative bacteria 03/20/2020  . Hypothyroidism   . Pyelonephritis 02/28/2020  . Intractable nausea and vomiting 02/28/2020  . Nasal sinus congestion 03/15/2018  . Subclinical hypothyroidism 08/05/2017  . Hyperlipidemia 07/28/2016  . Type 2 diabetes mellitus with hyperglycemia, without long-term current use of insulin (HCC) 04/15/2016  . Sinus tachycardia  11/12/2015  . S/P radiofrequency ablation operation for arrhythmia 02/24/2015  . SVT (supraventricular tachycardia) (HCC) 08/13/2014  . Palpitations 08/13/2014  . Obesity, Class I, BMI 30-34.9 09/30/2013  . HTN (hypertension) 08/01/2012    Marvell Fuller, PTA 04/22/2021, 9:45 AM  [facility] [address] [place], [place], [address] Phone: [phone]   Fax:  [phone]  Name: Julia [name] MRN: [id number] Date of Birth: [birth year]

## 2021-04-26 ENCOUNTER — Ambulatory Visit: Payer: 59 | Admitting: Physical Therapy

## 2021-04-26 ENCOUNTER — Encounter: Payer: Self-pay | Admitting: Physical Therapy

## 2021-04-26 ENCOUNTER — Other Ambulatory Visit: Payer: Self-pay

## 2021-04-26 DIAGNOSIS — M25671 Stiffness of right ankle, not elsewhere classified: Secondary | ICD-10-CM

## 2021-04-26 DIAGNOSIS — M25571 Pain in right ankle and joints of right foot: Secondary | ICD-10-CM | POA: Diagnosis not present

## 2021-04-26 NOTE — Therapy (Signed)
[facility] [address] [place], [place], [address] Phone: [phone]   Fax:  [phone]    Patient Details  Name: Julia [name] MRN: [id number] Date of Birth: 05-20-1971 Referring Provider (PT): Rodolph Bong MD   Encounter Date: 04/26/2021       Past Medical History:  Diagnosis Date  . Complication of anesthesia    I had a reaction to a epidural "broke out"  . Condyloma acuminatum of vulva   . COVID-19   . GERD (gastroesophageal reflux disease)   . History of supraventricular tachycardia   . HSV-1 infection   . HSV-2 infection   . HTN (hypertension)   . Hyperlipidemia   . Type 2 diabetes mellitus (HCC)     Past Surgical History:  Procedure Laterality Date  . CARDIOVASCULAR STRESS TEST  06-04-2007   normal nuclear study/  no ischemia/  normal LVF, ef 63%  . CESAREAN SECTION  09-06-2005  . LAPAROSCOPIC APPENDECTOMY N/A 03/18/2021   Procedure: APPENDECTOMY LAPAROSCOPIC;  Surgeon: Griselda Miner, MD;  Location: [facility];  Service: General;  Laterality: N/A;  . LASER ABLATION CONDOLAMATA N/A 10/08/2015   Procedure: CO2 LASER ABLATION CONDOLAMATA OF VULVA;  Surgeon: Richarda Overlie, MD;  Location: [facility];  Service: Gynecology;  Laterality: N/A;  . SUPRAVENTRICULAR TACHYCARDIA ABLATION N/A 02/24/2015   Procedure: SUPRAVENTRICULAR TACHYCARDIA ABLATION;  Surgeon: Marinus Maw, MD;  Location: [facility] CATH LAB;  Service: Cardiovascular;  Laterality: N/A;  . TRANSTHORACIC ECHOCARDIOGRAM  01-06-2009   normal LVF, ef 55-60%,  trace MR and TR/  normal stress echo    There were no vitals filed for this visit.   Subjective Assessment - 04/26/21 0903    Subjective COVID-19 screen performed prior to patient entering clinic.  Pain is low today.    Pertinent History SVT, DM, h/o right heel pain.    Patient Stated Goals Get back to normal.    Currently in Pain? Yes    Pain Score 2     Pain Orientation Right    Pain  Descriptors / Indicators Aching                             [facility] Adult PT Treatment/Exercise - 04/26/21 0001      Exercises   Exercises Knee/Hip      Knee/Hip Exercises: Aerobic   Nustep Level x 3 x 17 minutes.      Vasopneumatic   Number Minutes Vasopneumatic  20 minutes    Vasopnuematic Location  --   Right ankle.   Vasopneumatic Pressure Medium      Manual Therapy   Manual Therapy Soft tissue mobilization    Soft tissue mobilization STW/M x 9 minutes to patient's right Achilles region.                       PT Long Term Goals - 04/19/21 1042      PT LONG TERM GOAL #1   Title Independent with a HEP.    Period Weeks    Status New      PT LONG TERM GOAL #3   Title Walk a community distance without CAM walker/boot with pain not > 3/10.    Time 4    Period Weeks      PT LONG TERM GOAL #4   Title Perform a reciprocating stair gait with one railing.    Time 4  Period Weeks    Status New      PT LONG TERM GOAL #5   Title Perform ADL's with right ankle pain not > 2/10.    Time 4    Status New                 Plan - 04/26/21 1937    Clinical Impression Statement Patient did well with the addition of the Nustep.  She has new footwear with orthotic that is helping her as well.    Personal Factors and Comorbidities Comorbidity 1;Other    Comorbidities SVT, DM, h/o right heel pain.    Examination-Activity Limitations Other;Locomotion Level    Examination-Participation Restrictions Other    Stability/Clinical Decision Making Stable/Uncomplicated    Clinical Decision Making Low    Rehab Potential Excellent    PT Frequency 3x / week    PT Duration 4 weeks    PT Treatment/Interventions ADLs/Self Care Home Management;Cryotherapy;Electrical Stimulation;Ultrasound;Moist Heat;Iontophoresis 4mg /ml Dexamethasone;Gait training;Stair training;Functional mobility training;Therapeutic activities;Therapeutic exercise;Neuromuscular  re-education;Manual techniques;Patient/family education;Passive range of motion;Vasopneumatic Device    PT Next Visit Plan Gentle STW/M to patient's right calf/Achilles, seated Rockerboard/BAPS board, low-level combo e'stim/US, low-level e'stim/Vaso.           Patient will benefit from skilled therapeutic intervention in order to improve the following deficits and impairments:  Abnormal gait,Pain,Decreased activity tolerance,Decreased range of motion,Increased edema  Visit Diagnosis: Pain in right ankle and joints of right foot  Stiffness of right ankle, not elsewhere classified     Problem List Patient Active Problem List   Diagnosis Date Noted  . Appendicitis 03/18/2021  . E coli bacteremia 03/22/2020  . Bacteremia due to Gram-negative bacteria 03/20/2020  . Hypothyroidism   . Pyelonephritis 02/28/2020  . Intractable nausea and vomiting 02/28/2020  . Nasal sinus congestion 03/15/2018  . Subclinical hypothyroidism 08/05/2017  . Hyperlipidemia 07/28/2016  . Type 2 diabetes mellitus with hyperglycemia, without long-term current use of insulin (HCC) 04/15/2016  . Sinus tachycardia 11/12/2015  . S/P radiofrequency ablation operation for arrhythmia 02/24/2015  . SVT (supraventricular tachycardia) (HCC) 08/13/2014  . Palpitations 08/13/2014  . Obesity, Class I, BMI 30-34.9 09/30/2013  . HTN (hypertension) 08/01/2012    Oriel Ojo, 08/03/2012 MPT 04/26/2021, 10:09 AM  [facility] [address] [place], [place], [place] Phone: [phone]   Fax:  [phone]  Name: Julia [name] MRN: [name] Date of Birth: 1970/12/25

## 2021-04-28 ENCOUNTER — Ambulatory Visit: Payer: 59 | Admitting: Physical Therapy

## 2021-04-28 ENCOUNTER — Encounter: Payer: Self-pay | Admitting: Physical Therapy

## 2021-04-28 ENCOUNTER — Other Ambulatory Visit: Payer: Self-pay

## 2021-04-28 DIAGNOSIS — M25571 Pain in right ankle and joints of right foot: Secondary | ICD-10-CM

## 2021-04-28 DIAGNOSIS — M25671 Stiffness of right ankle, not elsewhere classified: Secondary | ICD-10-CM

## 2021-04-28 NOTE — Therapy (Signed)
[facility] [address] [place], [place], [address] Phone: [phone]   Fax:  [phone]  Physical Therapy Treatment  Patient Details  Name: MARIELLEN BLANEY MRN: [id number] Date of Birth: [birth year] Referring Provider (PT): Rodolph Bong MD   Encounter Date: 04/28/2021   PT End of Session - 04/28/21 1053    Visit Number 4    Number of Visits 12    Date for PT Re-Evaluation 05/17/21    Authorization Type FOTO.    PT Start Time 1036    PT Stop Time 1122    PT Time Calculation (min) 46 min    Activity Tolerance Patient tolerated treatment well    Behavior During Therapy WFL for tasks assessed/performed           Past Medical History:  Diagnosis Date  . Complication of anesthesia    I had a reaction to a epidural "broke out"  . Condyloma acuminatum of vulva   . COVID-19   . GERD (gastroesophageal reflux disease)   . History of supraventricular tachycardia   . HSV-1 infection   . HSV-2 infection   . HTN (hypertension)   . Hyperlipidemia   . Type 2 diabetes mellitus (HCC)     Past Surgical History:  Procedure Laterality Date  . CARDIOVASCULAR STRESS TEST  06-04-2007   normal nuclear study/  no ischemia/  normal LVF, ef 63%  . CESAREAN SECTION  09-06-2005  . LAPAROSCOPIC APPENDECTOMY N/A 03/18/2021   Procedure: APPENDECTOMY LAPAROSCOPIC;  Surgeon: Griselda Miner, MD;  Location: [facility];  Service: General;  Laterality: N/A;  . LASER ABLATION CONDOLAMATA N/A 10/08/2015   Procedure: CO2 LASER ABLATION CONDOLAMATA OF VULVA;  Surgeon: Richarda Overlie, MD;  Location: [facility];  Service: Gynecology;  Laterality: N/A;  . SUPRAVENTRICULAR TACHYCARDIA ABLATION N/A 02/24/2015   Procedure: SUPRAVENTRICULAR TACHYCARDIA ABLATION;  Surgeon: Marinus Maw, MD;  Location: [facility] CATH LAB;  Service: Cardiovascular;  Laterality: N/A;  . TRANSTHORACIC ECHOCARDIOGRAM  01-06-2009   normal LVF, ef 55-60%,  trace MR and TR/  normal stress  echo    There were no vitals filed for this visit.   Subjective Assessment - 04/28/21 1038    Subjective COVID-19 screen performed prior to patient entering clinic.  Reports more tightness today but walked around some yesterday without shoes on.    Pertinent History SVT, DM, h/o right heel pain.    Patient Stated Goals Get back to normal.    Currently in Pain? Yes    Pain Score 4     Pain Location Ankle    Pain Orientation Right    Pain Descriptors / Indicators Tightness    Pain Type Chronic pain    Pain Onset Today    Pain Frequency Constant              [facility] PT Assessment - 04/28/21 0001      Assessment   Medical Diagnosis Right foot pain    Referring Provider (PT) Rodolph Bong MD    Onset Date/Surgical Date 01/18/21    Next MD Visit 05/2021                         [facility] Adult PT Treatment/Exercise - 04/28/21 0001      Modalities   Modalities Vasopneumatic      Vasopneumatic   Number Minutes Vasopneumatic  10 minutes    Vasopnuematic Location  Ankle    Vasopneumatic Pressure Low  Vasopneumatic Temperature  34/edema      Manual Therapy   Manual Therapy Myofascial release    Myofascial Release IASTW to R achilles, gastroc in prone to reduce tightness      Ankle Exercises: Aerobic   Nustep L4 x12 min      Ankle Exercises: Standing   Rocker Board 3 minutes    Other Standing Ankle Exercises L forward lunge x15 reps for R achilles stretch    Other Standing Ankle Exercises B arch stretch with 2" step x5 reps dynamic; demonstrated calf stretch with 2" step as well                       PT Long Term Goals - 04/19/21 1042      PT LONG TERM GOAL #1   Title Independent with a HEP.    Period Weeks    Status New      PT LONG TERM GOAL #3   Title Walk a community distance without CAM walker/boot with pain not > 3/10.    Time 4    Period Weeks      PT LONG TERM GOAL #4   Title Perform a reciprocating stair gait with one railing.     Time 4    Period Weeks    Status New      PT LONG TERM GOAL #5   Title Perform ADL's with right ankle pain not > 2/10.    Time 4    Status New                 Plan - 04/28/21 1121    Clinical Impression Statement Patient presented in clinic with reports of greater R ankle tightness. Patient was shown different stretching techniques and felt a better stretch in knee extension. Patient also shown PF stretch that could be accomplished with 2" step or lift. 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 Increased redness noted with IASTW session to achilles tendon. No TPs noted in gastroc muscle belly. Increased edema notable around patient's R lateral malleoli. Normal vasopneumatic response noted following removal of the modality.    Personal Factors and Comorbidities Comorbidity 1;Other    Comorbidities SVT, DM, h/o right heel pain.    Examination-Activity Limitations Other;Locomotion Level    Examination-Participation Restrictions Other    Stability/Clinical Decision Making Stable/Uncomplicated    Rehab Potential Excellent    PT Frequency 3x / week    PT Duration 4 weeks    PT Treatment/Interventions ADLs/Self Care Home Management;Cryotherapy;Electrical Stimulation;Ultrasound;Moist Heat;Iontophoresis 4mg /ml Dexamethasone;Gait training;Stair training;Functional mobility training;Therapeutic activities;Therapeutic exercise;Neuromuscular re-education;Manual techniques;Patient/family education;Passive range of motion;Vasopneumatic Device    PT Next Visit Plan Gentle STW/M to patient's right calf/Achilles, seated Rockerboard/BAPS board, low-level combo e'stim/US, low-level e'stim/Vaso.    Consulted and Agree with Plan of Care Patient           Patient will benefit from skilled therapeutic intervention in order to improve the following deficits and impairments:  Abnormal gait,Pain,Decreased activity tolerance,Decreased range of motion,Increased edema  Visit  Diagnosis: Pain in right ankle and joints of right foot  Stiffness of right ankle, not elsewhere classified     Problem List Patient Active Problem List   Diagnosis Date Noted  . Appendicitis 03/18/2021  . E coli bacteremia 03/22/2020  . Bacteremia due to Gram-negative bacteria 03/20/2020  . Hypothyroidism   . Pyelonephritis 02/28/2020  . Intractable nausea and vomiting 02/28/2020  . Nasal sinus congestion 03/15/2018  . Subclinical hypothyroidism  08/05/2017  . Hyperlipidemia 07/28/2016  . Type 2 diabetes mellitus with hyperglycemia, without long-term current use of insulin (HCC) 04/15/2016  . Sinus tachycardia 11/12/2015  . S/P radiofrequency ablation operation for arrhythmia 02/24/2015  . SVT (supraventricular tachycardia) (HCC) 08/13/2014  . Palpitations 08/13/2014  . Obesity, Class I, BMI 30-34.9 09/30/2013  . HTN (hypertension) 08/01/2012    Marvell Fuller, PTA 04/28/2021, 11:40 AM  [facility] [address] [place], [place], [address] Phone: [phone]   Fax:  [phone]  Name: ALLYSHA TRYON MRN: [id number] Date of Birth: 1971/04/18

## 2021-05-03 ENCOUNTER — Ambulatory Visit: Payer: 59 | Admitting: Physical Therapy

## 2021-05-03 ENCOUNTER — Encounter: Payer: Self-pay | Admitting: Physical Therapy

## 2021-05-03 ENCOUNTER — Other Ambulatory Visit: Payer: Self-pay

## 2021-05-03 DIAGNOSIS — M25571 Pain in right ankle and joints of right foot: Secondary | ICD-10-CM

## 2021-05-03 DIAGNOSIS — M25671 Stiffness of right ankle, not elsewhere classified: Secondary | ICD-10-CM

## 2021-05-03 NOTE — Therapy (Signed)
[facility] [address] [place], [place], [address] Phone: [phone]   Fax:  [phone]  Physical Therapy Treatment  Patient Details  Name: Julia [name] MRN: [id number] Date of Birth: [birth year] Referring Provider (PT): Rodolph Bong MD   Encounter Date: 05/03/2021   PT End of Session - 05/03/21 1310    Visit Number 5    Number of Visits 12    Date for PT Re-Evaluation 05/17/21    Authorization Type FOTO.    PT Start Time 0104    PT Stop Time 0144    PT Time Calculation (min) 40 min    Activity Tolerance Patient tolerated treatment well    Behavior During Therapy WFL for tasks assessed/performed           Past Medical History:  Diagnosis Date  . Complication of anesthesia    I had a reaction to a epidural "broke out"  . Condyloma acuminatum of vulva   . COVID-19   . GERD (gastroesophageal reflux disease)   . History of supraventricular tachycardia   . HSV-1 infection   . HSV-2 infection   . HTN (hypertension)   . Hyperlipidemia   . Type 2 diabetes mellitus (HCC)     Past Surgical History:  Procedure Laterality Date  . CARDIOVASCULAR STRESS TEST  06-04-2007   normal nuclear study/  no ischemia/  normal LVF, ef 63%  . CESAREAN SECTION  09-06-2005  . LAPAROSCOPIC APPENDECTOMY N/A 03/18/2021   Procedure: APPENDECTOMY LAPAROSCOPIC;  Surgeon: Griselda Miner, MD;  Location: [facility];  Service: General;  Laterality: N/A;  . LASER ABLATION CONDOLAMATA N/A 10/08/2015   Procedure: CO2 LASER ABLATION CONDOLAMATA OF VULVA;  Surgeon: Richarda Overlie, MD;  Location: [facility];  Service: Gynecology;  Laterality: N/A;  . SUPRAVENTRICULAR TACHYCARDIA ABLATION N/A 02/24/2015   Procedure: SUPRAVENTRICULAR TACHYCARDIA ABLATION;  Surgeon: Marinus Maw, MD;  Location: [facility] CATH LAB;  Service: Cardiovascular;  Laterality: N/A;  . TRANSTHORACIC ECHOCARDIOGRAM  01-06-2009   normal LVF, ef 55-60%,  trace MR and TR/  normal stress  echo    There were no vitals filed for this visit.   Subjective Assessment - 05/03/21 1309    Subjective COVID-19 screen performed prior to patient entering clinic.  pain low.  Did good after last treatment.    Pertinent History SVT, DM, h/o right heel pain.    Patient Stated Goals Get back to normal.    Currently in Pain? Yes    Pain Score 1     Pain Location Ankle    Pain Orientation Right    Pain Descriptors / Indicators Tightness    Pain Type Chronic pain    Pain Onset Today                             [facility] Adult PT Treatment/Exercise - 05/03/21 0001      Exercises   Exercises Knee/Hip      Knee/Hip Exercises: Aerobic   Nustep Level 4 x 15 minutes      Vasopneumatic   Number Minutes Vasopneumatic  10 minutes    Vasopnuematic Location  --   RT ankle.   Vasopneumatic Pressure Medium      Manual Therapy   Manual Therapy Soft tissue mobilization    Soft tissue mobilization IASTM x 10 minutes to patient's right Achilles and calf.  PT Long Term Goals - 04/19/21 1042      PT LONG TERM GOAL #1   Title Independent with a HEP.    Period Weeks    Status New      PT LONG TERM GOAL #3   Title Walk a community distance without CAM walker/boot with pain not > 3/10.    Time 4    Period Weeks      PT LONG TERM GOAL #4   Title Perform a reciprocating stair gait with one railing.    Time 4    Period Weeks    Status New      PT LONG TERM GOAL #5   Title Perform ADL's with right ankle pain not > 2/10.    Time 4    Status New                 Plan - 05/03/21 1337    Clinical Impression Statement Patient progessing well.  She had a very busy weekend but her pain was quite low.  Pain seems now to only occur when walking.  She did very well with IASTM.    Personal Factors and Comorbidities Comorbidity 1;Other    Comorbidities SVT, DM, h/o right heel pain.    Examination-Activity Limitations Other;Locomotion Level     Examination-Participation Restrictions Other    Stability/Clinical Decision Making Stable/Uncomplicated    Rehab Potential Excellent    PT Frequency 3x / week    PT Duration 4 weeks    PT Treatment/Interventions ADLs/Self Care Home Management;Cryotherapy;Electrical Stimulation;Ultrasound;Moist Heat;Iontophoresis 4mg /ml Dexamethasone;Gait training;Stair training;Functional mobility training;Therapeutic activities;Therapeutic exercise;Neuromuscular re-education;Manual techniques;Patient/family education;Passive range of motion;Vasopneumatic Device    PT Next Visit Plan Gentle STW/M to patient's right calf/Achilles, seated Rockerboard/BAPS board, low-level combo e'stim/US, low-level e'stim/Vaso.    Consulted and Agree with Plan of Care Patient           Patient will benefit from skilled therapeutic intervention in order to improve the following deficits and impairments:  Abnormal gait,Pain,Decreased activity tolerance,Decreased range of motion,Increased edema  Visit Diagnosis: Pain in right ankle and joints of right foot  Stiffness of right ankle, not elsewhere classified     Problem List Patient Active Problem List   Diagnosis Date Noted  . Appendicitis 03/18/2021  . E coli bacteremia 03/22/2020  . Bacteremia due to Gram-negative bacteria 03/20/2020  . Hypothyroidism   . Pyelonephritis 02/28/2020  . Intractable nausea and vomiting 02/28/2020  . Nasal sinus congestion 03/15/2018  . Subclinical hypothyroidism 08/05/2017  . Hyperlipidemia 07/28/2016  . Type 2 diabetes mellitus with hyperglycemia, without long-term current use of insulin (HCC) 04/15/2016  . Sinus tachycardia 11/12/2015  . S/P radiofrequency ablation operation for arrhythmia 02/24/2015  . SVT (supraventricular tachycardia) (HCC) 08/13/2014  . Palpitations 08/13/2014  . Obesity, Class I, BMI 30-34.9 09/30/2013  . HTN (hypertension) 08/01/2012    Averyana Pillars, 08/03/2012 MPT 05/03/2021, 2:22 PM  [facility] [address] [place], [place], [place] Phone: [phone]   Fax:  [phone]  Name: HANYA GUERIN MRN: [name] Date of Birth: [birth year]

## 2021-05-05 ENCOUNTER — Encounter: Payer: Self-pay | Admitting: Physical Therapy

## 2021-05-05 ENCOUNTER — Other Ambulatory Visit: Payer: Self-pay

## 2021-05-05 ENCOUNTER — Ambulatory Visit: Payer: 59 | Attending: Sports Medicine | Admitting: Physical Therapy

## 2021-05-05 DIAGNOSIS — M25671 Stiffness of right ankle, not elsewhere classified: Secondary | ICD-10-CM | POA: Insufficient documentation

## 2021-05-05 DIAGNOSIS — M25571 Pain in right ankle and joints of right foot: Secondary | ICD-10-CM | POA: Diagnosis present

## 2021-05-05 NOTE — Therapy (Signed)
[facility] [address] [place], [place], [address] Phone: [phone]   Fax:  [phone]  Physical Therapy Treatment  Patient Details  Name: Julia [name] MRN: [id number] Date of Birth: [birth year] Referring Provider (PT): Rodolph Bong MD   Encounter Date: 05/05/2021   PT End of Session - 05/05/21 1315    Visit Number 6    Number of Visits 12    Date for PT Re-Evaluation 05/17/21    Authorization Type FOTO.    PT Start Time 1304    PT Stop Time 1347    PT Time Calculation (min) 43 min    Activity Tolerance Patient tolerated treatment well    Behavior During Therapy WFL for tasks assessed/performed           Past Medical History:  Diagnosis Date  . Complication of anesthesia    I had a reaction to a epidural "broke out"  . Condyloma acuminatum of vulva   . COVID-19   . GERD (gastroesophageal reflux disease)   . History of supraventricular tachycardia   . HSV-1 infection   . HSV-2 infection   . HTN (hypertension)   . Hyperlipidemia   . Type 2 diabetes mellitus (HCC)     Past Surgical History:  Procedure Laterality Date  . CARDIOVASCULAR STRESS TEST  06-04-2007   normal nuclear study/  no ischemia/  normal LVF, ef 63%  . CESAREAN SECTION  09-06-2005  . LAPAROSCOPIC APPENDECTOMY N/A 03/18/2021   Procedure: APPENDECTOMY LAPAROSCOPIC;  Surgeon: Griselda Miner, MD;  Location: [facility];  Service: General;  Laterality: N/A;  . LASER ABLATION CONDOLAMATA N/A 10/08/2015   Procedure: CO2 LASER ABLATION CONDOLAMATA OF VULVA;  Surgeon: Richarda Overlie, MD;  Location: [facility] [place] [facility];  Service: Gynecology;  Laterality: N/A;  . SUPRAVENTRICULAR TACHYCARDIA ABLATION N/A 02/24/2015   Procedure: SUPRAVENTRICULAR TACHYCARDIA ABLATION;  Surgeon: Marinus Maw, MD;  Location: [facility] CATH LAB;  Service: Cardiovascular;  Laterality: N/A;  . TRANSTHORACIC ECHOCARDIOGRAM  01-06-2009   normal LVF, ef 55-60%,  trace MR and TR/  normal stress  echo    There were no vitals filed for this visit.   Subjective Assessment - 05/05/21 1314    Subjective COVID-19 screen performed prior to patient entering clinic.  pain low. Reports more L knee pain and foot pain after going to [employer] last night. R foot is doing better and likes the IASTW sessions.    Pertinent History SVT, DM, h/o right heel pain.    Patient Stated Goals Get back to normal.    Currently in Pain? Yes    Pain Score 1     Pain Location Ankle    Pain Orientation Right    Pain Descriptors / Indicators Discomfort    Pain Type Chronic pain    Pain Onset Today    Pain Frequency Intermittent              [facility] PT Assessment - 05/05/21 0001      Assessment   Medical Diagnosis Right foot pain    Referring Provider (PT) Rodolph Bong MD    Onset Date/Surgical Date 01/18/21    Next MD Visit 05/2021                         [facility] Adult PT Treatment/Exercise - 05/05/21 0001      Modalities   Modalities Vasopneumatic      Vasopneumatic   Number Minutes Vasopneumatic  10  minutes    Vasopnuematic Location  Ankle    Vasopneumatic Pressure Low    Vasopneumatic Temperature  34/edema      Manual Therapy   Manual Therapy Soft tissue mobilization    Soft tissue mobilization IASTW to R achilles to reduce tone      Ankle Exercises: Aerobic   Stationary Bike L2, seat 5 x4 min   stopped due to L knee pain   Nustep L3, seat 8 x7 min      Ankle Exercises: Standing   Rocker Board 3 minutes    Heel Raises Both;15 reps   eccentric off 2" step   Toe Raise 15 reps;Other (comment)   eccentric off 2" step                      PT Long Term Goals - 04/19/21 1042      PT LONG TERM GOAL #1   Title Independent with a HEP.    Period Weeks    Status New      PT LONG TERM GOAL #3   Title Walk a community distance without CAM walker/boot with pain not > 3/10.    Time 4    Period Weeks      PT LONG TERM GOAL #4   Title Perform a  reciprocating stair gait with one railing.    Time 4    Period Weeks    Status New      PT LONG TERM GOAL #5   Title Perform ADL's with right ankle pain not > 2/10.    Time 4    Status New                 Plan - 05/05/21 1356    Clinical Impression Statement Patient presented with more pain of L knee and ankle after going to [employer] last night. Patient unable to tolerate very long on stationary bike due to knee pain. Patient progressed to eccentric strengthening of achilles in standing. Great redness response noted with IASTW to R achilles and reported raw, tenderness lateral to R achilles. Normal vasopneumatic response noted following removal of the modality.    Personal Factors and Comorbidities Comorbidity 1;Other    Comorbidities SVT, DM, h/o right heel pain.    Examination-Activity Limitations Other;Locomotion Level    Examination-Participation Restrictions Other    Stability/Clinical Decision Making Stable/Uncomplicated    Rehab Potential Excellent    PT Frequency 3x / week    PT Duration 4 weeks    PT Treatment/Interventions ADLs/Self Care Home Management;Cryotherapy;Electrical Stimulation;Ultrasound;Moist Heat;Iontophoresis 4mg /ml Dexamethasone;Gait training;Stair training;Functional mobility training;Therapeutic activities;Therapeutic exercise;Neuromuscular re-education;Manual techniques;Patient/family education;Passive range of motion;Vasopneumatic Device    PT Next Visit Plan Gentle STW/M to patient's right calf/Achilles, seated Rockerboard/BAPS board, low-level combo e'stim/US, low-level e'stim/Vaso.    Consulted and Agree with Plan of Care Patient           Patient will benefit from skilled therapeutic intervention in order to improve the following deficits and impairments:  Abnormal gait,Pain,Decreased activity tolerance,Decreased range of motion,Increased edema  Visit Diagnosis: Pain in right ankle and joints of right foot  Stiffness of right ankle, not  elsewhere classified     Problem List Patient Active Problem List   Diagnosis Date Noted  . Appendicitis 03/18/2021  . E coli bacteremia 03/22/2020  . Bacteremia due to Gram-negative bacteria 03/20/2020  . Hypothyroidism   . Pyelonephritis 02/28/2020  . Intractable nausea and vomiting 02/28/2020  . Nasal sinus congestion 03/15/2018  .  Subclinical hypothyroidism 08/05/2017  . Hyperlipidemia 07/28/2016  . Type 2 diabetes mellitus with hyperglycemia, without long-term current use of insulin (HCC) 04/15/2016  . Sinus tachycardia 11/12/2015  . S/P radiofrequency ablation operation for arrhythmia 02/24/2015  . SVT (supraventricular tachycardia) (HCC) 08/13/2014  . Palpitations 08/13/2014  . Obesity, Class I, BMI 30-34.9 09/30/2013  . HTN (hypertension) 08/01/2012    Earnest Rosier 05/05/2021, 2:05 PM  [facility] [address] [place], [place], [address] Phone: [phone]   Fax:  [phone]  Name: AVIA MERKLEY MRN: [id number] Date of Birth: August 29, 1971

## 2021-05-10 ENCOUNTER — Other Ambulatory Visit: Payer: Self-pay

## 2021-05-10 ENCOUNTER — Ambulatory Visit: Payer: 59 | Admitting: Physical Therapy

## 2021-05-10 ENCOUNTER — Encounter: Payer: Self-pay | Admitting: Physical Therapy

## 2021-05-10 DIAGNOSIS — M25571 Pain in right ankle and joints of right foot: Secondary | ICD-10-CM | POA: Diagnosis not present

## 2021-05-10 DIAGNOSIS — M25671 Stiffness of right ankle, not elsewhere classified: Secondary | ICD-10-CM

## 2021-05-10 NOTE — Therapy (Signed)
[facility] [address] [place], [place], [address] Phone: [phone]   Fax:  [phone]  Physical Therapy Treatment  Patient Details  Name: Julia [name] MRN: [id number] Date of Birth: 16-Nov-1971 Referring Provider (PT): Rodolph Bong MD   Encounter Date: 05/10/2021   PT End of Session - 05/10/21 1316    Visit Number 7    Number of Visits 12    Date for PT Re-Evaluation 05/17/21    Authorization Type FOTO.    PT Start Time 1305    PT Stop Time 1326    PT Time Calculation (min) 21 min    Activity Tolerance Patient tolerated treatment well    Behavior During Therapy WFL for tasks assessed/performed           Past Medical History:  Diagnosis Date  . Complication of anesthesia    I had a reaction to a epidural "broke out"  . Condyloma acuminatum of vulva   . COVID-19   . GERD (gastroesophageal reflux disease)   . History of supraventricular tachycardia   . HSV-1 infection   . HSV-2 infection   . HTN (hypertension)   . Hyperlipidemia   . Type 2 diabetes mellitus (HCC)     Past Surgical History:  Procedure Laterality Date  . CARDIOVASCULAR STRESS TEST  06-04-2007   normal nuclear study/  no ischemia/  normal LVF, ef 63%  . CESAREAN SECTION  09-06-2005  . LAPAROSCOPIC APPENDECTOMY N/A 03/18/2021   Procedure: APPENDECTOMY LAPAROSCOPIC;  Surgeon: Griselda Miner, MD;  Location: [facility];  Service: General;  Laterality: N/A;  . LASER ABLATION CONDOLAMATA N/A 10/08/2015   Procedure: CO2 LASER ABLATION CONDOLAMATA OF VULVA;  Surgeon: Richarda Overlie, MD;  Location: [facility] [place];  Service: Gynecology;  Laterality: N/A;  . SUPRAVENTRICULAR TACHYCARDIA ABLATION N/A 02/24/2015   Procedure: SUPRAVENTRICULAR TACHYCARDIA ABLATION;  Surgeon: Marinus Maw, MD;  Location: [facility] CATH LAB;  Service: Cardiovascular;  Laterality: N/A;  . TRANSTHORACIC ECHOCARDIOGRAM  01-06-2009   normal LVF, ef 55-60%,  trace MR and TR/  normal stress  echo    There were no vitals filed for this visit.   Subjective Assessment - 05/10/21 1311    Subjective COVID 19 screening performed on patient upon arrival. Patient reports that she thinks she ripped her calf more on Friday. Patient had gone to get a pedicure and had on flip flops. Patient returned home and had to use the bathroom so she tried to rush into the house fast and was climbing stairs when she felt a pop. Has had more bruising, pain, swelling in R calf.    Pertinent History SVT, DM, h/o right heel pain.    Patient Stated Goals Get back to normal.    Currently in Pain? Yes    Pain Score 5     Pain Location Calf    Pain Orientation Right    Pain Descriptors / Indicators Discomfort    Pain Type Chronic pain    Pain Onset In the past 7 days    Pain Frequency Constant              [facility] PT Assessment - 05/10/21 0001      Assessment   Medical Diagnosis Right foot pain    Referring Provider (PT) Rodolph Bong MD    Onset Date/Surgical Date 01/18/21    Next MD Visit 05/16/2021  [facility] Adult PT Treatment/Exercise - 05/10/21 0001      Modalities   Modalities Vasopneumatic;Electrical Stimulation      Electrical Stimulation   Electrical Stimulation Location R ankle, calf    Electrical Stimulation Action Pre-Mod    Electrical Stimulation Parameters 80-150 hz x15 min    Electrical Stimulation Goals Pain;Edema      Vasopneumatic   Number Minutes Vasopneumatic  15 minutes    Vasopnuematic Location  Ankle    Vasopneumatic Pressure Low    Vasopneumatic Temperature  34/edema and pain                       PT Long Term Goals - 04/19/21 1042      PT LONG TERM GOAL #1   Title Independent with a HEP.    Period Weeks    Status New      PT LONG TERM GOAL #3   Title Walk a community distance without CAM walker/boot with pain not > 3/10.    Time 4    Period Weeks      PT LONG TERM GOAL #4   Title Perform a reciprocating  stair gait with one railing.    Time 4    Period Weeks    Status New      PT LONG TERM GOAL #5   Title Perform ADL's with right ankle pain not > 2/10.    Time 4    Status New                 Plan - 05/10/21 1317    Clinical Impression Statement Patient presented in clinic with reports of incident to R calf. Patient was trying to move quickly up stairs at her home on Friday with flip flops on when she felt a pop in R calf and rated the pain as 8/10. Patient has experienced increased edema throughout R calf and foot since Friday. Patient had light ecchymosis over medial R foot. Patient had been standing more in the last few days without brace. [place] App[place], MPT was brought in for full assessment in which he instructed for edema control treatment only. Patient to return to MD on 05/16/2021. Normal modalities response noted following removal of the modalities.    Personal Factors and Comorbidities Comorbidity 1;Other    Comorbidities SVT, DM, h/o right heel pain.    Examination-Activity Limitations Other;Locomotion Level    Examination-Participation Restrictions Other    Stability/Clinical Decision Making Stable/Uncomplicated    Rehab Potential Excellent    PT Frequency 3x / week    PT Duration 4 weeks    PT Treatment/Interventions ADLs/Self Care Home Management;Cryotherapy;Electrical Stimulation;Ultrasound;Moist Heat;Iontophoresis 4mg /ml Dexamethasone;Gait training;Stair training;Functional mobility training;Therapeutic activities;Therapeutic exercise;Neuromuscular re-education;Manual techniques;Patient/family education;Passive range of motion;Vasopneumatic Device    PT Next Visit Plan Awaiting MD visit.    Consulted and Agree with Plan of Care Patient           Patient will benefit from skilled therapeutic intervention in order to improve the following deficits and impairments:  Abnormal gait,Pain,Decreased activity tolerance,Decreased range of motion,Increased edema  Visit  Diagnosis: Pain in right ankle and joints of right foot  Stiffness of right ankle, not elsewhere classified     Problem List Patient Active Problem List   Diagnosis Date Noted  . Appendicitis 03/18/2021  . E coli bacteremia 03/22/2020  . Bacteremia due to Gram-negative bacteria 03/20/2020  . Hypothyroidism   . Pyelonephritis 02/28/2020  . Intractable nausea and vomiting 02/28/2020  .  Nasal sinus congestion 03/15/2018  . Subclinical hypothyroidism 08/05/2017  . Hyperlipidemia 07/28/2016  . Type 2 diabetes mellitus with hyperglycemia, without long-term current use of insulin (HCC) 04/15/2016  . Sinus tachycardia 11/12/2015  . S/P radiofrequency ablation operation for arrhythmia 02/24/2015  . SVT (supraventricular tachycardia) (HCC) 08/13/2014  . Palpitations 08/13/2014  . Obesity, Class I, BMI 30-34.9 09/30/2013  . HTN (hypertension) 08/01/2012    Marvell Fuller, PTA 05/10/2021, 1:51 PM  [facility] [address] [place], [place], [address] Phone: [phone]   Fax:  [phone]  Name: TASHEIKA KITZMILLER MRN: [id number] Date of Birth: 12-08-1970

## 2021-05-12 ENCOUNTER — Encounter: Payer: 59 | Admitting: Physical Therapy

## 2021-05-16 ENCOUNTER — Emergency Department (HOSPITAL_COMMUNITY)
Admission: EM | Admit: 2021-05-16 | Discharge: 2021-05-17 | Discharge status: Against Medical Advice | Payer: 59 | Source: Home / Self Care

## 2021-05-16 ENCOUNTER — Other Ambulatory Visit: Payer: Self-pay

## 2021-05-16 DIAGNOSIS — Z87440 Personal history of urinary (tract) infections: Secondary | ICD-10-CM | POA: Insufficient documentation

## 2021-05-16 DIAGNOSIS — A084 Viral intestinal infection, unspecified: Secondary | ICD-10-CM | POA: Insufficient documentation

## 2021-05-16 NOTE — ED Notes (Signed)
Pt called for triage x1! No answer °

## 2021-05-17 ENCOUNTER — Other Ambulatory Visit: Payer: Self-pay

## 2021-05-17 ENCOUNTER — Ambulatory Visit (HOSPITAL_COMMUNITY)
Admission: EM | Admit: 2021-05-17 | Discharge: 2021-05-17 | Discharge status: Against Medical Advice | Payer: 59 | Attending: Student | Admitting: Student

## 2021-05-17 ENCOUNTER — Encounter (HOSPITAL_COMMUNITY): Payer: Self-pay | Admitting: Emergency Medicine

## 2021-05-17 DIAGNOSIS — A084 Viral intestinal infection, unspecified: Secondary | ICD-10-CM

## 2021-05-17 DIAGNOSIS — Z87440 Personal history of urinary (tract) infections: Secondary | ICD-10-CM | POA: Insufficient documentation

## 2021-05-17 DIAGNOSIS — Z87448 Personal history of other diseases of urinary system: Secondary | ICD-10-CM

## 2021-05-17 LAB — POCT URINALYSIS DIPSTICK, ED / UC
Bilirubin Urine: NEGATIVE
Glucose, UA: NEGATIVE mg/dL
Hgb urine dipstick: NEGATIVE
Ketones, ur: NEGATIVE mg/dL
Leukocytes,Ua: NEGATIVE
Nitrite: NEGATIVE
Protein, ur: NEGATIVE mg/dL
Specific Gravity, Urine: <=1.005 (ref 1.005–1.030)
Urobilinogen, UA: 0.2 mg/dL (ref 0.0–1.0)
pH: 5.5 (ref 5.0–8.0)

## 2021-05-17 MED ORDER — ONDANSETRON 8 MG PO TBDP: 8.0000 mg | ORAL_TABLET | Freq: Three times a day (TID) | ORAL | 0 refills | Status: DC | PRN

## 2021-05-17 NOTE — ED Provider Notes (Signed)
[facility]    CSN: [id number] Arrival date & time: 05/17/21  1408      History   Chief Complaint Chief Complaint  Patient presents with   Emesis   Dysuria    HPI Julia [name] is a 49 y.o. female presenting with urinary symptoms.  Medical history pyelonephritis and sepsis related to this 2021, hypertension, hyperlipidemia, diabetes, GERD.  Followed closely by urology, states she has been taking daily trimethoprim for 6 months to prevent her recurrent UTI and pyelonephritis.  Today notes 7 episodes of emesis in the last 24 hours, seemed to improve but then she developed a few more episodes few hours ago today. Same number of episodes of diarrhea. Has only had three bottles of water today. Endorses some mild dysuria, but denies other urinary symptoms. Denies hematuria, frequency, urgency, back pain, n/v/d/abd pain, fevers/chills, abdnormal vaginal discharge. Already has urology appt scheduled for 6/16. History appendectomy, but she still has her gallbladder.  HPI  Past Medical History:  Diagnosis Date   Complication of anesthesia    I had a reaction to a epidural "broke out"   Condyloma acuminatum of vulva    COVID-19    GERD (gastroesophageal reflux disease)    History of supraventricular tachycardia    HSV-1 infection    HSV-2 infection    HTN (hypertension)    Hyperlipidemia    Type 2 diabetes mellitus (HCC)     Patient Active Problem List   Diagnosis Date Noted   Appendicitis 03/18/2021   E coli bacteremia 03/22/2020   Bacteremia due to Gram-negative bacteria 03/20/2020   Hypothyroidism    Pyelonephritis 02/28/2020   Intractable nausea and vomiting 02/28/2020   Nasal sinus congestion 03/15/2018   Subclinical hypothyroidism 08/05/2017   Hyperlipidemia 07/28/2016   Type 2 diabetes mellitus with hyperglycemia, without long-term current use of insulin (HCC) 04/15/2016   Sinus tachycardia 11/12/2015   S/P radiofrequency ablation operation for arrhythmia  02/24/2015   SVT (supraventricular tachycardia) (HCC) 08/13/2014   Palpitations 08/13/2014   Obesity, Class I, BMI 30-34.9 09/30/2013   HTN (hypertension) 08/01/2012    Past Surgical History:  Procedure Laterality Date   CARDIOVASCULAR STRESS TEST  06-04-2007   normal nuclear study/  no ischemia/  normal LVF, ef 63%   CESAREAN SECTION  09-06-2005   LAPAROSCOPIC APPENDECTOMY N/A 03/18/2021   Procedure: APPENDECTOMY LAPAROSCOPIC;  Surgeon: Griselda Miner, MD;  Location: [facility];  Service: General;  Laterality: N/A;   LASER ABLATION CONDOLAMATA N/A 10/08/2015   Procedure: CO2 LASER ABLATION CONDOLAMATA OF VULVA;  Surgeon: Richarda Overlie, MD;  Location: [facility];  Service: Gynecology;  Laterality: N/A;   SUPRAVENTRICULAR TACHYCARDIA ABLATION N/A 02/24/2015   Procedure: SUPRAVENTRICULAR TACHYCARDIA ABLATION;  Surgeon: Marinus Maw, MD;  Location: [facility] CATH LAB;  Service: Cardiovascular;  Laterality: N/A;   TRANSTHORACIC ECHOCARDIOGRAM  01-06-2009   normal LVF, ef 55-60%,  trace MR and TR/  normal stress echo    OB History   No obstetric history on file.      Home Medications    Prior to Admission medications   Medication Sig Start Date End Date Taking? Authorizing Provider  ondansetron (ZOFRAN ODT) 8 MG disintegrating tablet Take 1 tablet (8 mg total) by mouth every 8 (eight) hours as needed for nausea or vomiting. 05/17/21  Yes Rhys Martini, PA-C  ferrous sulfate 324 MG TBEC Take 324 mg by mouth daily with breakfast.    [provider]  furosemide (LASIX) 40  MG tablet Take 20-40 mg by mouth daily as needed for fluid. 03/21/20   [provider]  glucose blood (ONE TOUCH ULTRA TEST) test strip USE AS DIRECTED 10/04/17   Carlus PavlovGherghe, Cristina, MD  HYDROcodone-acetaminophen (NORCO/VICODIN) 5-325 MG tablet Take 1-2 tablets by mouth every 6 (six) hours as needed for moderate pain. 03/19/21   Griselda Mineroth, Paul III, MD  HYDROcodone-acetaminophen (NORCO/VICODIN) 5-325 MG  tablet Take 1-2 tablets by mouth every 6 (six) hours as needed for moderate pain or severe pain. 03/19/21   Chevis Prettyoth, Paul III, MD  Insulin Glargine [facility](BASAGLAR KWIKPEN) 100 UNIT/ML Inject 24-34 Units into the skin at bedtime. 12/16/20   [provider]  ipratropium (ATROVENT) 0.06 % nasal spray Place 2 sprays into both nostrils daily as needed for congestion. 02/22/20   [provider]  levothyroxine (SYNTHROID) 88 MCG tablet Take 88 mcg by mouth every morning. 12/30/20   [provider]  loratadine (CLARITIN) 10 MG tablet Take 10 mg by mouth daily as needed for allergies.    [provider]  metFORMIN (GLUCOPHAGE) 1000 MG tablet Take 1,000 mg by mouth 2 (two) times daily with a meal.    [provider]  metoprolol succinate (TOPROL-XL) 50 MG 24 hr tablet Take 50 mg by mouth in the morning and at bedtime. 02/06/21   [provider]  pantoprazole (PROTONIX) 40 MG tablet Take 40 mg by mouth 2 (two) times daily. 03/09/21   [provider]  propranolol (INDERAL) 10 MG tablet Take 10 mg by mouth 4 (four) times daily as needed (heart papatations).     [provider]  trimethoprim (TRIMPEX) 100 MG tablet Take 100 mg by mouth daily. 03/11/21   [provider]  vitamin B-12 (CYANOCOBALAMIN) 1000 MCG tablet Take 1,000 mcg by mouth daily.    [provider]    Family History Family History  Problem Relation Age of Onset   Hypertension Mother    Thyroid disease Mother    Diabetes Mother    Thyroid disease Sister    Diabetes Maternal Grandmother    Hyperlipidemia Other    Thyroid disease Other     Social History Social History   Tobacco Use   Smoking status: Former    Years: 8.00    Pack years: 0.00    Types: Cigarettes    Quit date: 12/16/2002    Years since quitting: 18.4   Smokeless tobacco: Never  Vaping Use   Vaping Use: Never used  Substance Use Topics   Alcohol use: Yes    Alcohol/week: 2.0 standard drinks     Types: 2 Standard drinks or equivalent per week    Comment: rare   Drug use: No     Allergies   Elemental sulfur, Quinolones, Sulfa antibiotics, Trulicity [dulaglutide], Hctz [hydrochlorothiazide], and Invokana [canagliflozin]   Review of Systems Review of Systems  Constitutional:  Negative for appetite change, chills, diaphoresis and fever.  Respiratory:  Negative for shortness of breath.   Cardiovascular:  Negative for chest pain.  Gastrointestinal:  Positive for diarrhea, nausea and vomiting. Negative for abdominal pain, blood in stool and constipation.  Genitourinary:  Positive for dysuria. Negative for decreased urine volume, difficulty urinating, flank pain, frequency, genital sores, hematuria and urgency.  Musculoskeletal:  Negative for back pain.  Neurological:  Negative for dizziness, weakness and light-headedness.  All other systems reviewed and are negative.   Physical Exam Triage Vital Signs ED Triage Vitals  Enc Vitals Group     BP 05/17/21  1544 (!) 132/91     Pulse Rate 05/17/21 1544 80     Resp 05/17/21 1544 18     Temp 05/17/21 1544 98.5 F (36.9 C)     Temp Source 05/17/21 1544 Oral     SpO2 05/17/21 1544 100 %     Weight --      Height --      Head Circumference --      Peak Flow --      Pain Score 05/17/21 1546 0     Pain Loc --      Pain Edu? --      Excl. in GC? --    No data found.  Updated Vital Signs BP (!) 132/91 (BP Location: Right Arm)   Pulse 80   Temp 98.5 F (36.9 C) (Oral)   Resp 18   LMP 07/22/2019   SpO2 100%   Visual Acuity Right Eye Distance:   Left Eye Distance:   Bilateral Distance:    Right Eye Near:   Left Eye Near:    Bilateral Near:     Physical Exam Vitals reviewed.  Constitutional:      General: She is not in acute distress.    Appearance: Normal appearance. She is not ill-appearing.  HENT:     Head: Normocephalic and atraumatic.     Mouth/Throat:     Mouth: Mucous membranes are moist.     Comments:  Moist mucous membranes Eyes:     Extraocular Movements: Extraocular movements intact.     Pupils: Pupils are equal, round, and reactive to light.  Cardiovascular:     Rate and Rhythm: Normal rate and regular rhythm.     Heart sounds: Normal heart sounds.  Pulmonary:     Effort: Pulmonary effort is normal.     Breath sounds: Normal breath sounds. No wheezing, rhonchi or rales.  Abdominal:     General: Bowel sounds are increased. There is no distension.     Palpations: Abdomen is soft. There is no mass.     Tenderness: There is no abdominal tenderness. There is no right CVA tenderness, left CVA tenderness, guarding or rebound. Negative signs include Murphy's sign, Rovsing's sign and McBurney's sign.     Comments: BP positive throughout NO abdominal pain or CVAT  Skin:    General: Skin is warm.     Capillary Refill: Capillary refill takes less than 2 seconds.     Comments: Good skin turgor  Neurological:     General: No focal deficit present.     Mental Status: She is alert and oriented to person, place, and time.  Psychiatric:        Mood and Affect: Mood normal.        Behavior: Behavior normal.     UC Treatments / Results  Labs (all labs ordered are listed, but only abnormal results are displayed) Labs Reviewed  URINE CULTURE  POCT URINALYSIS DIPSTICK, ED / UC  POCT URINALYSIS DIPSTICK, ED / UC    EKG   Radiology No results found.  Procedures Procedures (including critical care time)  Medications Ordered in UC Medications - No data to display  Initial Impression / Assessment and Plan / UC Course  I have reviewed the triage vital signs and the nursing notes.  Pertinent labs & imaging results that were available during my care of the patient were reviewed by me and considered in my medical decision making (see chart for details).     This patient  is a 50 year old female presenting with viral gastroenteritis. Today this pt is afebrile nontachycardic  nontachypneic. No abd pain or CVAT. History pyelo 2021, pt is followed by urology and takes daily trimethoprim.   UA wnl, culture sent.  Suspect dysuria is related to mild dehydration from viral gastroenteritis.  Reassurance provided, rec good hydration.  Zofran sent.  Follow-up with urology as scheduled on 6/16.  Strict ED return precautions discussed. Patient verbalizes understanding and agreement.    Final Clinical Impressions(s) / UC Diagnoses   Final diagnoses:  Viral gastroenteritis  History of acute pyelonephritis     Discharge Instructions      -Take the Zofran (ondansetron) up to 3 times daily for nausea and vomiting. Dissolve one pill under your tongue or between your teeth and your cheek. -Drink plenty of fluids and eat a bland diet as tolerated -Follow-up with your urologist as scheduled on 6/16 -Continue daily trimethoprim -Seek additional immediate medical attention if you develop new symptoms like abdominal pain in 1 place, back pain, new fevers and chills, worsening of the urinary symptoms like burning, frequency, urgency.     ED Prescriptions     Medication Sig Dispense Auth. Provider   ondansetron (ZOFRAN ODT) 8 MG disintegrating tablet Take 1 tablet (8 mg total) by mouth every 8 (eight) hours as needed for nausea or vomiting. 20 tablet Rhys Martini, PA-C      PDMP not reviewed this encounter.   Rhys Martini, PA-C 05/17/21 1627

## 2021-05-17 NOTE — Discharge Instructions (Addendum)
-  Take the Zofran (ondansetron) up to 3 times daily for nausea and vomiting. Dissolve one pill under your tongue or between your teeth and your cheek. -Drink plenty of fluids and eat a bland diet as tolerated -Follow-up with your urologist as scheduled on 6/16 -Continue daily trimethoprim -Seek additional immediate medical attention if you develop new symptoms like abdominal pain in 1 place, back pain, new fevers and chills, worsening of the urinary symptoms like burning, frequency, urgency.

## 2021-05-17 NOTE — ED Triage Notes (Signed)
Patient presents to [facility] for evaluation after having approximately 7 episodes of emesis last night.  This morning felt okay, then had a few more episodes late morning.  States she had a UTI and was septic last year, had similar symptoms, except she doesn't have a fever this time.  Patient c/o some mild burning with urination.    Patient specifically requesting to not be placed on Cipro

## 2021-05-19 LAB — URINE CULTURE: Culture: NO GROWTH

## 2021-06-07 ENCOUNTER — Ambulatory Visit: Payer: 59 | Attending: Sports Medicine | Admitting: Physical Therapy

## 2021-06-07 ENCOUNTER — Other Ambulatory Visit: Payer: Self-pay

## 2021-06-07 DIAGNOSIS — M25571 Pain in right ankle and joints of right foot: Secondary | ICD-10-CM | POA: Diagnosis present

## 2021-06-07 DIAGNOSIS — M25671 Stiffness of right ankle, not elsewhere classified: Secondary | ICD-10-CM | POA: Diagnosis present

## 2021-06-07 NOTE — Therapy (Signed)
[facility] [address] [place], [place], [address] Phone: [phone]   Fax:  [phone]  Physical Therapy Treatment  Patient Details  Name: Julia [name] MRN: [id number] Date of Birth: [birth year] Referring Provider (PT): Julia Bong MD   Encounter Date: 06/07/2021   PT End of Session - 06/07/21 1559     Visit Number 8    Number of Visits 12    Date for PT Re-Evaluation 06/21/21    Authorization Type FOTO.    PT Start Time 0232    PT Stop Time 0305    PT Time Calculation (min) 33 min    Activity Tolerance Patient tolerated treatment well    Behavior During Therapy WFL for tasks assessed/performed             Past Medical History:  Diagnosis Date   Complication of anesthesia    I had a reaction to a epidural "broke out"   Condyloma acuminatum of vulva    COVID-19    GERD (gastroesophageal reflux disease)    History of supraventricular tachycardia    HSV-1 infection    HSV-2 infection    HTN (hypertension)    Hyperlipidemia    Type 2 diabetes mellitus (HCC)     Past Surgical History:  Procedure Laterality Date   CARDIOVASCULAR STRESS TEST  06-04-2007   normal nuclear study/  no ischemia/  normal LVF, ef 63%   CESAREAN SECTION  09-06-2005   LAPAROSCOPIC APPENDECTOMY N/A 03/18/2021   Procedure: APPENDECTOMY LAPAROSCOPIC;  Surgeon: Julia Miner, MD;  Location: [facility];  Service: General;  Laterality: N/A;   LASER ABLATION CONDOLAMATA N/A 10/08/2015   Procedure: CO2 LASER ABLATION CONDOLAMATA OF VULVA;  Surgeon: Julia Overlie, MD;  Location: [facility] [place];  Service: Gynecology;  Laterality: N/A;   SUPRAVENTRICULAR TACHYCARDIA ABLATION N/A 02/24/2015   Procedure: SUPRAVENTRICULAR TACHYCARDIA ABLATION;  Surgeon: Julia Maw, MD;  Location: [facility] CATH LAB;  Service: Cardiovascular;  Laterality: N/A;   TRANSTHORACIC ECHOCARDIOGRAM  01-06-2009   normal LVF, ef 55-60%,  trace MR and TR/  normal stress echo     There were no vitals filed for this visit.   Subjective Assessment - 06/07/21 1513     Subjective COVID-19 screen performed prior to patient entering clinic.  Th patient returns to the clinic today doing much better since an injury on the stairs.  In fact, she reports no pain today.  Her right ankle dorsiflexion remains full actively.  She has distal calf tenderness more medial than lateral today.    Pertinent History SVT, DM, h/o right heel pain.    Patient Stated Goals Get back to normal.    Currently in Pain? No/denies                               [facility] Adult PT Treatment/Exercise - 06/07/21 0001       Electrical Stimulation   Electrical Stimulation Location Right distal calf.    Electrical Stimulation Action IFC at 80-150 Hz.    Electrical Stimulation Parameters 40% scan x 20 minutes.    Electrical Stimulation Goals Pain;Edema      Vasopneumatic   Number Minutes Vasopneumatic  20 minutes    Vasopnuematic Location  --   Right ankle.   Vasopneumatic Pressure Low  PT Long Term Goals - 04/19/21 1042       PT LONG TERM GOAL #1   Title Independent with a HEP.    Period Weeks    Status New      PT LONG TERM GOAL #3   Title Walk a community distance without CAM walker/boot with pain not > 3/10.    Time 4    Period Weeks      PT LONG TERM GOAL #4   Title Perform a reciprocating stair gait with one railing.    Time 4    Period Weeks    Status New      PT LONG TERM GOAL #5   Title Perform ADL's with right ankle pain not > 2/10.    Time 4    Status New                   Plan - 06/07/21 1601     Clinical Impression Statement Patient doing much better since her re-injuty to her right calf when she was running up the stairs.  She had no pain at rest but is palpably tender over her right distal calf, medial greater than lateral.  She did well with treatment today.    Personal Factors and  Comorbidities Comorbidity 1;Other    Comorbidities SVT, DM, h/o right heel pain.    Examination-Activity Limitations Other;Locomotion Level    Examination-Participation Restrictions Other    Stability/Clinical Decision Making Stable/Uncomplicated    Rehab Potential Excellent    PT Treatment/Interventions ADLs/Self Care Home Management;Cryotherapy;Electrical Stimulation;Ultrasound;Moist Heat;Iontophoresis 4mg /ml Dexamethasone;Gait training;Stair training;Functional mobility training;Therapeutic activities;Therapeutic exercise;Neuromuscular re-education;Manual techniques;Patient/family education;Passive range of motion;Vasopneumatic Device    Consulted and Agree with Plan of Care Patient             Patient will benefit from skilled therapeutic intervention in order to improve the following deficits and impairments:  Abnormal gait, Pain, Decreased activity tolerance, Decreased range of motion, Increased edema  Visit Diagnosis: Pain in right ankle and joints of right foot     Problem List Patient Active Problem List   Diagnosis Date Noted   Appendicitis 03/18/2021   E coli bacteremia 03/22/2020   Bacteremia due to Gram-negative bacteria 03/20/2020   Hypothyroidism    Pyelonephritis 02/28/2020   Intractable nausea and vomiting 02/28/2020   Nasal sinus congestion 03/15/2018   Subclinical hypothyroidism 08/05/2017   Hyperlipidemia 07/28/2016   Type 2 diabetes mellitus with hyperglycemia, without long-term current use of insulin (HCC) 04/15/2016   Sinus tachycardia 11/12/2015   S/P radiofrequency ablation operation for arrhythmia 02/24/2015   SVT (supraventricular tachycardia) (HCC) 08/13/2014   Palpitations 08/13/2014   Obesity, Class I, BMI 30-34.9 09/30/2013   HTN (hypertension) 08/01/2012    Julia [name], 08/03/2012 Julia [name] 06/07/2021, 4:03 PM  [facility] [address] [place], [place], [place] Phone: [phone]   Fax:   [phone]  Name: Julia [name] MRN: [name] Date of Birth: Oct 28, 1971

## 2021-06-10 ENCOUNTER — Ambulatory Visit: Payer: 59 | Admitting: Physical Therapy

## 2021-06-10 ENCOUNTER — Encounter: Payer: Self-pay | Admitting: Physical Therapy

## 2021-06-10 ENCOUNTER — Other Ambulatory Visit: Payer: Self-pay

## 2021-06-10 DIAGNOSIS — M25571 Pain in right ankle and joints of right foot: Secondary | ICD-10-CM

## 2021-06-10 DIAGNOSIS — M25671 Stiffness of right ankle, not elsewhere classified: Secondary | ICD-10-CM

## 2021-06-10 NOTE — Therapy (Signed)
[facility] [address] [place], [place], [address] Phone: [phone]   Fax:  [phone]  Physical Therapy Treatment  Patient Details  Name: Julia [name] MRN: [id number] Date of Birth: May 08, 1971 Referring Provider (PT): Rodolph Bong MD   Encounter Date: 06/10/2021   PT End of Session - 06/10/21 1046     Visit Number 9    Number of Visits 12    Date for PT Re-Evaluation 06/21/21    Authorization Type FOTO.    PT Start Time [phone]    PT Stop Time 1027   late start to PT   PT Time Calculation (min) 35 min    Activity Tolerance Patient tolerated treatment well    Behavior During Therapy WFL for tasks assessed/performed             Past Medical History:  Diagnosis Date   Complication of anesthesia    I had a reaction to a epidural "broke out"   Condyloma acuminatum of vulva    COVID-19    GERD (gastroesophageal reflux disease)    History of supraventricular tachycardia    HSV-1 infection    HSV-2 infection    HTN (hypertension)    Hyperlipidemia    Type 2 diabetes mellitus (HCC)     Past Surgical History:  Procedure Laterality Date   CARDIOVASCULAR STRESS TEST  06-04-2007   normal nuclear study/  no ischemia/  normal LVF, ef 63%   CESAREAN SECTION  09-06-2005   LAPAROSCOPIC APPENDECTOMY N/A 03/18/2021   Procedure: APPENDECTOMY LAPAROSCOPIC;  Surgeon: Griselda Miner, MD;  Location: [facility];  Service: General;  Laterality: N/A;   LASER ABLATION CONDOLAMATA N/A 10/08/2015   Procedure: CO2 LASER ABLATION CONDOLAMATA OF VULVA;  Surgeon: Richarda Overlie, MD;  Location: [facility];  Service: Gynecology;  Laterality: N/A;   SUPRAVENTRICULAR TACHYCARDIA ABLATION N/A 02/24/2015   Procedure: SUPRAVENTRICULAR TACHYCARDIA ABLATION;  Surgeon: Marinus Maw, MD;  Location: [facility] CATH LAB;  Service: Cardiovascular;  Laterality: N/A;   TRANSTHORACIC ECHOCARDIOGRAM  01-06-2009   normal LVF, ef 55-60%,  trace MR and TR/  normal  stress echo    There were no vitals filed for this visit.   Subjective Assessment - 06/10/21 1045     Subjective COVID-19 screen performed prior to patient entering clinic. Reports a lot of calf tightness and into achilles tendon but stood a lot yesterday. Is in a wedding tomorrow but is wearing a more comfortable shoe without heel.    Pertinent History SVT, DM, h/o right heel pain.    Patient Stated Goals Get back to normal.    Currently in Pain? Other (Comment)   No pain asssessment provided               [facility] PT Assessment - 06/10/21 0001       Assessment   Medical Diagnosis Right foot pain    Referring Provider (PT) Rodolph Bong MD    Onset Date/Surgical Date 01/18/21    Next MD Visit 06/2021                           [facility] Adult PT Treatment/Exercise - 06/10/21 0001       Modalities   Modalities Vasopneumatic      Vasopneumatic   Number Minutes Vasopneumatic  10 minutes    Vasopnuematic Location  Ankle    Vasopneumatic Pressure Low    Vasopneumatic Temperature  34/edema  Manual Therapy   Manual Therapy Myofascial release    Myofascial Release MFR/TPR to R gastroc, achilles, posterior tibialis to reduce tone/TPs                         PT Long Term Goals - 04/19/21 1042       PT LONG TERM GOAL #1   Title Independent with a HEP.    Period Weeks    Status New      PT LONG TERM GOAL #3   Title Walk a community distance without CAM walker/boot with pain not > 3/10.    Time 4    Period Weeks      PT LONG TERM GOAL #4   Title Perform a reciprocating stair gait with one railing.    Time 4    Period Weeks    Status New      PT LONG TERM GOAL #5   Title Perform ADL's with right ankle pain not > 2/10.    Time 4    Status New                   Plan - 06/10/21 1159     Clinical Impression Statement Patient presented in clinic with reports of tightness of R calf. Muscle tightness palpable throughout the R  calf but TPs notable to R posterior tibialis and also lateral head of the gastroc. Edema present in R calf, ankle and foot upon observation. Patient continues to use compression sock but admits to not elevating RLE as recommended. Normal vasopneumatic response noted following removal of the modality.    Personal Factors and Comorbidities Comorbidity 1;Other    Comorbidities SVT, DM, h/o right heel pain.    Examination-Activity Limitations Other;Locomotion Level    Examination-Participation Restrictions Other    Stability/Clinical Decision Making Stable/Uncomplicated    Rehab Potential Excellent    PT Frequency 3x / week    PT Duration 4 weeks    PT Treatment/Interventions ADLs/Self Care Home Management;Cryotherapy;Electrical Stimulation;Ultrasound;Moist Heat;Iontophoresis 4mg /ml Dexamethasone;Gait training;Stair training;Functional mobility training;Therapeutic activities;Therapeutic exercise;Neuromuscular re-education;Manual techniques;Patient/family education;Passive range of motion;Vasopneumatic Device    PT Next Visit Plan Continue with STW of R calf to reduce tone    Consulted and Agree with Plan of Care Patient             Patient will benefit from skilled therapeutic intervention in order to improve the following deficits and impairments:  Abnormal gait, Pain, Decreased activity tolerance, Decreased range of motion, Increased edema  Visit Diagnosis: Pain in right ankle and joints of right foot  Stiffness of right ankle, not elsewhere classified     Problem List Patient Active Problem List   Diagnosis Date Noted   Appendicitis 03/18/2021   E coli bacteremia 03/22/2020   Bacteremia due to Gram-negative bacteria 03/20/2020   Hypothyroidism    Pyelonephritis 02/28/2020   Intractable nausea and vomiting 02/28/2020   Nasal sinus congestion 03/15/2018   Subclinical hypothyroidism 08/05/2017   Hyperlipidemia 07/28/2016   Type 2 diabetes mellitus with hyperglycemia, without  long-term current use of insulin (HCC) 04/15/2016   Sinus tachycardia 11/12/2015   S/P radiofrequency ablation operation for arrhythmia 02/24/2015   SVT (supraventricular tachycardia) (HCC) 08/13/2014   Palpitations 08/13/2014   Obesity, Class I, BMI 30-34.9 09/30/2013   HTN (hypertension) 08/01/2012    08/03/2012, PTA 06/10/2021, 12:29 PM  [facility] [address] [place], [place], [place] Phone: [phone]   Fax:  [phone]  Name: Julia [name] MRN: [id number] Date of Birth: May 17, 1971

## 2021-06-15 ENCOUNTER — Other Ambulatory Visit: Payer: Self-pay

## 2021-06-15 ENCOUNTER — Ambulatory Visit: Payer: 59 | Admitting: Physical Therapy

## 2021-06-15 DIAGNOSIS — M25571 Pain in right ankle and joints of right foot: Secondary | ICD-10-CM | POA: Diagnosis not present

## 2021-06-15 DIAGNOSIS — M25671 Stiffness of right ankle, not elsewhere classified: Secondary | ICD-10-CM

## 2021-06-15 NOTE — Therapy (Signed)
[facility] [address] [place], [place], [address] Phone: [phone]   Fax:  [phone]  Physical Therapy Treatment  Patient Details  Name: Julia [name] MRN: [id number] Date of Birth: 03/26/1971 Referring Provider (PT): Rodolph Bong MD   Encounter Date: 06/15/2021   PT End of Session - 06/15/21 1337     Visit Number 10    Number of Visits 12    Date for PT Re-Evaluation 06/21/21    Authorization Type FOTO.    PT Start Time 0102    PT Stop Time 0151    PT Time Calculation (min) 49 min    Activity Tolerance Patient tolerated treatment well    Behavior During Therapy WFL for tasks assessed/performed             Past Medical History:  Diagnosis Date   Complication of anesthesia    I had a reaction to a epidural "broke out"   Condyloma acuminatum of vulva    COVID-19    GERD (gastroesophageal reflux disease)    History of supraventricular tachycardia    HSV-1 infection    HSV-2 infection    HTN (hypertension)    Hyperlipidemia    Type 2 diabetes mellitus (HCC)     Past Surgical History:  Procedure Laterality Date   CARDIOVASCULAR STRESS TEST  06-04-2007   normal nuclear study/  no ischemia/  normal LVF, ef 63%   CESAREAN SECTION  09-06-2005   LAPAROSCOPIC APPENDECTOMY N/A 03/18/2021   Procedure: APPENDECTOMY LAPAROSCOPIC;  Surgeon: Griselda Miner, MD;  Location: [facility];  Service: General;  Laterality: N/A;   LASER ABLATION CONDOLAMATA N/A 10/08/2015   Procedure: CO2 LASER ABLATION CONDOLAMATA OF VULVA;  Surgeon: Richarda Overlie, MD;  Location: [facility] [place];  Service: Gynecology;  Laterality: N/A;   SUPRAVENTRICULAR TACHYCARDIA ABLATION N/A 02/24/2015   Procedure: SUPRAVENTRICULAR TACHYCARDIA ABLATION;  Surgeon: Marinus Maw, MD;  Location: [facility] C[facility] LAB;  Service: Cardiovascular;  Laterality: N/A;   TRANSTHORACIC ECHOCARDIOGRAM  01-06-2009   normal LVF, ef 55-60%,  trace MR and TR/  normal stress echo     There were no vitals filed for this visit.   Subjective Assessment - 06/15/21 1338     Subjective COVID-19 screen performed prior to patient entering clinic.  Doing better.  Was in a wedding over the weekend.    Pertinent History SVT, DM, h/o right heel pain.    Patient Stated Goals Get back to normal.    Currently in Pain? Yes    Pain Score 4     Pain Location Calf    Pain Orientation Right    Pain Descriptors / Indicators Discomfort    Pain Type Chronic pain    Pain Onset 1 to 4 weeks ago                               [facility] Adult PT Treatment/Exercise - 06/15/21 0001       Ultrasound   Ultrasound Location Affected right calf    Ultrasound Parameters [place] at 1.50 W/CM2 x 12 minutes.      Vasopneumatic   Number Minutes Vasopneumatic  15 minutes    Vasopnuematic Location  --   Right ankle.   Vasopneumatic Pressure Low      Manual Therapy   Soft tissue mobilization In prone:  STW/M x 12 minutes to patient's affected right calf.  PT Long Term Goals - 04/19/21 1042       PT LONG TERM GOAL #1   Title Independent with a HEP.    Period Weeks    Status New      PT LONG TERM GOAL #3   Title Walk a community distance without CAM walker/boot with pain not > 3/10.    Time 4    Period Weeks      PT LONG TERM GOAL #4   Title Perform a reciprocating stair gait with one railing.    Time 4    Period Weeks    Status New      PT LONG TERM GOAL #5   Title Perform ADL's with right ankle pain not > 2/10.    Time 4    Status New                   Plan - 06/15/21 1347     Clinical Impression Statement The patient did well since last treatment and states she was in a weeding last weekend and she did quite well.  Her right calf continues to exhibit tautness to palpation.  She did well with STW/M.    Personal Factors and Comorbidities Comorbidity 1;Other    Comorbidities SVT, DM, h/o right heel pain.     Examination-Activity Limitations Other;Locomotion Level    Examination-Participation Restrictions Other    Stability/Clinical Decision Making Stable/Uncomplicated    Rehab Potential Excellent    PT Frequency 3x / week    PT Duration 4 weeks    PT Treatment/Interventions ADLs/Self Care Home Management;Cryotherapy;Electrical Stimulation;Ultrasound;Moist Heat;Iontophoresis 4mg /ml Dexamethasone;Gait training;Stair training;Functional mobility training;Therapeutic activities;Therapeutic exercise;Neuromuscular re-education;Manual techniques;Patient/family education;Passive range of motion;Vasopneumatic Device    PT Next Visit Plan Continue with STW of R calf to reduce tone    Consulted and Agree with Plan of Care Patient             Patient will benefit from skilled therapeutic intervention in order to improve the following deficits and impairments:  Abnormal gait, Pain, Decreased activity tolerance, Decreased range of motion, Increased edema  Visit Diagnosis: Pain in right ankle and joints of right foot  Stiffness of right ankle, not elsewhere classified     Problem List Patient Active Problem List   Diagnosis Date Noted   Appendicitis 03/18/2021   E coli bacteremia 03/22/2020   Bacteremia due to Gram-negative bacteria 03/20/2020   Hypothyroidism    Pyelonephritis 02/28/2020   Intractable nausea and vomiting 02/28/2020   Nasal sinus congestion 03/15/2018   Subclinical hypothyroidism 08/05/2017   Hyperlipidemia 07/28/2016   Type 2 diabetes mellitus with hyperglycemia, without long-term current use of insulin (HCC) 04/15/2016   Sinus tachycardia 11/12/2015   S/P radiofrequency ablation operation for arrhythmia 02/24/2015   SVT (supraventricular tachycardia) (HCC) 08/13/2014   Palpitations 08/13/2014   Obesity, Class I, BMI 30-34.9 09/30/2013   HTN (hypertension) 08/01/2012    Lawerence Dery, 08/03/2012 MPT 06/15/2021, 2:14 PM  [facility] [address] [place], [place], [place] Phone: [phone]   Fax:  [phone]  Name: Julia [name] MRN: [name] Date of Birth: June 09, 1971

## 2021-06-17 ENCOUNTER — Ambulatory Visit: Payer: 59 | Admitting: Physical Therapy

## 2021-06-17 ENCOUNTER — Other Ambulatory Visit: Payer: Self-pay

## 2021-06-17 ENCOUNTER — Encounter: Payer: Self-pay | Admitting: Physical Therapy

## 2021-06-17 DIAGNOSIS — M25571 Pain in right ankle and joints of right foot: Secondary | ICD-10-CM | POA: Diagnosis not present

## 2021-06-17 DIAGNOSIS — M25671 Stiffness of right ankle, not elsewhere classified: Secondary | ICD-10-CM

## 2021-06-17 NOTE — Therapy (Signed)
[facility] [address] [place], [place], [address] Phone: [phone]   Fax:  [phone]  Physical Therapy Treatment  Patient Details  Name: Julia [name] MRN: [id number] Date of Birth: Jan 18, 1971 Referring Provider (PT): Rodolph Bong MD   Encounter Date: 06/17/2021   PT End of Session - 06/17/21 1116     Visit Number 11    Number of Visits 12    Date for PT Re-Evaluation 06/21/21    Authorization Type FOTO.    PT Start Time 1040    PT Stop Time 1120    PT Time Calculation (min) 40 min    Activity Tolerance Patient tolerated treatment well    Behavior During Therapy WFL for tasks assessed/performed             Past Medical History:  Diagnosis Date   Complication of anesthesia    I had a reaction to a epidural "broke out"   Condyloma acuminatum of vulva    COVID-19    GERD (gastroesophageal reflux disease)    History of supraventricular tachycardia    HSV-1 infection    HSV-2 infection    HTN (hypertension)    Hyperlipidemia    Type 2 diabetes mellitus (HCC)     Past Surgical History:  Procedure Laterality Date   CARDIOVASCULAR STRESS TEST  06-04-2007   normal nuclear study/  no ischemia/  normal LVF, ef 63%   CESAREAN SECTION  09-06-2005   LAPAROSCOPIC APPENDECTOMY N/A 03/18/2021   Procedure: APPENDECTOMY LAPAROSCOPIC;  Surgeon: Griselda Miner, MD;  Location: [facility];  Service: General;  Laterality: N/A;   LASER ABLATION CONDOLAMATA N/A 10/08/2015   Procedure: CO2 LASER ABLATION CONDOLAMATA OF VULVA;  Surgeon: Richarda Overlie, MD;  Location: [facility] [place];  Service: Gynecology;  Laterality: N/A;   SUPRAVENTRICULAR TACHYCARDIA ABLATION N/A 02/24/2015   Procedure: SUPRAVENTRICULAR TACHYCARDIA ABLATION;  Surgeon: Marinus Maw, MD;  Location: [facility] CATH LAB;  Service: Cardiovascular;  Laterality: N/A;   TRANSTHORACIC ECHOCARDIOGRAM  01-06-2009   normal LVF, ef 55-60%,  trace MR and TR/  normal stress echo     There were no vitals filed for this visit.   Subjective Assessment - 06/17/21 1037     Subjective COVID-19 screen performed prior to patient entering clinic. Did well in slight heels over the weekend during the wedding for several hours. Notices that in the mornings she has a "normal looking" ankle but as the day progresses, swelling increased and moves from calf to posterior heel region.    Pertinent History SVT, DM, h/o right heel pain.    Patient Stated Goals Get back to normal.    Currently in Pain? Yes    Pain Score 4     Pain Location Ankle    Pain Orientation Right    Pain Descriptors / Indicators Tightness    Pain Type Chronic pain    Pain Onset 1 to 4 weeks ago    Pain Frequency Constant                [facility] PT Assessment - 06/17/21 0001       Assessment   Medical Diagnosis Right foot pain    Referring Provider (PT) Rodolph Bong MD    Onset Date/Surgical Date 01/18/21    Next MD Visit 06/2021                           [facility] Adult PT Treatment/Exercise -  06/17/21 0001       Modalities   Modalities Vasopneumatic      Vasopneumatic   Number Minutes Vasopneumatic  15 minutes    Vasopnuematic Location  Ankle    Vasopneumatic Pressure Low    Vasopneumatic Temperature  34/edema      Manual Therapy   Manual Therapy Myofascial release    Myofascial Release MFR/TPR to R gastroc, achilles, posterior tibialis to reduce tone/TPs                         PT Long Term Goals - 04/19/21 1042       PT LONG TERM GOAL #1   Title Independent with a HEP.    Period Weeks    Status New      PT LONG TERM GOAL #3   Title Walk a community distance without CAM walker/boot with pain not > 3/10.    Time 4    Period Weeks      PT LONG TERM GOAL #4   Title Perform a reciprocating stair gait with one railing.    Time 4    Period Weeks    Status New      PT LONG TERM GOAL #5   Title Perform ADL's with right ankle pain not > 2/10.     Time 4    Status New                   Plan - 06/17/21 1119     Clinical Impression Statement Patient presented in clinic with tightness of the R calf and continued swelling. Patient continues to use compression sock to control swelling and tries to use tennis shoes for working. Deep TPs noted throughout calf musculature and into achilles tendon. Swelling notable in R foot and toes as she had compression sock donned upon arrival. Normal vasopneumatic response noted following removal of the modality.    Personal Factors and Comorbidities Comorbidity 1;Other    Comorbidities SVT, DM, h/o right heel pain.    Examination-Activity Limitations Other;Locomotion Level    Examination-Participation Restrictions Other    Stability/Clinical Decision Making Stable/Uncomplicated    Rehab Potential Excellent    PT Frequency 3x / week    PT Duration 4 weeks    PT Treatment/Interventions ADLs/Self Care Home Management;Cryotherapy;Electrical Stimulation;Ultrasound;Moist Heat;Iontophoresis 4mg /ml Dexamethasone;Gait training;Stair training;Functional mobility training;Therapeutic activities;Therapeutic exercise;Neuromuscular re-education;Manual techniques;Patient/family education;Passive range of motion;Vasopneumatic Device    PT Next Visit Plan Continue with STW of R calf to reduce tone    Consulted and Agree with Plan of Care Patient             Patient will benefit from skilled therapeutic intervention in order to improve the following deficits and impairments:  Abnormal gait, Pain, Decreased activity tolerance, Decreased range of motion, Increased edema  Visit Diagnosis: Pain in right ankle and joints of right foot  Stiffness of right ankle, not elsewhere classified     Problem List Patient Active Problem List   Diagnosis Date Noted   Appendicitis 03/18/2021   E coli bacteremia 03/22/2020   Bacteremia due to Gram-negative bacteria 03/20/2020   Hypothyroidism    Pyelonephritis  02/28/2020   Intractable nausea and vomiting 02/28/2020   Nasal sinus congestion 03/15/2018   Subclinical hypothyroidism 08/05/2017   Hyperlipidemia 07/28/2016   Type 2 diabetes mellitus with hyperglycemia, without long-term current use of insulin (HCC) 04/15/2016   Sinus tachycardia 11/12/2015   S/P radiofrequency ablation operation for arrhythmia 02/24/2015  SVT (supraventricular tachycardia) (HCC) 08/13/2014   Palpitations 08/13/2014   Obesity, Class I, BMI 30-34.9 09/30/2013   HTN (hypertension) 08/01/2012    Marvell Fuller, PTA 06/17/2021, 11:35 AM  [facility] [address] [place], [place], [address] Phone: [phone]   Fax:  [phone]  Name: Julia [name] MRN: [id number] Date of Birth: 02-21-1971

## 2021-10-11 ENCOUNTER — Other Ambulatory Visit: Payer: Self-pay | Admitting: Obstetrics and Gynecology

## 2021-10-11 DIAGNOSIS — Z1231 Encounter for screening mammogram for malignant neoplasm of breast: Secondary | ICD-10-CM

## 2021-11-03 ENCOUNTER — Encounter: Payer: Self-pay | Admitting: Physical Therapy

## 2021-11-03 ENCOUNTER — Other Ambulatory Visit: Payer: Self-pay

## 2021-11-03 ENCOUNTER — Ambulatory Visit: Payer: 59 | Attending: Sports Medicine | Admitting: Physical Therapy

## 2021-11-03 DIAGNOSIS — M25671 Stiffness of right ankle, not elsewhere classified: Secondary | ICD-10-CM | POA: Insufficient documentation

## 2021-11-03 DIAGNOSIS — M79674 Pain in right toe(s): Secondary | ICD-10-CM | POA: Insufficient documentation

## 2021-11-03 DIAGNOSIS — M25571 Pain in right ankle and joints of right foot: Secondary | ICD-10-CM | POA: Insufficient documentation

## 2021-11-03 NOTE — Therapy (Signed)
[facility] [address] [place], [place], [address] Phone: [phone]   Fax:  [phone]  Physical Therapy Evaluation  Patient Details  Name: Julia [name] MRN: [id number] Date of Birth: [birth year] Referring Provider (PT): Rodolph Bong MD   Encounter Date: 11/03/2021   PT End of Session - 11/03/21 1513     Visit Number 1    Number of Visits 8    Date for PT Re-Evaluation 12/01/21    PT Start Time 0230    PT Stop Time 0253    PT Time Calculation (min) 23 min    Activity Tolerance Patient tolerated treatment well    Behavior During Therapy [facility] for tasks assessed/performed             Past Medical History:  Diagnosis Date   Complication of anesthesia    I had a reaction to a epidural "broke out"   Condyloma acuminatum of vulva    COVID-19    GERD (gastroesophageal reflux disease)    History of supraventricular tachycardia    HSV-1 infection    HSV-2 infection    HTN (hypertension)    Hyperlipidemia    Type 2 diabetes mellitus (HCC)     Past Surgical History:  Procedure Laterality Date   CARDIOVASCULAR STRESS TEST  06-04-2007   normal nuclear study/  no ischemia/  normal LVF, ef 63%   CESAREAN SECTION  09-06-2005   LAPAROSCOPIC APPENDECTOMY N/A 03/18/2021   Procedure: APPENDECTOMY LAPAROSCOPIC;  Surgeon: Griselda Miner, MD;  Location: [facility];  Service: General;  Laterality: N/A;   LASER ABLATION CONDOLAMATA N/A 10/08/2015   Procedure: CO2 LASER ABLATION CONDOLAMATA OF VULVA;  Surgeon: Richarda Overlie, MD;  Location: [facility];  Service: Gynecology;  Laterality: N/A;   SUPRAVENTRICULAR TACHYCARDIA ABLATION N/A 02/24/2015   Procedure: SUPRAVENTRICULAR TACHYCARDIA ABLATION;  Surgeon: Marinus Maw, MD;  Location: [facility] CATH LAB;  Service: Cardiovascular;  Laterality: N/A;   TRANSTHORACIC ECHOCARDIOGRAM  01-06-2009   normal LVF, ef 55-60%,  trace MR and TR/  normal stress echo    There were no vitals filed  for this visit.    Subjective Assessment - 11/03/21 1514     Subjective COVID-19 screen performed prior to patient entering clinic.  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  Her CC is that of her right great toe feeling stiff and bending down.  This is likely due to long periods of foot and ankle immobility due to her ongoing Achilles problems.  She is not reporting any pain today.  She reports her right calf feels stiff.  She has a bandaid over her right toe as she has been seeing a podiatrist for a toenail fungus.    Pertinent History SVT, DM, h/o right heel pain.    Patient Stated Goals Walk normal and restore range of motion in toe.    Currently in Pain? No/denies                [facility] PT Assessment - 11/03/21 0001       Assessment   Medical Diagnosis Acquired right hallux rigidus    Referring Provider (PT) Rodolph Bong MD    Onset Date/Surgical Date 01/18/21      Precautions   Precautions None      Restrictions   Weight Bearing Restrictions No      Balance Screen   Has the patient fallen in the past 6  months No    Has the patient had a decrease in activity level because of a fear of falling?  No    Is the patient reluctant to leave their home because of a fear of falling?  No      Home Environment   Living Environment Private residence      Observation/Other Assessments   Observations Nodule/prominence on right Achilles      ROM / Strength   AROM / PROM / Strength AROM;Strength      AROM   Overall AROM Comments Active right ankle dorsiflexion with knee in full extension is 10 degrees and knee flexed to 15 degrees of flexion  Her right great toe IP is fixed in 15 degrees of flexion.  MTP is normal but the joint is swollen.      Palpation   Palpation comment Mild right Achilles tenderness.      Ambulation/Gait   Gait Comments Gait is cautious but essentially normal                         Objective measurements completed on examination: See above findings.                     PT Long Term Goals - 11/03/21 1642       PT LONG TERM GOAL #1   Title Independent with a HEP.    Time 4    Period Weeks    Status New      PT LONG TERM GOAL #2   Title Restore normal right great toe IP range of motion.    Time 4    Period Weeks    Status New      PT LONG TERM GOAL #3   Title Walk a community distance with pain not > 2./10.    Time 4    Period Weeks    Status New                    Plan - 11/03/21 1550     Clinical Impression Statement The patient presents to OPPT with a diagnosis right hallux ridigus  and achilles tendinosis.  She has been doing ankle exercises and her ankle range of motion is quite good.  However, her right great toe IP is fixed in 15 degrees of flexion very very little available motion.  Her MTPJ motion is good but this joint is swollen.  She has been seeing a podiatrist for a toe fungus and her toe had a bandaid donned.  She had some mild palpable right Achilles tenderness today.  Her gait is cautios in nature but essentially normal.  Patient will benefit from skilled physical therapy intervention to address pain and deficits.    Personal Factors and Comorbidities Comorbidity 1;Other    Comorbidities SVT, DM, h/o right heel pain.    Stability/Clinical Decision Making Stable/Uncomplicated    Clinical Decision Making Low    Rehab Potential Good    PT Frequency 2x / week    PT Duration 4 weeks    PT Treatment/Interventions ADLs/Self Care Home Management;Ultrasound;Therapeutic activities;Therapeutic exercise;Neuromuscular re-education;Manual techniques;Patient/family education;Passive range of motion    PT Next Visit Plan STW/M to right calf and Achilles tendon, right great toe range of motion.    Consulted and Agree with Plan of Care Patient             Patient will benefit from skilled  therapeutic intervention in order  to improve the following deficits and impairments:  Abnormal gait, Pain, Decreased activity tolerance, Decreased range of motion, Increased edema  Visit Diagnosis: Pain in right ankle and joints of right foot - Plan: PT plan of care cert/re-cert  Pain in right toe(s) - Plan: PT plan of care cert/re-cert     Problem List Patient Active Problem List   Diagnosis Date Noted   Appendicitis 03/18/2021   E coli bacteremia 03/22/2020   Bacteremia due to Gram-negative bacteria 03/20/2020   Hypothyroidism    Pyelonephritis 02/28/2020   Intractable nausea and vomiting 02/28/2020   Nasal sinus congestion 03/15/2018   Subclinical hypothyroidism 08/05/2017   Hyperlipidemia 07/28/2016   Type 2 diabetes mellitus with hyperglycemia, without long-term current use of insulin (HCC) 04/15/2016   Sinus tachycardia 11/12/2015   S/P radiofrequency ablation operation for arrhythmia 02/24/2015   SVT (supraventricular tachycardia) (HCC) 08/13/2014   Palpitations 08/13/2014   Obesity, Class I, BMI 30-34.9 09/30/2013   HTN (hypertension) 08/01/2012    Jarick Harkins, [place], PT 11/03/2021, 5:38 PM  [facility] [address] [place], [place], [address] Phone: [phone]   Fax:  [phone]  Name: Julia [name] MRN: [id number] Date of Birth: 1971/03/22

## 2021-11-08 ENCOUNTER — Ambulatory Visit: Payer: 59 | Admitting: Physical Therapy

## 2021-11-08 ENCOUNTER — Other Ambulatory Visit: Payer: Self-pay

## 2021-11-08 DIAGNOSIS — M25571 Pain in right ankle and joints of right foot: Secondary | ICD-10-CM | POA: Diagnosis not present

## 2021-11-08 DIAGNOSIS — M79674 Pain in right toe(s): Secondary | ICD-10-CM

## 2021-11-08 NOTE — Therapy (Signed)
[facility] [address] [place], [place], [address] Phone: [phone]   Fax:  [phone]  Physical Therapy Treatment  Patient Details  Name: Julia [name] MRN: [id number] Date of Birth: August 29, 1971 Referring Provider (PT): Rodolph Bong MD   Encounter Date: 11/08/2021   PT End of Session - 11/08/21 1727     Visit Number 2    Number of Visits 8    Date for PT Re-Evaluation 12/01/21    Authorization Type FOTO.    PT Start Time 0105    PT Stop Time 0146    PT Time Calculation (min) 41 min    Activity Tolerance Patient tolerated treatment well    Behavior During Therapy WFL for tasks assessed/performed             Past Medical History:  Diagnosis Date   Complication of anesthesia    I had a reaction to a epidural "broke out"   Condyloma acuminatum of vulva    COVID-19    GERD (gastroesophageal reflux disease)    History of supraventricular tachycardia    HSV-1 infection    HSV-2 infection    HTN (hypertension)    Hyperlipidemia    Type 2 diabetes mellitus (HCC)     Past Surgical History:  Procedure Laterality Date   CARDIOVASCULAR STRESS TEST  06-04-2007   normal nuclear study/  no ischemia/  normal LVF, ef 63%   CESAREAN SECTION  09-06-2005   LAPAROSCOPIC APPENDECTOMY N/A 03/18/2021   Procedure: APPENDECTOMY LAPAROSCOPIC;  Surgeon: Griselda Miner, MD;  Location: [facility];  Service: General;  Laterality: N/A;   LASER ABLATION CONDOLAMATA N/A 10/08/2015   Procedure: CO2 LASER ABLATION CONDOLAMATA OF VULVA;  Surgeon: Richarda Overlie, MD;  Location: [facility];  Service: Gynecology;  Laterality: N/A;   SUPRAVENTRICULAR TACHYCARDIA ABLATION N/A 02/24/2015   Procedure: SUPRAVENTRICULAR TACHYCARDIA ABLATION;  Surgeon: Marinus Maw, MD;  Location: [facility] CATH LAB;  Service: Cardiovascular;  Laterality: N/A;   TRANSTHORACIC ECHOCARDIOGRAM  01-06-2009   normal LVF, ef 55-60%,  trace MR and TR/  normal stress echo     There were no vitals filed for this visit.   Subjective Assessment - 11/08/21 1725     Subjective COVID-19 screen performed prior to patient entering clinic.  Doing okay.    Patient Stated Goals Walk normal and restore range of motion in toe.    Currently in Pain? Yes    Pain Score 2     Pain Location Ankle    Pain Orientation Right    Pain Descriptors / Indicators Tightness    Pain Onset 1 to 4 weeks ago                               [facility] Adult PT Treatment/Exercise - 11/08/21 0001       Moist Heat Therapy   Number Minutes Moist Heat 15 Minutes    Moist Heat Location --   Right calf.     Manual Therapy   Manual Therapy Passive ROM    Passive ROM Right great toe range of motion/stretching and STW/M to patient's right calf and Achilles tendon (23 minutes total).                          PT Long Term Goals - 11/03/21 1642       PT LONG TERM GOAL #1  Title Independent with a HEP.    Time 4    Period Weeks    Status New      PT LONG TERM GOAL #2   Title Restore normal right great toe IP range of motion.    Time 4    Period Weeks    Status New      PT LONG TERM GOAL #3   Title Walk a community distance with pain not > 2./10.    Time 4    Period Weeks    Status New                   Plan - 11/08/21 1727     Clinical Impression Statement Patient did well with PT.  Trigger point noted in right medial proximal calf that responded well to STW/M    Personal Factors and Comorbidities Comorbidity 1;Other    Comorbidities SVT, DM, h/o right heel pain.    Examination-Activity Limitations Other;Locomotion Level    Examination-Participation Restrictions Other    Stability/Clinical Decision Making Stable/Uncomplicated    Rehab Potential Good    PT Frequency 2x / week    PT Duration 4 weeks    PT Treatment/Interventions ADLs/Self Care Home Management;Ultrasound;Therapeutic activities;Therapeutic exercise;Neuromuscular  re-education;Manual techniques;Patient/family education;Passive range of motion    PT Next Visit Plan STW/M to right calf and Achilles tendon, right great toe range of motion.    Consulted and Agree with Plan of Care Patient             Patient will benefit from skilled therapeutic intervention in order to improve the following deficits and impairments:  Abnormal gait, Pain, Decreased activity tolerance, Decreased range of motion, Increased edema  Visit Diagnosis: Pain in right ankle and joints of right foot  Pain in right toe(s)     Problem List Patient Active Problem List   Diagnosis Date Noted   Appendicitis 03/18/2021   E coli bacteremia 03/22/2020   Bacteremia due to Gram-negative bacteria 03/20/2020   Hypothyroidism    Pyelonephritis 02/28/2020   Intractable nausea and vomiting 02/28/2020   Nasal sinus congestion 03/15/2018   Subclinical hypothyroidism 08/05/2017   Hyperlipidemia 07/28/2016   Type 2 diabetes mellitus with hyperglycemia, without long-term current use of insulin (HCC) 04/15/2016   Sinus tachycardia 11/12/2015   S/P radiofrequency ablation operation for arrhythmia 02/24/2015   SVT (supraventricular tachycardia) (HCC) 08/13/2014   Palpitations 08/13/2014   Obesity, Class I, BMI 30-34.9 09/30/2013   HTN (hypertension) 08/01/2012    Farouk Vivero, [place], PT 11/08/2021, 5:29 PM  [facility] [address] [place], [place], [address] Phone: [phone]   Fax:  [phone]  Name: TELIAH BUFFALO MRN: [id number] Date of Birth: 03/03/1971

## 2021-11-10 ENCOUNTER — Ambulatory Visit: Payer: 59 | Admitting: *Deleted

## 2021-11-10 ENCOUNTER — Other Ambulatory Visit: Payer: Self-pay

## 2021-11-10 DIAGNOSIS — M25571 Pain in right ankle and joints of right foot: Secondary | ICD-10-CM | POA: Diagnosis not present

## 2021-11-10 DIAGNOSIS — M79674 Pain in right toe(s): Secondary | ICD-10-CM

## 2021-11-10 NOTE — Therapy (Signed)
[facility] [address] [place], [place], [address] Phone: [phone]   Fax:  [phone]  Physical Therapy Treatment  Patient Details  Name: Julia [name] MRN: [id number] Date of Birth: [birth year] Referring Provider (PT): Rodolph Bong MD   Encounter Date: 11/10/2021   PT End of Session - 11/10/21 1408     Visit Number 3    Number of Visits 8    Date for PT Re-Evaluation 12/01/21    Authorization Type FOTO.    PT Start Time 1300    PT Stop Time 1347    PT Time Calculation (min) 47 min             Past Medical History:  Diagnosis Date   Complication of anesthesia    I had a reaction to a epidural "broke out"   Condyloma acuminatum of vulva    COVID-19    GERD (gastroesophageal reflux disease)    History of supraventricular tachycardia    HSV-1 infection    HSV-2 infection    HTN (hypertension)    Hyperlipidemia    Type 2 diabetes mellitus (HCC)     Past Surgical History:  Procedure Laterality Date   CARDIOVASCULAR STRESS TEST  06-04-2007   normal nuclear study/  no ischemia/  normal LVF, ef 63%   CESAREAN SECTION  09-06-2005   LAPAROSCOPIC APPENDECTOMY N/A 03/18/2021   Procedure: APPENDECTOMY LAPAROSCOPIC;  Surgeon: Griselda Miner, MD;  Location: [facility];  Service: General;  Laterality: N/A;   LASER ABLATION CONDOLAMATA N/A 10/08/2015   Procedure: CO2 LASER ABLATION CONDOLAMATA OF VULVA;  Surgeon: Richarda Overlie, MD;  Location: [facility];  Service: Gynecology;  Laterality: N/A;   SUPRAVENTRICULAR TACHYCARDIA ABLATION N/A 02/24/2015   Procedure: SUPRAVENTRICULAR TACHYCARDIA ABLATION;  Surgeon: Marinus Maw, MD;  Location: [facility] CATH LAB;  Service: Cardiovascular;  Laterality: N/A;   TRANSTHORACIC ECHOCARDIOGRAM  01-06-2009   normal LVF, ef 55-60%,  trace MR and TR/  normal stress echo    There were no vitals filed for this visit.   Subjective Assessment - 11/10/21 1303     Subjective COVID-19 screen  performed prior to patient entering clinic.  Doing okay.    Pertinent History SVT, DM, h/o right heel pain.    Currently in Pain? Yes    Pain Score 2     Pain Location Ankle    Pain Orientation Right    Pain Descriptors / Indicators Tightness    Pain Type Chronic pain                               [facility] Adult PT Treatment/Exercise - 11/10/21 0001       Manual Therapy   Manual Therapy Passive ROM    Passive ROM Right great toe range of motion/stretching and STW/M, IASTM to patient's right calf and Achilles tendon (30 minutes total).      Ankle Exercises: Seated   Heel Raises Right;20 reps    Other Seated Ankle Exercises great toe raise x10, 2-4 digit raises with Great toe down x 10                          PT Long Term Goals - 11/03/21 1642       PT LONG TERM GOAL #1   Title Independent with a HEP.    Time 4    Period Weeks  Status New      PT LONG TERM GOAL #2   Title Restore normal right great toe IP range of motion.    Time 4    Period Weeks    Status New      PT LONG TERM GOAL #3   Title Walk a community distance with pain not > 2./10.    Time 4    Period Weeks    Status New                   Plan - 11/10/21 1356     Clinical Impression Statement Pt arrived today doing better as per Pt. Rx focused on ROM of great toe and ankle as well as exs for mm activation for GT extension. Pt felt good after Rx    Personal Factors and Comorbidities Comorbidity 1;Other    Comorbidities SVT, DM, h/o right heel pain.    Examination-Activity Limitations Other;Locomotion Level    Stability/Clinical Decision Making Stable/Uncomplicated    Rehab Potential Good    PT Frequency 2x / week    PT Duration 4 weeks    PT Treatment/Interventions ADLs/Self Care Home Management;Ultrasound;Therapeutic activities;Therapeutic exercise;Neuromuscular re-education;Manual techniques;Patient/family education;Passive range of motion    PT Next  Visit Plan STW/M to right calf and Achilles tendon, right great toe range of motion.    Consulted and Agree with Plan of Care Patient             Patient will benefit from skilled therapeutic intervention in order to improve the following deficits and impairments:  Abnormal gait, Pain, Decreased activity tolerance, Decreased range of motion, Increased edema  Visit Diagnosis: Pain in right ankle and joints of right foot  Pain in right toe(s)     Problem List Patient Active Problem List   Diagnosis Date Noted   Appendicitis 03/18/2021   E coli bacteremia 03/22/2020   Bacteremia due to Gram-negative bacteria 03/20/2020   Hypothyroidism    Pyelonephritis 02/28/2020   Intractable nausea and vomiting 02/28/2020   Nasal sinus congestion 03/15/2018   Subclinical hypothyroidism 08/05/2017   Hyperlipidemia 07/28/2016   Type 2 diabetes mellitus with hyperglycemia, without long-term current use of insulin (HCC) 04/15/2016   Sinus tachycardia 11/12/2015   S/P radiofrequency ablation operation for arrhythmia 02/24/2015   SVT (supraventricular tachycardia) (HCC) 08/13/2014   Palpitations 08/13/2014   Obesity, Class I, BMI 30-34.9 09/30/2013   HTN (hypertension) 08/01/2012    Skarlett Sedlacek,CHRIS, PTA 11/10/2021, 2:10 PM  [facility] [address] [place], [place], [address] Phone: [phone]   Fax:  [phone]  Name: Julia [name] MRN: [id number] Date of Birth: March 15, 1971

## 2021-11-15 ENCOUNTER — Encounter: Payer: 59 | Admitting: Physical Therapy

## 2021-11-18 ENCOUNTER — Ambulatory Visit: Payer: 59 | Admitting: *Deleted

## 2021-11-21 ENCOUNTER — Ambulatory Visit: Payer: 59 | Admitting: Physical Therapy

## 2021-11-21 ENCOUNTER — Other Ambulatory Visit: Payer: Self-pay

## 2021-11-21 ENCOUNTER — Encounter: Payer: Self-pay | Admitting: Physical Therapy

## 2021-11-21 DIAGNOSIS — M25571 Pain in right ankle and joints of right foot: Secondary | ICD-10-CM

## 2021-11-21 DIAGNOSIS — M79674 Pain in right toe(s): Secondary | ICD-10-CM

## 2021-11-21 DIAGNOSIS — M25671 Stiffness of right ankle, not elsewhere classified: Secondary | ICD-10-CM

## 2021-11-21 NOTE — Therapy (Signed)
[facility] [address] [place], [place], [address] Phone: [phone]   Fax:  [phone]  Physical Therapy Treatment  Patient Details  Name: Julia [name] MRN: [id number] Date of Birth: August 18, 1971 Referring Provider (PT): Rodolph Bong MD   Encounter Date: 11/21/2021   PT End of Session - 11/21/21 0735     Visit Number 4    Number of Visits 8    Date for PT Re-Evaluation 12/01/21    Authorization Type FOTO.    PT Start Time [phone]    PT Stop Time 0815    PT Time Calculation (min) 40 min    Activity Tolerance Patient tolerated treatment well    Behavior During Therapy WFL for tasks assessed/performed             Past Medical History:  Diagnosis Date   Complication of anesthesia    I had a reaction to a epidural "broke out"   Condyloma acuminatum of vulva    COVID-19    GERD (gastroesophageal reflux disease)    History of supraventricular tachycardia    HSV-1 infection    HSV-2 infection    HTN (hypertension)    Hyperlipidemia    Type 2 diabetes mellitus (HCC)     Past Surgical History:  Procedure Laterality Date   CARDIOVASCULAR STRESS TEST  06-04-2007   normal nuclear study/  no ischemia/  normal LVF, ef 63%   CESAREAN SECTION  09-06-2005   LAPAROSCOPIC APPENDECTOMY N/A 03/18/2021   Procedure: APPENDECTOMY LAPAROSCOPIC;  Surgeon: Griselda Miner, MD;  Location: [facility];  Service: General;  Laterality: N/A;   LASER ABLATION CONDOLAMATA N/A 10/08/2015   Procedure: CO2 LASER ABLATION CONDOLAMATA OF VULVA;  Surgeon: Richarda Overlie, MD;  Location: [facility];  Service: Gynecology;  Laterality: N/A;   SUPRAVENTRICULAR TACHYCARDIA ABLATION N/A 02/24/2015   Procedure: SUPRAVENTRICULAR TACHYCARDIA ABLATION;  Surgeon: Marinus Maw, MD;  Location: [facility] CATH LAB;  Service: Cardiovascular;  Laterality: N/A;   TRANSTHORACIC ECHOCARDIOGRAM  01-06-2009   normal LVF, ef 55-60%,  trace MR and TR/  normal stress echo     There were no vitals filed for this visit.   Subjective Assessment - 11/21/21 0733     Subjective COVID-19 screen performed prior to patient entering clinic. May need to come back for strengthening next year but more stretching.    Pertinent History SVT, DM, h/o right heel pain.    Patient Stated Goals Walk normal and restore range of motion in toe.    Currently in Pain? No/denies                [facility] PT Assessment - 11/21/21 0001       Assessment   Medical Diagnosis Acquired right hallux rigidus    Referring Provider (PT) Rodolph Bong MD    Onset Date/Surgical Date 01/18/21      Precautions   Precautions None      Restrictions   Weight Bearing Restrictions No                           [facility] Adult PT Treatment/Exercise - 11/21/21 0001       Manual Therapy   Manual Therapy Passive ROM;Myofascial release    Myofascial Release STW/TPR to R calf, PF, great toe musculature to reduce tone and stiffness    Passive ROM Right great toe range of motion/stretching  PT Long Term Goals - 11/03/21 1642       PT LONG TERM GOAL #1   Title Independent with a HEP.    Time 4    Period Weeks    Status New      PT LONG TERM GOAL #2   Title Restore normal right great toe IP range of motion.    Time 4    Period Weeks    Status New      PT LONG TERM GOAL #3   Title Walk a community distance with pain not > 2./10.    Time 4    Period Weeks    Status New                   Plan - 11/21/21 0826     Clinical Impression Statement Patient presented in clinic with reports of continued stiffness of the R great toe. Patient tender to medial head of the gastroc as TP present. Patient observed all techniques as to continue them at home to progress ROM and STW. Bandaid still donned to R great toe due to absence of the nail.    Personal Factors and Comorbidities Comorbidity 1;Other    Comorbidities SVT, DM, h/o  right heel pain.    Examination-Activity Limitations Other;Locomotion Level    Examination-Participation Restrictions Other    Stability/Clinical Decision Making Stable/Uncomplicated    Rehab Potential Good    PT Frequency 2x / week    PT Duration 4 weeks    PT Treatment/Interventions ADLs/Self Care Home Management;Ultrasound;Therapeutic activities;Therapeutic exercise;Neuromuscular re-education;Manual techniques;Patient/family education;Passive range of motion    PT Next Visit Plan STW/M to right calf and Achilles tendon, right great toe range of motion.    Consulted and Agree with Plan of Care Patient             Patient will benefit from skilled therapeutic intervention in order to improve the following deficits and impairments:  Abnormal gait, Pain, Decreased activity tolerance, Decreased range of motion, Increased edema  Visit Diagnosis: Pain in right ankle and joints of right foot  Pain in right toe(s)  Stiffness of right ankle, not elsewhere classified     Problem List Patient Active Problem List   Diagnosis Date Noted   Appendicitis 03/18/2021   E coli bacteremia 03/22/2020   Bacteremia due to Gram-negative bacteria 03/20/2020   Hypothyroidism    Pyelonephritis 02/28/2020   Intractable nausea and vomiting 02/28/2020   Nasal sinus congestion 03/15/2018   Subclinical hypothyroidism 08/05/2017   Hyperlipidemia 07/28/2016   Type 2 diabetes mellitus with hyperglycemia, without long-term current use of insulin (HCC) 04/15/2016   Sinus tachycardia 11/12/2015   S/P radiofrequency ablation operation for arrhythmia 02/24/2015   SVT (supraventricular tachycardia) (HCC) 08/13/2014   Palpitations 08/13/2014   Obesity, Class I, BMI 30-34.9 09/30/2013   HTN (hypertension) 08/01/2012    Marvell Fuller, PTA 11/21/2021, 8:28 AM  [facility] [address] [place], [place], [address] Phone: [phone]   Fax:   [phone]  Name: Julia [name] MRN: [id number] Date of Birth: Feb 05, 1971

## 2021-11-23 ENCOUNTER — Ambulatory Visit
Admission: RE | Admit: 2021-11-23 | Discharge: 2021-11-23 | Disposition: A | Discharge status: Home / Self Care | Payer: 59 | Source: Ambulatory Visit | Attending: Obstetrics and Gynecology | Admitting: Obstetrics and Gynecology

## 2021-11-23 DIAGNOSIS — Z1231 Encounter for screening mammogram for malignant neoplasm of breast: Secondary | ICD-10-CM

## 2021-11-30 ENCOUNTER — Encounter: Payer: Self-pay | Admitting: Physical Therapy

## 2021-11-30 ENCOUNTER — Other Ambulatory Visit: Payer: Self-pay

## 2021-11-30 ENCOUNTER — Ambulatory Visit: Payer: 59 | Admitting: Physical Therapy

## 2021-11-30 DIAGNOSIS — M25571 Pain in right ankle and joints of right foot: Secondary | ICD-10-CM | POA: Diagnosis not present

## 2021-11-30 DIAGNOSIS — M25671 Stiffness of right ankle, not elsewhere classified: Secondary | ICD-10-CM

## 2021-11-30 DIAGNOSIS — M79674 Pain in right toe(s): Secondary | ICD-10-CM

## 2021-11-30 NOTE — Therapy (Signed)
[facility] [address] [place], [place], [address] Phone: [phone]   Fax:  [phone]  Physical Therapy Treatment  Patient Details  Name: Julia [name] MRN: [id number] Date of Birth: [birth year] Referring Provider (PT): Rodolph Bong MD   Encounter Date: 11/30/2021   PT End of Session - 11/30/21 1052     Visit Number 5    Number of Visits 8    Date for PT Re-Evaluation 12/01/21    Authorization Type FOTO.    PT Start Time [phone]   Late arrival.   PT Stop Time 1029    PT Time Calculation (min) 37 min    Activity Tolerance Patient tolerated treatment well    Behavior During Therapy WFL for tasks assessed/performed             Past Medical History:  Diagnosis Date   Complication of anesthesia    I had a reaction to a epidural "broke out"   Condyloma acuminatum of vulva    COVID-19    GERD (gastroesophageal reflux disease)    History of supraventricular tachycardia    HSV-1 infection    HSV-2 infection    HTN (hypertension)    Hyperlipidemia    Type 2 diabetes mellitus (HCC)     Past Surgical History:  Procedure Laterality Date   CARDIOVASCULAR STRESS TEST  06-04-2007   normal nuclear study/  no ischemia/  normal LVF, ef 63%   CESAREAN SECTION  09-06-2005   LAPAROSCOPIC APPENDECTOMY N/A 03/18/2021   Procedure: APPENDECTOMY LAPAROSCOPIC;  Surgeon: Griselda Miner, MD;  Location: [facility];  Service: General;  Laterality: N/A;   LASER ABLATION CONDOLAMATA N/A 10/08/2015   Procedure: CO2 LASER ABLATION CONDOLAMATA OF VULVA;  Surgeon: Richarda Overlie, MD;  Location: [facility];  Service: Gynecology;  Laterality: N/A;   SUPRAVENTRICULAR TACHYCARDIA ABLATION N/A 02/24/2015   Procedure: SUPRAVENTRICULAR TACHYCARDIA ABLATION;  Surgeon: Marinus Maw, MD;  Location: [facility] CATH LAB;  Service: Cardiovascular;  Laterality: N/A;   TRANSTHORACIC ECHOCARDIOGRAM  01-06-2009   normal LVF, ef 55-60%,  trace MR and TR/  normal  stress echo    There were no vitals filed for this visit.   Subjective Assessment - 11/30/21 1052     Subjective COVID-19 screen performed prior to patient entering clinic.  Feeling better and toe looks better.    Pertinent History SVT, DM, h/o right heel pain.    Patient Stated Goals Walk normal and restore range of motion in toe.    Currently in Pain? Yes    Pain Score 2     Pain Location Ankle    Pain Orientation Right    Pain Descriptors / Indicators Tightness    Pain Type Chronic pain                               [facility] Adult PT Treatment/Exercise - 11/30/21 0001       Exercises   Exercises Knee/Hip      Knee/Hip Exercises: Aerobic   Nustep Level 3 x 11 minutes.      Manual Therapy   Manual Therapy Passive ROM;Soft tissue mobilization    Manual therapy comments PROM to patient's right great toe and ankle and STW/M including IASTM to patient's right calf musculature and Achilles tendon (22 minutes total).  PT Long Term Goals - 11/03/21 1642       PT LONG TERM GOAL #1   Title Independent with a HEP.    Time 4    Period Weeks    Status New      PT LONG TERM GOAL #2   Title Restore normal right great toe IP range of motion.    Time 4    Period Weeks    Status New      PT LONG TERM GOAL #3   Title Walk a community distance with pain not > 2./10.    Time 4    Period Weeks    Status New                   Plan - 11/30/21 1058     Clinical Impression Statement Patient did very well today.  Her right great toe extension has improved.  She enjoyed IASTM and found it be be very helpful today.    Personal Factors and Comorbidities Comorbidity 1;Other    Comorbidities SVT, DM, h/o right heel pain.    Examination-Activity Limitations Other;Locomotion Level    Examination-Participation Restrictions Other    Stability/Clinical Decision Making Stable/Uncomplicated    Rehab Potential Good    PT  Frequency 2x / week    PT Duration 4 weeks    PT Treatment/Interventions ADLs/Self Care Home Management;Ultrasound;Therapeutic activities;Therapeutic exercise;Neuromuscular re-education;Manual techniques;Patient/family education;Passive range of motion    PT Next Visit Plan STW/M to right calf and Achilles tendon, right great toe range of motion.    Consulted and Agree with Plan of Care Patient             Patient will benefit from skilled therapeutic intervention in order to improve the following deficits and impairments:  Abnormal gait, Pain, Decreased activity tolerance, Decreased range of motion, Increased edema  Visit Diagnosis: Pain in right ankle and joints of right foot  Pain in right toe(s)  Stiffness of right ankle, not elsewhere classified     Problem List Patient Active Problem List   Diagnosis Date Noted   Appendicitis 03/18/2021   E coli bacteremia 03/22/2020   Bacteremia due to Gram-negative bacteria 03/20/2020   Hypothyroidism    Pyelonephritis 02/28/2020   Intractable nausea and vomiting 02/28/2020   Nasal sinus congestion 03/15/2018   Subclinical hypothyroidism 08/05/2017   Hyperlipidemia 07/28/2016   Type 2 diabetes mellitus with hyperglycemia, without long-term current use of insulin (HCC) 04/15/2016   Sinus tachycardia 11/12/2015   S/P radiofrequency ablation operation for arrhythmia 02/24/2015   SVT (supraventricular tachycardia) (HCC) 08/13/2014   Palpitations 08/13/2014   Obesity, Class I, BMI 30-34.9 09/30/2013   HTN (hypertension) 08/01/2012    Leelynn Whetsel, [place], PT 11/30/2021, 11:00 AM  [facility] [address] [place], [place], [address] Phone: [phone]   Fax:  [phone]  Name: LEEANN BADY MRN: [id number] Date of Birth: Jul 19, 1971

## 2021-12-08 ENCOUNTER — Ambulatory Visit: Payer: 59 | Attending: Sports Medicine | Admitting: Physical Therapy

## 2021-12-08 ENCOUNTER — Other Ambulatory Visit: Payer: Self-pay

## 2021-12-08 ENCOUNTER — Encounter: Payer: Self-pay | Admitting: Physical Therapy

## 2021-12-08 DIAGNOSIS — M25571 Pain in right ankle and joints of right foot: Secondary | ICD-10-CM | POA: Diagnosis present

## 2021-12-08 DIAGNOSIS — M79674 Pain in right toe(s): Secondary | ICD-10-CM | POA: Insufficient documentation

## 2021-12-08 DIAGNOSIS — M25671 Stiffness of right ankle, not elsewhere classified: Secondary | ICD-10-CM | POA: Diagnosis present

## 2021-12-08 NOTE — Therapy (Addendum)
[facility] [place], [place], [address] Phone: [phone]   Fax:  [phone]  Physical Therapy Treatment  Patient Details  Name: Julia [name] MRN: [id number] Date of Birth: [birth year] Referring Provider (PT): Vickki Hearing MD   Encounter Date: 12/08/2021   PT End of Session - 12/08/21 1306     Visit Number 6    Number of Visits 8    Date for PT Re-Evaluation 12/01/21    PT Start Time 6962    PT Stop Time 1345    PT Time Calculation (min) 37 min    Activity Tolerance Patient tolerated treatment well    Behavior During Therapy [facility] for tasks assessed/performed             Past Medical History:  Diagnosis Date   Complication of anesthesia    I had a reaction to a epidural "broke out"   Condyloma acuminatum of vulva    COVID-19    GERD (gastroesophageal reflux disease)    History of supraventricular tachycardia    HSV-1 infection    HSV-2 infection    HTN (hypertension)    Hyperlipidemia    Type 2 diabetes mellitus ([place])     Past Surgical History:  Procedure Laterality Date   CARDIOVASCULAR STRESS TEST  06-04-2007   normal nuclear study/  no ischemia/  normal LVF, ef 63%   CESAREAN SECTION  09-06-2005   LAPAROSCOPIC APPENDECTOMY N/A 03/18/2021   Procedure: APPENDECTOMY LAPAROSCOPIC;  Surgeon: Jovita Kussmaul, MD;  Location: [facility];  Service: General;  Laterality: N/A;   LASER ABLATION CONDOLAMATA N/A 10/08/2015   Procedure: CO2 LASER ABLATION CONDOLAMATA OF VULVA;  Surgeon: Molli Posey, MD;  Location: [place];  Service: Gynecology;  Laterality: N/A;   SUPRAVENTRICULAR TACHYCARDIA ABLATION N/A 02/24/2015   Procedure: SUPRAVENTRICULAR TACHYCARDIA ABLATION;  Surgeon: Evans Lance, MD;  Location: [facility] CATH LAB;  Service: Cardiovascular;  Laterality: N/A;   TRANSTHORACIC ECHOCARDIOGRAM  01-06-2009   normal LVF, ef 55-60%,  trace MR and TR/  normal stress echo    There were no vitals filed for  this visit.   Subjective Assessment - 12/08/21 1305     Subjective COVID-19 screen performed prior to patient entering clinic.  Feels like she is having a flare up of antibiotic poison with burning in her elbows and L ankle.    Pertinent History SVT, DM, h/o right heel pain.    Patient Stated Goals Walk normal and restore range of motion in toe.    Currently in Pain? Yes    Pain Location Ankle    Pain Orientation Left    Pain Descriptors / Indicators Tender    Pain Type Chronic pain    Pain Onset In the past 7 days    Pain Frequency Constant                [facility] PT Assessment - 12/08/21 0001       Assessment   Medical Diagnosis Acquired right hallux rigidus    Referring Provider (PT) Vickki Hearing MD    Onset Date/Surgical Date 01/18/21      Precautions   Precautions None                           [facility] Adult PT Treatment/Exercise - 12/08/21 0001       Knee/Hip Exercises: Aerobic   Nustep L2 x13 min      Manual Therapy  Manual Therapy Soft tissue mobilization    Soft tissue mobilization STW to R achilles, gastroc to reduce pain                          PT Long Term Goals - 11/03/21 1642       PT LONG TERM GOAL #1   Title Independent with a HEP.    Time 4    Period Weeks    Status New      PT LONG TERM GOAL #2   Title Restore normal right great toe IP range of motion.    Time 4    Period Weeks    Status New      PT LONG TERM GOAL #3   Title Walk a community distance with pain not > 2./10.    Time 4    Period Weeks    Status New                   Plan - 12/08/21 1458     Clinical Impression Statement Patient presented in clinic with reports of a possible flare of antibiotic poisoning. Patient has changed her diet and taking vitamin regimen more consistently this week. Patient very tender along achilles and into gastroc muscle belly.    Personal Factors and Comorbidities Comorbidity 1;Other     Comorbidities SVT, DM, h/o right heel pain.    Examination-Activity Limitations Other;Locomotion Level    Examination-Participation Restrictions Other    Stability/Clinical Decision Making Stable/Uncomplicated    Rehab Potential Good    PT Frequency 2x / week    PT Duration 4 weeks    PT Treatment/Interventions ADLs/Self Care Home Management;Ultrasound;Therapeutic activities;Therapeutic exercise;Neuromuscular re-education;Manual techniques;Patient/family education;Passive range of motion    PT Next Visit Plan STW/M to right calf and Achilles tendon, right great toe range of motion.    Consulted and Agree with Plan of Care Patient             Patient will benefit from skilled therapeutic intervention in order to improve the following deficits and impairments:  Abnormal gait, Pain, Decreased activity tolerance, Decreased range of motion, Increased edema  Visit Diagnosis: Pain in right ankle and joints of right foot  Pain in right toe(s)  Stiffness of right ankle, not elsewhere classified     Problem List Patient Active Problem List   Diagnosis Date Noted   Appendicitis 03/18/2021   E coli bacteremia 03/22/2020   Bacteremia due to Gram-negative bacteria 03/20/2020   Hypothyroidism    Pyelonephritis 02/28/2020   Intractable nausea and vomiting 02/28/2020   Nasal sinus congestion 03/15/2018   Subclinical hypothyroidism 08/05/2017   Hyperlipidemia 07/28/2016   Type 2 diabetes mellitus with hyperglycemia, without long-term current use of insulin ([place]) 04/15/2016   Sinus tachycardia 11/12/2015   S/P radiofrequency ablation operation for arrhythmia 02/24/2015   SVT (supraventricular tachycardia) ([place]) 08/13/2014   Palpitations 08/13/2014   Obesity, Class I, BMI 30-34.9 09/30/2013   HTN (hypertension) 08/01/2012    Standley Brooking, PTA 12/08/2021, 3:10 PM  [facility] [address] [place], [place], [address] Phone: [phone]    Fax:  [phone]  Name: VERGENE MARLAND MRN: [id number] Date of Birth: 1971-09-26   PHYSICAL THERAPY DISCHARGE SUMMARY  Visits from Start of Care: 6.  Current functional level related to goals / functional outcomes: See above.   Remaining deficits: Continued pain.   Education / Equipment: HEP.   Patient agrees to discharge.  Patient goals were not met. Patient is being discharged due to lack of progress.    Mali Applegate MPT

## 2021-12-21 ENCOUNTER — Ambulatory Visit: Payer: 59 | Admitting: Physical Therapy

## 2022-03-18 ENCOUNTER — Other Ambulatory Visit: Payer: Self-pay

## 2022-03-18 ENCOUNTER — Ambulatory Visit (HOSPITAL_COMMUNITY)
Admission: EM | Admit: 2022-03-18 | Discharge: 2022-03-18 | Disposition: A | Discharge status: Home / Self Care | Payer: 59 | Attending: Internal Medicine | Admitting: Internal Medicine

## 2022-03-18 ENCOUNTER — Encounter (HOSPITAL_COMMUNITY): Payer: Self-pay | Admitting: Emergency Medicine

## 2022-03-18 DIAGNOSIS — R399 Unspecified symptoms and signs involving the genitourinary system: Secondary | ICD-10-CM

## 2022-03-18 LAB — POCT URINALYSIS DIPSTICK, ED / UC
Bilirubin Urine: NEGATIVE
Glucose, UA: 100 mg/dL — AB
Hgb urine dipstick: NEGATIVE
Ketones, ur: NEGATIVE mg/dL
Nitrite: NEGATIVE
Protein, ur: NEGATIVE mg/dL
Specific Gravity, Urine: 1.01 (ref 1.005–1.030)
Urobilinogen, UA: 0.2 mg/dL (ref 0.0–1.0)
pH: 5 (ref 5.0–8.0)

## 2022-03-18 MED ORDER — AMOXICILLIN-POT CLAVULANATE 875-125 MG PO TABS: 1.0000 | ORAL_TABLET | Freq: Two times a day (BID) | ORAL | 0 refills | Status: AC

## 2022-03-18 NOTE — ED Provider Notes (Signed)
?[place] ? ? ? ?CSN: [id number] ?Arrival date & time: 03/18/22  1602 ? ? ?  ? ?History   ?Chief Complaint ?Chief Complaint  ?Patient presents with  ? Dysuria  ? ? ?HPI ?Julia [name] is a 51 y.o. female.  ? ?The patient is a 51 year old female who presents with urinary symptoms.  Symptoms include dysuria, suprapubic pressure with urination, and frequency.  Patient denies fever, chills, low back pain, hematuria, or vaginal symptoms.  She reports that she currently takes trimethoprim 100 mg daily prophylactically as she had a history of urosepsis over a year ago.  She states she has not had any complications since that time.  She reports that she has not been drinking as much water and that she has been drinking more diet soda. ? ?The history is provided by the patient.  ? ?Past Medical History:  ?Diagnosis Date  ? Complication of anesthesia   ? I had a reaction to a epidural "broke out"  ? Condyloma acuminatum of vulva   ? COVID-19   ? GERD (gastroesophageal reflux disease)   ? History of supraventricular tachycardia   ? HSV-1 infection   ? HSV-2 infection   ? HTN (hypertension)   ? Hyperlipidemia   ? Type 2 diabetes mellitus ([place])   ? ? ?Patient Active Problem List  ? Diagnosis Date Noted  ? Appendicitis 03/18/2021  ? E coli bacteremia 03/22/2020  ? Bacteremia due to Gram-negative bacteria 03/20/2020  ? Hypothyroidism   ? Pyelonephritis 02/28/2020  ? Intractable nausea and vomiting 02/28/2020  ? Nasal sinus congestion 03/15/2018  ? Subclinical hypothyroidism 08/05/2017  ? Hyperlipidemia 07/28/2016  ? Type 2 diabetes mellitus with hyperglycemia, without long-term current use of insulin ([place]) 04/15/2016  ? Sinus tachycardia 11/12/2015  ? S/P radiofrequency ablation operation for arrhythmia 02/24/2015  ? SVT (supraventricular tachycardia) ([place]) 08/13/2014  ? Palpitations 08/13/2014  ? Obesity, Class I, BMI 30-34.9 09/30/2013  ? HTN (hypertension) 08/01/2012  ? ? ?Past Surgical History:  ?Procedure Laterality  Date  ? CARDIOVASCULAR STRESS TEST  06-04-2007  ? normal nuclear study/  no ischemia/  normal LVF, ef 63%  ? CESAREAN SECTION  09-06-2005  ? LAPAROSCOPIC APPENDECTOMY N/A 03/18/2021  ? Procedure: APPENDECTOMY LAPAROSCOPIC;  Surgeon: Jovita Kussmaul, MD;  Location: [facility];  Service: General;  Laterality: N/A;  ? LASER ABLATION CONDOLAMATA N/A 10/08/2015  ? Procedure: CO2 LASER ABLATION CONDOLAMATA OF VULVA;  Surgeon: Molli Posey, MD;  Location: [facility];  Service: Gynecology;  Laterality: N/A;  ? SUPRAVENTRICULAR TACHYCARDIA ABLATION N/A 02/24/2015  ? Procedure: SUPRAVENTRICULAR TACHYCARDIA ABLATION;  Surgeon: Evans Lance, MD;  Location: [facility] CATH LAB;  Service: Cardiovascular;  Laterality: N/A;  ? TRANSTHORACIC ECHOCARDIOGRAM  01-06-2009  ? normal LVF, ef 55-60%,  trace MR and TR/  normal stress echo  ? ? ?OB History   ?No obstetric history on file. ?  ? ? ? ?Home Medications   ? ?Prior to Admission medications   ?Medication Sig Start Date End Date Taking? Authorizing Provider  ?amoxicillin-clavulanate (AUGMENTIN) 875-125 MG tablet Take 1 tablet by mouth every 12 (twelve) hours for 7 days. 03/18/22 03/25/22 Yes Keiondre Colee-Warren, Alda Lea, NP  ?ferrous sulfate 324 MG TBEC Take 324 mg by mouth daily with breakfast.    [provider]  ?furosemide (LASIX) 40 MG tablet Take 20-40 mg by mouth daily as needed for fluid. 03/21/20   [provider]  ?glucose blood (ONE TOUCH ULTRA TEST) test strip USE AS DIRECTED  10/04/17   Carlus Pavlov, MD  ?HYDROcodone-acetaminophen (NORCO/VICODIN) 5-325 MG tablet Take 1-2 tablets by mouth every 6 (six) hours as needed for moderate pain. 03/19/21   Griselda Miner, MD  ?HYDROcodone-acetaminophen (NORCO/VICODIN) 5-325 MG tablet Take 1-2 tablets by mouth every 6 (six) hours as needed for moderate pain or severe pain. 03/19/21   Griselda Miner, MD  ?Insulin Glargine [facility]) 100 UNIT/ML Inject 24-34 Units into the skin at bedtime. 12/16/20    [provider]  ?ipratropium (ATROVENT) 0.06 % nasal spray Place 2 sprays into both nostrils daily as needed for congestion. 02/22/20   [provider]  ?levothyroxine (SYNTHROID) 88 MCG tablet Take 88 mcg by mouth every morning. 12/30/20   [provider]  ?loratadine (CLARITIN) 10 MG tablet Take 10 mg by mouth daily as needed for allergies.    [provider]  ?metFORMIN (GLUCOPHAGE) 1000 MG tablet Take 1,000 mg by mouth 2 (two) times daily with a meal.    [provider]  ?metoprolol succinate (TOPROL-XL) 50 MG 24 hr tablet Take 50 mg by mouth in the morning and at bedtime. 02/06/21   [provider]  ?ondansetron (ZOFRAN ODT) 8 MG disintegrating tablet Take 1 tablet (8 mg total) by mouth every 8 (eight) hours as needed for nausea or vomiting. 05/17/21   Rhys Martini, PA-C  ?pantoprazole (PROTONIX) 40 MG tablet Take 40 mg by mouth 2 (two) times daily. 03/09/21   [provider]  ?propranolol (INDERAL) 10 MG tablet Take 10 mg by mouth 4 (four) times daily as needed (heart papatations).     [provider]  ?trimethoprim (TRIMPEX) 100 MG tablet Take 100 mg by mouth daily. 03/11/21   [provider]  ?vitamin B-12 (CYANOCOBALAMIN) 1000 MCG tablet Take 1,000 mcg by mouth daily.    [provider]  ? ? ?Family History ?Family History  ?Problem Relation Age of Onset  ? Hypertension Mother   ? Thyroid disease Mother   ? Diabetes Mother   ? Thyroid disease Sister   ? Diabetes Maternal Grandmother   ? Hyperlipidemia Other   ? Thyroid disease Other   ? ? ?Social History ?Social History  ? ?Tobacco Use  ? Smoking status: Former  ?  Years: 8.00  ?  Types: Cigarettes  ?  Quit date: 12/16/2002  ?  Years since quitting: 19.2  ? Smokeless tobacco: Never  ?Vaping Use  ? Vaping Use: Never used  ?Substance Use Topics  ? Alcohol use: Yes  ?  Alcohol/week: 2.0 standard drinks  ?  Types: 2 Standard drinks or equivalent per week  ?  Comment: rare  ?  Drug use: No  ? ? ? ?Allergies   ?Ciprofloxacin, Elemental sulfur, Quinolones, Sulfa antibiotics, Trulicity [dulaglutide], Hctz [hydrochlorothiazide], and Invokana [canagliflozin] ? ? ?Review of Systems ?Review of Systems  ?Constitutional: Negative.   ?Gastrointestinal: Negative.   ?     Suprapubic pressure with urination  ?Genitourinary:  Positive for frequency and urgency. Negative for vaginal discharge and vaginal pain.  ?Skin: Negative.   ?Psychiatric/Behavioral: Negative.    ? ? ?Physical Exam ?Triage Vital Signs ?ED Triage Vitals [03/18/22 1637]  ?Enc Vitals Group  ?   BP   ?   Pulse   ?   Resp   ?   Temp   ?   Temp src   ?   SpO2   ?   Weight   ?   Height   ?  Head Circumference   ?   Peak Flow   ?   Pain Score 6  ?   Pain Loc   ?   Pain Edu?   ?   Excl. in [place]?   ? ?No data found. ? ?Updated Vital Signs ?LMP 07/22/2019  ? ?Visual Acuity ?Right Eye Distance:   ?Left Eye Distance:   ?Bilateral Distance:   ? ?Right Eye Near:   ?Left Eye Near:    ?Bilateral Near:    ? ?Physical Exam ?Vitals reviewed.  ?Constitutional:   ?   General: She is not in acute distress. ?   Appearance: Normal appearance.  ?HENT:  ?   Head: Normocephalic.  ?Cardiovascular:  ?   Rate and Rhythm: Normal rate and regular rhythm.  ?   Pulses: Normal pulses.  ?   Heart sounds: Normal heart sounds.  ?Pulmonary:  ?   Effort: Pulmonary effort is normal.  ?   Breath sounds: Normal breath sounds.  ?Abdominal:  ?   General: Bowel sounds are normal.  ?   Palpations: Abdomen is soft.  ?   Tenderness: There is no right CVA tenderness or left CVA tenderness.  ?Skin: ?   General: Skin is warm and dry.  ?   Capillary Refill: Capillary refill takes less than 2 seconds.  ?Neurological:  ?   General: No focal deficit present.  ?   Mental Status: She is alert and oriented to person, place, and time.  ?Psychiatric:     ?   Mood and Affect: Mood normal.     ?   Behavior: Behavior normal.  ? ? ? ?UC Treatments / Results  ?Labs ?(all labs ordered are listed, but  only abnormal results are displayed) ?Labs Reviewed  ?POCT URINALYSIS DIPSTICK, ED / UC - Abnormal; Notable for the following components:  ?    Result Value  ? Glucose, UA 100 (*)   ? Leukocytes,Ua TRACE (*)

## 2022-03-18 NOTE — Discharge Instructions (Addendum)
Your urinalysis does not show that you have a urinary tract infection.  A urine culture has been ordered.  If your culture is negative, you will be contacted and asked to stop the medication. ?Increase fluids.  You should be drinking at least 64 ounces of water daily. ?Void every 2 hours while symptoms persist ?Follow-up if you develop fever, chills, low back pain, fatigue, or other concerns. ?Follow-up with your urologist within the next 5 to 7 days for further evaluation. ?

## 2022-03-18 NOTE — ED Triage Notes (Signed)
Pt reports hx UTIs. Reports frequency and burning with urination since Thursday that has since progressed.  ?

## 2022-11-23 ENCOUNTER — Other Ambulatory Visit: Payer: Self-pay

## 2022-11-23 ENCOUNTER — Ambulatory Visit (INDEPENDENT_AMBULATORY_CARE_PROVIDER_SITE_OTHER): Payer: 59

## 2022-11-23 ENCOUNTER — Encounter (HOSPITAL_COMMUNITY): Payer: Self-pay | Admitting: *Deleted

## 2022-11-23 ENCOUNTER — Ambulatory Visit (HOSPITAL_COMMUNITY)
Admission: EM | Admit: 2022-11-23 | Discharge: 2022-11-23 | Disposition: A | Discharge status: Home / Self Care | Payer: 59 | Attending: Family Medicine | Admitting: Family Medicine

## 2022-11-23 DIAGNOSIS — J189 Pneumonia, unspecified organism: Secondary | ICD-10-CM | POA: Diagnosis not present

## 2022-11-23 DIAGNOSIS — J4521 Mild intermittent asthma with (acute) exacerbation: Secondary | ICD-10-CM | POA: Diagnosis not present

## 2022-11-23 DIAGNOSIS — R0989 Other specified symptoms and signs involving the circulatory and respiratory systems: Secondary | ICD-10-CM | POA: Diagnosis not present

## 2022-11-23 DIAGNOSIS — R059 Cough, unspecified: Secondary | ICD-10-CM

## 2022-11-23 DIAGNOSIS — R062 Wheezing: Secondary | ICD-10-CM

## 2022-11-23 HISTORY — DX: Disorder of thyroid, unspecified: E07.9

## 2022-11-23 MED ORDER — METHYLPREDNISOLONE SODIUM SUCC 125 MG IJ SOLR
INTRAMUSCULAR | Status: AC
  Filled 2022-11-23: qty 2

## 2022-11-23 MED ORDER — ALBUTEROL SULFATE HFA 108 (90 BASE) MCG/ACT IN AERS: 2.0000 | INHALATION_SPRAY | RESPIRATORY_TRACT | 0 refills | Status: DC | PRN

## 2022-11-23 MED ORDER — METHYLPREDNISOLONE SODIUM SUCC 125 MG IJ SOLR
80.0000 mg | Freq: Once | INTRAMUSCULAR | Status: AC
  Administered 2022-11-23: 80 mg via INTRAMUSCULAR

## 2022-11-23 MED ORDER — DOXYCYCLINE HYCLATE 100 MG PO CAPS: 100.0000 mg | ORAL_CAPSULE | Freq: Two times a day (BID) | ORAL | 0 refills | Status: DC

## 2022-11-23 NOTE — Discharge Instructions (Signed)
Your chest x-ray showed some possible early pneumonia.  Take doxycycline 100 mg --1 capsule 2 times daily for 7 days  Albuterol inhaler--do 2 puffs every 4 hours as needed for shortness of breath or wheezing  You have been given a shot of methylprednisolone; check your sugars and use NovoLog as needed as this shot can elevate your sugars.

## 2022-11-23 NOTE — ED Provider Notes (Signed)
[facility]    CSN: [id number] Arrival date & time: 11/23/22  1250      History   Chief Complaint No chief complaint on file.   HPI Julia [name] is a 51 y.o. female.   HPI For sore throat that began on the evening of 18th.  She then developed a lot of malaise and fatigue and aches.  She does not think she has had any fever.  She is then developed some cough and chest congestion and wheezing and sinus pressure yesterday on the 20th.  No nausea or vomiting or diarrhea.  He does have a history of wheezing in has used her albuterol inhaler.  She has diabetes and her sugar was up today in the 300s.  She has used NovoLog and got it better.  Past Medical History:  Diagnosis Date   Complication of anesthesia    I had a reaction to a epidural "broke out"   Condyloma acuminatum of vulva    COVID-19    GERD (gastroesophageal reflux disease)    History of supraventricular tachycardia    HSV-1 infection    HSV-2 infection    HTN (hypertension)    Hyperlipidemia    Thyroid disease    Type 2 diabetes mellitus ([facility])     Patient Active Problem List   Diagnosis Date Noted   Appendicitis 03/18/2021   E coli bacteremia 03/22/2020   Bacteremia due to Gram-negative bacteria 03/20/2020   Hypothyroidism    Pyelonephritis 02/28/2020   Intractable nausea and vomiting 02/28/2020   Nasal sinus congestion 03/15/2018   Subclinical hypothyroidism 08/05/2017   Hyperlipidemia 07/28/2016   Type 2 diabetes mellitus with hyperglycemia, without long-term current use of insulin ([facility]) 04/15/2016   Sinus tachycardia 11/12/2015   S/P radiofrequency ablation operation for arrhythmia 02/24/2015   SVT (supraventricular tachycardia) ([facility]) 08/13/2014   Palpitations 08/13/2014   Obesity, Class I, BMI 30-34.9 09/30/2013   HTN (hypertension) 08/01/2012    Past Surgical History:  Procedure Laterality Date   CARDIOVASCULAR STRESS TEST  06-04-2007   normal nuclear study/  no ischemia/  normal  LVF, ef 63%   CESAREAN SECTION  09-06-2005   LAPAROSCOPIC APPENDECTOMY N/A 03/18/2021   Procedure: APPENDECTOMY LAPAROSCOPIC;  Surgeon: Griselda Miner, MD;  Location: [facility];  Service: General;  Laterality: N/A;   LASER ABLATION CONDOLAMATA N/A 10/08/2015   Procedure: CO2 LASER ABLATION CONDOLAMATA OF VULVA;  Surgeon: Richarda Overlie, MD;  Location: [facility] [place];  Service: Gynecology;  Laterality: N/A;   SUPRAVENTRICULAR TACHYCARDIA ABLATION N/A 02/24/2015   Procedure: SUPRAVENTRICULAR TACHYCARDIA ABLATION;  Surgeon: Marinus Maw, MD;  [facility];  Service: Cardiovascular;  Laterality: N/A;   TRANSTHORACIC ECHOCARDIOGRAM  01-06-2009   normal LVF, ef 55-60%,  trace MR and TR/  normal stress echo    OB History   No obstetric history on file.      Home Medications    Prior to Admission medications   Medication Sig Start Date End Date Taking? Authorizing Provider  albuterol (VENTOLIN HFA) 108 (90 Base) MCG/ACT inhaler Inhale 2 puffs into the lungs every 4 (four) hours as needed for wheezing or shortness of breath. 11/23/22  Yes Zenia Resides, MD  doxycycline (VIBRAMYCIN) 100 MG capsule Take 1 capsule (100 mg total) by mouth 2 (two) times daily for 7 days. 11/23/22 11/30/22 Yes Zenia Resides, MD  ferrous sulfate 324 MG TBEC Take 324 mg by mouth daily with breakfast.   Yes [provider]  insulin aspart (NOVOLOG) 100 UNIT/ML injection Inject 1 Units into the skin 3 (three) times daily before meals. Sliding scale   Yes [provider]  levothyroxine (SYNTHROID) 88 MCG tablet Take 88 mcg by mouth every morning. 12/30/20  Yes [provider]  loratadine (CLARITIN) 10 MG tablet Take 10 mg by mouth daily as needed for allergies.   Yes [provider]  MAGNESIUM PO Take by mouth.   Yes [provider]  metFORMIN (GLUCOPHAGE) 1000 MG tablet Take 1,000 mg by mouth 2 (two) times daily with a meal.   Yes [provider]  metoprolol succinate (TOPROL-XL) 50 MG 24 hr tablet Take 50 mg by mouth in the morning and at bedtime. 02/06/21  Yes [provider]  Probiotic Product (PROBIOTIC PO) Take by mouth.   Yes [provider]  trimethoprim (TRIMPEX) 100 MG tablet Take 100 mg by mouth daily. 03/11/21  Yes [provider]  TURMERIC PO Take by mouth.   Yes [provider]  vitamin B-12 (CYANOCOBALAMIN) 1000 MCG tablet Take 1,000 mcg by mouth daily.   Yes [provider]  furosemide (LASIX) 40 MG tablet Take 20-40 mg by mouth daily as needed for fluid. 03/21/20   [provider]  glucose blood (ONE TOUCH ULTRA TEST) test strip USE AS DIRECTED 10/04/17   Carlus Pavlov, MD  Insulin Glargine (BASAGLAR KWIKPEN) 100 UNIT/ML Inject 24-34 Units into the skin at bedtime. 12/16/20   [provider]  ipratropium (ATROVENT) 0.06 % nasal spray Place 2 sprays into both nostrils daily as needed for congestion. 02/22/20   [provider]  ondansetron (ZOFRAN ODT) 8 MG disintegrating tablet Take 1 tablet (8 mg total) by mouth every 8 (eight) hours as needed for nausea or vomiting. 05/17/21   Rhys Martini, PA-C  pantoprazole (PROTONIX) 40 MG tablet Take 40 mg by mouth 2 (two) times daily. 03/09/21   [provider]  propranolol (INDERAL) 10 MG tablet Take 10 mg by mouth 4 (four) times daily as needed (heart papatations).     [provider]    Family History Family History  Problem Relation Age of Onset   Hypertension Mother    Thyroid disease Mother    Diabetes Mother    Thyroid disease Sister    Diabetes Maternal Grandmother    Hyperlipidemia Other    Thyroid disease Other     Social History Social History   Tobacco Use   Smoking status: Former    Years: 8.00    Types: Cigarettes    Quit date: 12/16/2002    Years since quitting: 19.9   Smokeless tobacco: Never  Vaping Use   Vaping Use: Never used  Substance Use Topics   Alcohol  use: Not Currently   Drug use: No     Allergies   Ciprofloxacin, Elemental sulfur, Quinolones, Sulfa antibiotics, Trulicity [dulaglutide], Hctz [hydrochlorothiazide], and Invokana [canagliflozin]   Review of Systems Review of Systems   Physical Exam Triage Vital Signs ED Triage Vitals  Enc Vitals Group     BP 11/23/22 1625 (!) 153/96     Pulse Rate 11/23/22 1625 80     Resp 11/23/22 1625 20     Temp 11/23/22 1625 98.4 F (36.9 C)     Temp Source 11/23/22 1625 Oral     SpO2 11/23/22 1625 96 %     Weight --      Height --      Head Circumference --  Peak Flow --      Pain Score 11/23/22 1628 4     Pain Loc --      Pain Edu? --      Excl. in [place]? --    No data found.  Updated Vital Signs BP (!) 153/96   Pulse 80   Temp 98.4 F (36.9 C) (Oral)   Resp 20   LMP 07/22/2019   SpO2 96%   Visual Acuity Right Eye Distance:   Left Eye Distance:   Bilateral Distance:    Right Eye Near:   Left Eye Near:    Bilateral Near:     Physical Exam Vitals reviewed.  Constitutional:      General: She is not in acute distress.    Appearance: She is not toxic-appearing.  HENT:     Right Ear: Tympanic membrane and ear canal normal.     Left Ear: Tympanic membrane and ear canal normal.     Nose: Congestion present.     Mouth/Throat:     Mouth: Mucous membranes are moist.     Comments: There is some erythema of the posterior oropharynx. Eyes:     Extraocular Movements: Extraocular movements intact.     Conjunctiva/sclera: Conjunctivae normal.     Pupils: Pupils are equal, round, and reactive to light.  Cardiovascular:     Rate and Rhythm: Normal rate and regular rhythm.     Heart sounds: No murmur heard. Pulmonary:     Effort: No respiratory distress.     Breath sounds: No stridor. No wheezing, rhonchi or rales.  Musculoskeletal:     Cervical back: Neck supple.  Lymphadenopathy:     Cervical: No cervical adenopathy.  Skin:    Capillary Refill: Capillary refill  takes less than 2 seconds.     Coloration: Skin is not jaundiced or pale.  Neurological:     General: No focal deficit present.     Mental Status: She is alert and oriented to person, place, and time.  Psychiatric:        Behavior: Behavior normal.      UC Treatments / Results  Labs (all labs ordered are listed, but only abnormal results are displayed) Labs Reviewed - No data to display  EKG   Radiology DG Chest 2 View  Result Date: 11/23/2022 CLINICAL DATA:  Cough, wheezing, chest congestion EXAM: CHEST - 2 VIEW COMPARISON:  02/28/2020 FINDINGS: The heart size and mediastinal contours are within normal limits. Mild, diffuse bilateral interstitial opacity. The visualized skeletal structures are unremarkable. IMPRESSION: Mild, diffuse bilateral interstitial opacity, consistent with edema or atypical/viral infection. No focal airspace opacity. Electronically Signed   By: Jearld Lesch M.D.   On: 11/23/2022 17:12    Procedures Procedures (including critical care time)  Medications Ordered in UC Medications  methylPREDNISolone sodium succinate (SOLU-MEDROL) 125 mg/2 mL injection 80 mg (has no administration in time range)    Initial Impression / Assessment and Plan / UC Course  I have reviewed the triage vital signs and the nursing notes.  Pertinent labs & imaging results that were available during my care of the patient were reviewed by me and considered in my medical decision making (see chart for details).        Chest x-ray shows diffuse bilateral interstitial opacity consistent with atypical or viral infection.  I will treat with doxycycline, and she is given an injection of steroids to treat the asthma exacerbation.  She will be checking her sugars and use NovoLog  when she needs to for elevated sugars Final Clinical Impressions(s) / UC Diagnoses   Final diagnoses:  Pneumonia of both lungs due to infectious organism, unspecified part of lung  Mild intermittent asthma  with acute exacerbation     Discharge Instructions      Your chest x-ray showed some possible early pneumonia.  Take doxycycline 100 mg --1 capsule 2 times daily for 7 days  Albuterol inhaler--do 2 puffs every 4 hours as needed for shortness of breath or wheezing  You have been given a shot of methylprednisolone; check your sugars and use NovoLog as needed as this shot can elevate your sugars.        ED Prescriptions     Medication Sig Dispense Auth. Provider   doxycycline (VIBRAMYCIN) 100 MG capsule Take 1 capsule (100 mg total) by mouth 2 (two) times daily for 7 days. 14 capsule Meiya Wisler, Janace ArisPamela K, MD   albuterol (VENTOLIN HFA) 108 (90 Base) MCG/ACT inhaler Inhale 2 puffs into the lungs every 4 (four) hours as needed for wheezing or shortness of breath. 1 each Zenia ResidesBanister, Jah Alarid K, MD      I have reviewed the PDMP during this encounter.   Zenia ResidesBanister, Thanvi Blincoe K, MD 11/23/22 [phone]

## 2022-11-23 NOTE — ED Triage Notes (Signed)
C/O head congestion, nasal congestion, cough, hot flashes, dental pain/pressure, runny nose, back pain onset 3 days ago. C/O chest congestion "not moving", sweating, fatigue. No known fevers. Pt states her S.O. was diagnosed with strep. Pt denies sore throat.

## 2022-11-28 ENCOUNTER — Ambulatory Visit
Admission: RE | Admit: 2022-11-28 | Discharge: 2022-11-28 | Disposition: A | Discharge status: Home / Self Care | Payer: 59 | Source: Ambulatory Visit | Attending: Urgent Care | Admitting: Urgent Care

## 2022-11-28 VITALS — BP 136/89 | HR 67 | Temp 97.6°F | Resp 12

## 2022-11-28 DIAGNOSIS — R300 Dysuria: Secondary | ICD-10-CM | POA: Diagnosis not present

## 2022-11-28 LAB — POCT URINALYSIS DIP (MANUAL ENTRY)
Bilirubin, UA: NEGATIVE
Blood, UA: NEGATIVE
Glucose, UA: 100 mg/dL — AB
Ketones, POC UA: NEGATIVE mg/dL
Leukocytes, UA: NEGATIVE
Nitrite, UA: NEGATIVE
Protein Ur, POC: 100 mg/dL — AB
Spec Grav, UA: 1.015 (ref 1.010–1.025)
Urobilinogen, UA: 0.2 E.U./dL
pH, UA: 6.5 (ref 5.0–8.0)

## 2022-11-28 MED ORDER — URIBEL 118 MG PO CAPS: 1.0000 | ORAL_CAPSULE | Freq: Four times a day (QID) | ORAL | 0 refills | Status: AC

## 2022-11-28 NOTE — Discharge Instructions (Addendum)
Your urinalysis is positive for glucose only, it does not show signs of infection.   We have Sent your urine out for culture, if it is positive for bacteria we will call in an antibiotic. In the meantime, continue to flush with plenty of water. Try to improve your blood sugar levels, a goal fasting would be closer to 120. Take the Uribel called in today up to 4 times daily as needed for urinary discomfort.  This medication will turn your urine blue.

## 2022-11-28 NOTE — ED Triage Notes (Signed)
Pt presents with c/o dysuria that began yesterday  

## 2022-11-28 NOTE — ED Provider Notes (Signed)
[facility]    CSN: [id number] Arrival date & time: 11/28/22  0804      History   Chief Complaint Chief Complaint  Patient presents with   Dysuria    HPI Finnlee NAZARIA CASALI is a 51 y.o. female.   51 year old female with a known history of diabetes presents today due to concerns of dysuria.  She states 4 days ago, she had a single twinge while urinating.  Last evening, she states the twinge came back.  She feels like it is internal.  She was on doxycycline last week for pneumonia, she has completed this.  She also had yeast while on the antibiotic, but states this has been cleared.  She admits her blood sugar is elevated this morning, was greater than 230.  She states her urine looked dark last evening.  She denies hematuria, pelvic pain, vaginal discharge, fever.  She denies flank pain.  She took over-the-counter Cystex.  Has history of UTIs, takes trimethoprim daily to keep them at [place].  Reports last known UTI was 2 years ago.   Dysuria   Past Medical History:  Diagnosis Date   Complication of anesthesia    I had a reaction to a epidural "broke out"   Condyloma acuminatum of vulva    COVID-19    GERD (gastroesophageal reflux disease)    History of supraventricular tachycardia    HSV-1 infection    HSV-2 infection    HTN (hypertension)    Hyperlipidemia    Thyroid disease    Type 2 diabetes mellitus ([place])     Patient Active Problem List   Diagnosis Date Noted   Appendicitis 03/18/2021   E coli bacteremia 03/22/2020   Bacteremia due to Gram-negative bacteria 03/20/2020   Hypothyroidism    Pyelonephritis 02/28/2020   Intractable nausea and vomiting 02/28/2020   Nasal sinus congestion 03/15/2018   Subclinical hypothyroidism 08/05/2017   Hyperlipidemia 07/28/2016   Type 2 diabetes mellitus with hyperglycemia, without long-term current use of insulin ([place]) 04/15/2016   Sinus tachycardia 11/12/2015   S/P radiofrequency ablation operation for arrhythmia 02/24/2015    SVT (supraventricular tachycardia) ([place]) 08/13/2014   Palpitations 08/13/2014   Obesity, Class I, BMI 30-34.9 09/30/2013   HTN (hypertension) 08/01/2012    Past Surgical History:  Procedure Laterality Date   CARDIOVASCULAR STRESS TEST  06-04-2007   normal nuclear study/  no ischemia/  normal LVF, ef 63%   CESAREAN SECTION  09-06-2005   LAPAROSCOPIC APPENDECTOMY N/A 03/18/2021   Procedure: APPENDECTOMY LAPAROSCOPIC;  Surgeon: Jovita Kussmaul, MD;  Location: [facility];  Service: General;  Laterality: N/A;   LASER ABLATION CONDOLAMATA N/A 10/08/2015   Procedure: CO2 LASER ABLATION CONDOLAMATA OF VULVA;  Surgeon: Molli Posey, MD;  Location: [facility];  Service: Gynecology;  Laterality: N/A;   SUPRAVENTRICULAR TACHYCARDIA ABLATION N/A 02/24/2015   Procedure: SUPRAVENTRICULAR TACHYCARDIA ABLATION;  Surgeon: Evans Lance, MD;  Location: [facility] CATH LAB;  Service: Cardiovascular;  Laterality: N/A;   TRANSTHORACIC ECHOCARDIOGRAM  01-06-2009   normal LVF, ef 55-60%,  trace MR and TR/  normal stress echo    OB History   No obstetric history on file.      Home Medications    Prior to Admission medications   Medication Sig Start Date End Date Taking? Authorizing Provider  Meth-Hyo-M Bl-Na Phos-Ph Sal (URIBEL) 118 MG CAPS Take 1 capsule (118 mg total) by mouth in the morning, at noon, in the evening, and at bedtime. 11/28/22  Yes Brayant Dorr,  Shaela Boer L, PA  albuterol (VENTOLIN HFA) 108 (90 Base) MCG/ACT inhaler Inhale 2 puffs into the lungs every 4 (four) hours as needed for wheezing or shortness of breath. 11/23/22   Barrett Henle, MD  ferrous sulfate 324 MG TBEC Take 324 mg by mouth daily with breakfast.    [provider]  furosemide (LASIX) 40 MG tablet Take 20-40 mg by mouth daily as needed for fluid. 03/21/20   [provider]  glucose blood (ONE TOUCH ULTRA TEST) test strip USE AS DIRECTED 10/04/17   Philemon Kingdom, MD  insulin aspart (NOVOLOG) 100  UNIT/ML injection Inject 1 Units into the skin 3 (three) times daily before meals. Sliding scale    [provider]  Insulin Glargine (BASAGLAR KWIKPEN) 100 UNIT/ML Inject 24-34 Units into the skin at bedtime. 12/16/20   [provider]  ipratropium (ATROVENT) 0.06 % nasal spray Place 2 sprays into both nostrils daily as needed for congestion. 02/22/20   [provider]  levothyroxine (SYNTHROID) 88 MCG tablet Take 88 mcg by mouth every morning. 12/30/20   [provider]  loratadine (CLARITIN) 10 MG tablet Take 10 mg by mouth daily as needed for allergies.    [provider]  MAGNESIUM PO Take by mouth.    [provider]  metFORMIN (GLUCOPHAGE) 1000 MG tablet Take 1,000 mg by mouth 2 (two) times daily with a meal.    [provider]  metoprolol succinate (TOPROL-XL) 50 MG 24 hr tablet Take 50 mg by mouth in the morning and at bedtime. 02/06/21   [provider]  ondansetron (ZOFRAN ODT) 8 MG disintegrating tablet Take 1 tablet (8 mg total) by mouth every 8 (eight) hours as needed for nausea or vomiting. 05/17/21   Hazel Sams, PA-C  pantoprazole (PROTONIX) 40 MG tablet Take 40 mg by mouth 2 (two) times daily. 03/09/21   [provider]  Probiotic Product (PROBIOTIC PO) Take by mouth.    [provider]  propranolol (INDERAL) 10 MG tablet Take 10 mg by mouth 4 (four) times daily as needed (heart papatations).     [provider]  trimethoprim (TRIMPEX) 100 MG tablet Take 100 mg by mouth daily. 03/11/21   [provider]  TURMERIC PO Take by mouth.    [provider]  vitamin B-12 (CYANOCOBALAMIN) 1000 MCG tablet Take 1,000 mcg by mouth daily.    [provider]    Family History Family History  Problem Relation Age of Onset   Hypertension Mother    Thyroid disease Mother    Diabetes Mother    Thyroid disease Sister    Diabetes Maternal Grandmother    Hyperlipidemia Other     Thyroid disease Other     Social History Social History   Tobacco Use   Smoking status: Former    Years: 8.00    Types: Cigarettes    Quit date: 12/16/2002    Years since quitting: 19.9   Smokeless tobacco: Never  Vaping Use   Vaping Use: Never used  Substance Use Topics   Alcohol use: Not Currently   Drug use: No     Allergies   Ciprofloxacin, Elemental sulfur, Quinolones, Sulfa antibiotics, Trulicity [dulaglutide], Hctz [hydrochlorothiazide], and Invokana [canagliflozin]   Review of Systems Review of Systems  Genitourinary:  Positive for dysuria.  As per HPI   Physical Exam Triage Vital Signs ED Triage Vitals  Enc Vitals Group     BP 11/28/22 0810 136/89  Pulse Rate 11/28/22 0810 67     Resp 11/28/22 0810 12     Temp 11/28/22 0810 97.6 F (36.4 C)     Temp Source 11/28/22 0810 Oral     SpO2 11/28/22 0810 99 %     Weight --      Height --      Head Circumference --      Peak Flow --      Pain Score 11/28/22 0809 0     Pain Loc --      Pain Edu? --      Excl. in GC? --    No data found.  Updated Vital Signs BP 136/89 (BP Location: Left Arm)   Pulse 67   Temp 97.6 F (36.4 C) (Oral)   Resp 12   LMP 07/22/2019   SpO2 99%   Visual Acuity Right Eye Distance:   Left Eye Distance:   Bilateral Distance:    Right Eye Near:   Left Eye Near:    Bilateral Near:     Physical Exam Vitals and nursing note reviewed.  Constitutional:      General: She is not in acute distress.    Appearance: Normal appearance. She is normal weight. She is not ill-appearing, toxic-appearing or diaphoretic.  HENT:     Head: Normocephalic and atraumatic.  Eyes:     Pupils: Pupils are equal, round, and reactive to light.  Cardiovascular:     Rate and Rhythm: Normal rate.     Pulses: Normal pulses.     Heart sounds: Normal heart sounds. No murmur heard.    No friction rub. No gallop.  Pulmonary:     Effort: Pulmonary effort is normal. No respiratory distress.      Breath sounds: Normal breath sounds. No wheezing or rales.  Chest:     Chest wall: No tenderness.  Abdominal:     General: Abdomen is flat. Bowel sounds are normal. There is no distension.     Palpations: Abdomen is soft. There is no mass.     Tenderness: There is no abdominal tenderness. There is no right CVA tenderness, left CVA tenderness, guarding or rebound.     Hernia: No hernia is present.  Musculoskeletal:     Right lower leg: No edema.     Left lower leg: No edema.  Skin:    General: Skin is warm.     Findings: No bruising, erythema or rash.  Neurological:     General: No focal deficit present.     Mental Status: She is alert. Mental status is at baseline.  Psychiatric:        Mood and Affect: Mood normal.      UC Treatments / Results  Labs (all labs ordered are listed, but only abnormal results are displayed) Labs Reviewed  POCT URINALYSIS DIP (MANUAL ENTRY) - Abnormal; Notable for the following components:      Result Value   Glucose, UA =100 (*)    Protein Ur, POC =100 (*)    All other components within normal limits  URINE CULTURE    EKG   Radiology No results found.  Procedures Procedures (including critical care time)  Medications Ordered in UC Medications - No data to display  Initial Impression / Assessment and Plan / UC Course  I have reviewed the triage vital signs and the nursing notes.  Pertinent labs & imaging results that were available during my care of the patient were reviewed by me and considered  in my medical decision making (see chart for details).     Dysuria -UA not indicative of infectious cause.  Will send out culture for confirmation.  Will treat symptoms with Uribel as needed.  I did discuss the glucose in patient's urine, she states she knows her blood sugars have been elevated due to indiscretion with diet over the holidays.  Will follow-up with PCP for DM management.  Final Clinical Impressions(s) / UC Diagnoses   Final  diagnoses:  Dysuria     Discharge Instructions      Your urinalysis is positive for glucose only, it does not show signs of infection.   We have Sent your urine out for culture, if it is positive for bacteria we will call in an antibiotic. In the meantime, continue to flush with plenty of water. Try to improve your blood sugar levels, a goal fasting would be closer to 120. Take the Uribel called in today up to 4 times daily as needed for urinary discomfort.  This medication will turn your urine blue.     ED Prescriptions     Medication Sig Dispense Auth. Provider   Meth-Hyo-M Bl-Na Phos-Ph Sal (URIBEL) 118 MG CAPS Take 1 capsule (118 mg total) by mouth in the morning, at noon, in the evening, and at bedtime. 30 capsule Anastasios Melander L, [place]      PDMP not reviewed this encounter.   Chaney Malling, [place] 11/28/22 [phone]

## 2022-11-29 ENCOUNTER — Ambulatory Visit: Payer: Self-pay

## 2022-11-29 LAB — URINE CULTURE: Culture: <10000 — AB

## 2023-08-10 ENCOUNTER — Telehealth: Payer: 59 | Admitting: Physician Assistant

## 2023-08-10 DIAGNOSIS — R3989 Other symptoms and signs involving the genitourinary system: Secondary | ICD-10-CM | POA: Diagnosis not present

## 2023-08-10 MED ORDER — CEPHALEXIN 500 MG PO CAPS: 500.0000 mg | ORAL_CAPSULE | Freq: Two times a day (BID) | ORAL | 0 refills | Status: AC

## 2023-08-10 NOTE — Progress Notes (Signed)
E-Visit for Urinary Problems  We are sorry that you are not feeling well.  Here is how we plan to help!  Based on what you shared with me it looks like you most likely have a simple urinary tract infection.  A UTI (Urinary Tract Infection) is a bacterial infection of the bladder.  Most cases of urinary tract infections are simple to treat but a key part of your care is to encourage you to drink plenty of fluids and watch your symptoms carefully.  I have prescribed Keflex 500 mg twice a day for 7 days.  Your symptoms should gradually improve. Call us if the burning in your urine worsens, you develop worsening fever, back pain or pelvic pain or if your symptoms do not resolve after completing the antibiotic.  Urinary tract infections can be prevented by drinking plenty of water to keep your body hydrated.  Also be sure when you wipe, wipe from front to back and don't hold it in!  If possible, empty your bladder every 4 hours.  HOME CARE Drink plenty of fluids Compete the full course of the antibiotics even if the symptoms resolve Remember, when you need to go.go. Holding in your urine can increase the likelihood of getting a UTI! GET HELP RIGHT AWAY IF: You cannot urinate You get a high fever Worsening back pain occurs You see blood in your urine You feel sick to your stomach or throw up You feel like you are going to pass out  MAKE SURE YOU  Understand these instructions. Will watch your condition. Will get help right away if you are not doing well or get worse.   Thank you for choosing an e-visit.  Your e-visit answers were reviewed by a board certified advanced clinical practitioner to complete your personal care plan. Depending upon the condition, your plan could have included both over the counter or prescription medications.  Please review your pharmacy choice. Make sure the pharmacy is open so you can pick up prescription now. If there is a problem, you may contact your  provider through MyChart messaging and have the prescription routed to another pharmacy.  Your safety is important to us. If you have drug allergies check your prescription carefully.   For the next 24 hours you can use MyChart to ask questions about today's visit, request a non-urgent call back, or ask for a work or school excuse. You will get an email in the next two days asking about your experience. I hope that your e-visit has been valuable and will speed your recovery.  I have spent 5 minutes in review of e-visit questionnaire, review and updating patient chart, medical decision making and response to patient.   Jennifer M Burnette, PA-C  

## 2024-08-06 ENCOUNTER — Other Ambulatory Visit: Payer: Self-pay

## 2024-08-06 DIAGNOSIS — I10 Essential (primary) hypertension: Secondary | ICD-10-CM | POA: Diagnosis not present

## 2024-08-06 DIAGNOSIS — D72829 Elevated white blood cell count, unspecified: Secondary | ICD-10-CM | POA: Diagnosis not present

## 2024-08-06 DIAGNOSIS — E871 Hypo-osmolality and hyponatremia: Secondary | ICD-10-CM | POA: Diagnosis not present

## 2024-08-06 DIAGNOSIS — N289 Disorder of kidney and ureter, unspecified: Secondary | ICD-10-CM | POA: Diagnosis not present

## 2024-08-06 DIAGNOSIS — Z8616 Personal history of COVID-19: Secondary | ICD-10-CM | POA: Diagnosis not present

## 2024-08-06 DIAGNOSIS — Z79899 Other long term (current) drug therapy: Secondary | ICD-10-CM | POA: Diagnosis not present

## 2024-08-06 DIAGNOSIS — E876 Hypokalemia: Secondary | ICD-10-CM | POA: Diagnosis not present

## 2024-08-06 DIAGNOSIS — Z794 Long term (current) use of insulin: Secondary | ICD-10-CM | POA: Insufficient documentation

## 2024-08-06 DIAGNOSIS — E1165 Type 2 diabetes mellitus with hyperglycemia: Secondary | ICD-10-CM | POA: Diagnosis not present

## 2024-08-06 DIAGNOSIS — R1032 Left lower quadrant pain: Secondary | ICD-10-CM | POA: Diagnosis present

## 2024-08-06 DIAGNOSIS — N132 Hydronephrosis with renal and ureteral calculous obstruction: Secondary | ICD-10-CM | POA: Insufficient documentation

## 2024-08-07 ENCOUNTER — Emergency Department (HOSPITAL_BASED_OUTPATIENT_CLINIC_OR_DEPARTMENT_OTHER)
Admission: EM | Admit: 2024-08-07 | Discharge: 2024-08-07 | Disposition: A | Discharge status: Skilled Nursing Facility | Attending: Emergency Medicine | Admitting: Emergency Medicine

## 2024-08-07 ENCOUNTER — Encounter (HOSPITAL_BASED_OUTPATIENT_CLINIC_OR_DEPARTMENT_OTHER): Payer: Self-pay

## 2024-08-07 ENCOUNTER — Other Ambulatory Visit: Payer: Self-pay

## 2024-08-07 ENCOUNTER — Emergency Department (HOSPITAL_BASED_OUTPATIENT_CLINIC_OR_DEPARTMENT_OTHER)

## 2024-08-07 DIAGNOSIS — N201 Calculus of ureter: Secondary | ICD-10-CM

## 2024-08-07 LAB — URINALYSIS, ROUTINE W REFLEX MICROSCOPIC
Bacteria, UA: NONE SEEN
Bilirubin Urine: NEGATIVE
Glucose, UA: >1000 mg/dL — AB
Ketones, ur: 15 mg/dL — AB
Nitrite: NEGATIVE
Protein, ur: 30 mg/dL — AB
Specific Gravity, Urine: 1.027 (ref 1.005–1.030)
pH: 7 (ref 5.0–8.0)

## 2024-08-07 LAB — CBC
HCT: 40.8 % (ref 36.0–46.0)
Hemoglobin: 13.9 g/dL (ref 12.0–15.0)
MCH: 27.6 pg (ref 26.0–34.0)
MCHC: 34.1 g/dL (ref 30.0–36.0)
MCV: 81.1 fL (ref 80.0–100.0)
Platelets: 247 K/uL (ref 150–400)
RBC: 5.03 MIL/uL (ref 3.87–5.11)
RDW: 13.2 % (ref 11.5–15.5)
WBC: 11.6 K/uL — ABNORMAL HIGH (ref 4.0–10.5)
nRBC: 0 % (ref 0.0–0.2)

## 2024-08-07 LAB — COMPREHENSIVE METABOLIC PANEL WITH GFR
ALT: 12 U/L (ref 0–44)
AST: 20 U/L (ref 15–41)
Albumin: 4.6 g/dL (ref 3.5–5.0)
Alkaline Phosphatase: 178 U/L — ABNORMAL HIGH (ref 38–126)
Anion gap: 15 (ref 5–15)
BUN: 20 mg/dL (ref 6–20)
CO2: 24 mmol/L (ref 22–32)
Calcium: 10 mg/dL (ref 8.9–10.3)
Chloride: 95 mmol/L — ABNORMAL LOW (ref 98–111)
Creatinine, Ser: 1.2 mg/dL — ABNORMAL HIGH (ref 0.44–1.00)
GFR, Estimated: 53 mL/min — ABNORMAL LOW (ref >60)
Glucose, Bld: 371 mg/dL — ABNORMAL HIGH (ref 70–99)
Potassium: 4.5 mmol/L (ref 3.5–5.1)
Sodium: 134 mmol/L — ABNORMAL LOW (ref 135–145)
Total Bilirubin: 0.4 mg/dL (ref 0.0–1.2)
Total Protein: 7.7 g/dL (ref 6.5–8.1)

## 2024-08-07 LAB — LIPASE, BLOOD: Lipase: 39 U/L (ref 11–51)

## 2024-08-07 LAB — CBG MONITORING, ED: Glucose-Capillary: 259 mg/dL — ABNORMAL HIGH (ref 70–99)

## 2024-08-07 MED ORDER — KETOROLAC TROMETHAMINE 10 MG PO TABS: 10.0000 mg | ORAL_TABLET | Freq: Three times a day (TID) | ORAL | 0 refills | Status: AC | PRN

## 2024-08-07 MED ORDER — ONDANSETRON 4 MG PO TBDP: 4.0000 mg | ORAL_TABLET | Freq: Three times a day (TID) | ORAL | 0 refills | Status: AC | PRN

## 2024-08-07 MED ORDER — TAMSULOSIN HCL 0.4 MG PO CAPS: 0.4000 mg | ORAL_CAPSULE | Freq: Every day | ORAL | 0 refills | Status: AC

## 2024-08-07 MED ORDER — HYDROMORPHONE HCL 1 MG/ML IJ SOLN
0.5000 mg | Freq: Once | INTRAMUSCULAR | Status: AC
  Administered 2024-08-07: 0.5 mg via INTRAVENOUS
  Filled 2024-08-07: qty 1

## 2024-08-07 MED ORDER — OXYCODONE-ACETAMINOPHEN 5-325 MG PO TABS: 0.5000 | ORAL_TABLET | Freq: Three times a day (TID) | ORAL | 0 refills | Status: AC | PRN

## 2024-08-07 MED ORDER — IOHEXOL 300 MG/ML  SOLN
100.0000 mL | Freq: Once | INTRAMUSCULAR | Status: AC | PRN
  Administered 2024-08-07: 100 mL via INTRAVENOUS

## 2024-08-07 MED ORDER — SODIUM CHLORIDE 0.9 % IV BOLUS
500.0000 mL | Freq: Once | INTRAVENOUS | Status: AC
  Administered 2024-08-07: 500 mL via INTRAVENOUS

## 2024-08-07 MED ORDER — TAMSULOSIN HCL 0.4 MG PO CAPS
0.4000 mg | ORAL_CAPSULE | Freq: Once | ORAL | Status: AC
  Administered 2024-08-07: 0.4 mg via ORAL
  Filled 2024-08-07: qty 1

## 2024-08-07 MED ORDER — KETOROLAC TROMETHAMINE 15 MG/ML IJ SOLN
7.5000 mg | Freq: Once | INTRAMUSCULAR | Status: AC
  Administered 2024-08-07: 7.5 mg via INTRAVENOUS
  Filled 2024-08-07: qty 1

## 2024-08-07 NOTE — ED Triage Notes (Signed)
 Pt presents via POV c/o generalized abd pain. Reports felt bloated during dinner and pain has been moving around abd since. Reports took gas-x without relief. Reports LLQ abd pain currently. Reports N/V.   Ambulatory to triage. A&O x4.

## 2024-08-07 NOTE — ED Provider Notes (Signed)
 [facility] Provider Note  CSN: [id number] Arrival date & time: 08/06/24 2350  Chief Complaint(s) Abdominal Pain  HPI Julia [name] is a 53 y.o. female with a past medical history listed below here for left-sided abdominal pain initially fluctuating in nature but has been constant over the past several hours.  Said on the left lower quadrant.  Pain worse with movement and palpation.  Alleviated by certain positions.  Associated with nausea and nonbloody nonbilious emesis.  Patient does report foul-smelling urine.  No dysuria.  HPI  Past Medical History Past Medical History:  Diagnosis Date   Complication of anesthesia    I had a reaction to a epidural broke out   Condyloma acuminatum of vulva    COVID-19    GERD (gastroesophageal reflux disease)    History of supraventricular tachycardia    HSV-1 infection    HSV-2 infection    HTN (hypertension)    Hyperlipidemia    Thyroid  disease    Type 2 diabetes mellitus (HCC)    Patient Active Problem List   Diagnosis Date Noted   Appendicitis 03/18/2021   E coli bacteremia 03/22/2020   Bacteremia due to Gram-negative bacteria 03/20/2020   Hypothyroidism    Pyelonephritis 02/28/2020   Intractable nausea and vomiting 02/28/2020   Nasal sinus congestion 03/15/2018   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 Authorizing Provider  ketorolac  (TORADOL ) 10 MG tablet Take 1 tablet (10 mg total) by mouth every 8 (eight)  hours as needed for moderate pain (pain score 4-6). 08/07/24  Yes Biviana Saddler, Raynell Moder, MD  ondansetron  (ZOFRAN -ODT) 4 MG disintegrating tablet Take 1 tablet (4 mg total) by mouth every 8 (eight) hours as needed for up to 3 days for nausea or vomiting. 08/07/24 08/10/24 Yes Tiandre Teall, Raynell Moder, MD  oxyCODONE -acetaminophen  (PERCOCET) 5-325 MG tablet Take 0.5-1 tablets by mouth every 8 (eight) hours as needed for up to 5 days for severe pain (pain score 7-10) (Not controlled with Toradol ). Please do not exceed 4000 mg of acetaminophen  (Tylenol ) a 24-hour period. Please note that he may be prescribed additional medicine that contains acetaminophen . 08/07/24 08/12/24 Yes Kahari Critzer, Raynell Moder, MD  tamsulosin  (FLOMAX ) 0.4 MG CAPS capsule Take 1 capsule (0.4 mg total) by mouth daily for 5 days. 08/07/24 08/12/24 Yes Cacie Gaskins, Raynell Moder, MD  cephALEXin  (KEFLEX ) 500 MG capsule Take 1 capsule (500 mg total) by mouth 2 (two) times daily. 08/10/23   Vivienne Delon HERO, PA-C  furosemide  (LASIX ) 40 MG tablet Take 20-40 mg by mouth daily as needed for fluid. 03/21/20   [provider]  glucose blood (ONE TOUCH ULTRA TEST) test strip USE AS DIRECTED 10/04/17   Trixie File, MD  insulin  aspart (NOVOLOG ) 100 UNIT/ML injection Inject 1 Units into the skin 3 (three) times daily before meals. Sliding scale    [provider]  Insulin  Glargine (BASAGLAR  KWIKPEN) 100 UNIT/ML Inject 24-34 Units into the skin at bedtime. 12/16/20   [provider]  levothyroxine  (SYNTHROID ) 88 MCG tablet Take 88 mcg by mouth every morning. 12/30/20  [provider]  loratadine  (CLARITIN ) 10 MG tablet Take 10 mg by mouth daily as needed for allergies.    [provider]  MAGNESIUM  PO Take by mouth.    [provider]  metFORMIN  (GLUCOPHAGE ) 1000 MG tablet Take 1,000 mg by mouth 2 (two) times daily with a meal.    [provider]  Meth-Hyo-M Bl-Na Phos-Ph Sal (URIBEL ) 118 MG CAPS Take 1  capsule (118 mg total) by mouth in the morning, at noon, in the evening, and at bedtime. 11/28/22   Crain, Whitney L, PA  metoprolol  succinate (TOPROL -XL) 50 MG 24 hr tablet Take 50 mg by mouth in the morning and at bedtime. 02/06/21   [provider]  Probiotic Product (PROBIOTIC PO) Take by mouth.    [provider]  propranolol  (INDERAL ) 10 MG tablet Take 10 mg by mouth 4 (four) times daily as needed (heart papatations).     [provider]  trimethoprim  (TRIMPEX ) 100 MG tablet Take 100 mg by mouth daily. 03/11/21   [provider]  TURMERIC PO Take by mouth.    [provider]  vitamin B-12 (CYANOCOBALAMIN) 1000 MCG tablet Take 1,000 mcg by mouth daily.    [provider]                                                                                                                                    Allergies Ciprofloxacin , Elemental sulfur, Quinolones, Sulfa  antibiotics, Trulicity  [dulaglutide ], Hctz [hydrochlorothiazide ], and Invokana  [canagliflozin ]  Review of Systems Review of Systems As noted in HPI  Physical Exam Vital Signs  I have reviewed the triage vital signs BP (!) 144/94 (BP Location: Right Arm)   Pulse (!) 106   Temp 98.4 F (36.9 C) (Oral)   Resp 18   Ht 5' 4 (1.626 m)   Wt 77.1 kg   LMP 07/22/2019   SpO2 100%   BMI 29.18 kg/m   Physical Exam Vitals reviewed.  Constitutional:      General: She is not in acute distress.    Appearance: She is well-developed. She is not diaphoretic.  HENT:     Head: Normocephalic and atraumatic.     Right Ear: External ear normal.     Left Ear: External ear normal.     Nose: Nose normal.  Eyes:     General: No scleral icterus.    Conjunctiva/sclera: Conjunctivae normal.  Neck:     Trachea: Phonation normal.  Cardiovascular:     Rate and Rhythm: Normal rate and regular rhythm.  Pulmonary:     Effort: Pulmonary effort is normal. No respiratory distress.     Breath  sounds: No stridor.  Abdominal:     General: There is no distension.     Tenderness: There is abdominal tenderness in the left lower quadrant.  Musculoskeletal:        General: Normal range of motion.  Cervical back: Normal range of motion.  Neurological:     Mental Status: She is alert and oriented to person, place, and time.  Psychiatric:        Behavior: Behavior normal.     ED Results and Treatments Labs (all labs ordered are listed, but only abnormal results are displayed) Labs Reviewed  COMPREHENSIVE METABOLIC PANEL WITH GFR - Abnormal; Notable for the following components:      Result Value   Sodium 134 (*)    Chloride 95 (*)    Glucose, Bld 371 (*)    Creatinine, Ser 1.20 (*)    Alkaline Phosphatase 178 (*)    GFR, Estimated 53 (*)    All other components within normal limits  CBC - Abnormal; Notable for the following components:   WBC 11.6 (*)    All other components within normal limits  URINALYSIS, ROUTINE W REFLEX MICROSCOPIC - Abnormal; Notable for the following components:   Glucose, UA >1,000 (*)    Hgb urine dipstick SMALL (*)    Ketones, ur 15 (*)    Protein, ur 30 (*)    Leukocytes,Ua TRACE (*)    All other components within normal limits  LIPASE, BLOOD                                                                                                                         EKG  EKG Interpretation Date/Time:    Ventricular Rate:    PR Interval:    QRS Duration:    QT Interval:    QTC Calculation:   R Axis:      Text Interpretation:         Radiology CT ABDOMEN PELVIS W CONTRAST Result Date: 08/07/2024 EXAM: CT ABDOMEN AND PELVIS WITH CONTRAST 08/07/2024 05:46:14 AM TECHNIQUE: CT of the abdomen and pelvis was performed with the administration of of intravenous iohexol  (OMNIPAQUE ) 300 mg/mL solution. Multiplanar reformatted images are provided for review. Automated exposure control, iterative reconstruction, and/or weight-based adjustment  of the mA/kV was utilized to reduce the radiation dose to as low as reasonably achievable. COMPARISON: CT of the abdomen and pelvis dated 03/18/2021. CLINICAL HISTORY: LLQ abdominal pain. Table formatting from the original note was not included. Images from the original note were not included. Pt presents via POV c/o generalized abd pain. Reports felt bloated during dinner and pain has been moving around abd since. Reports took gas-x without relief. Reports LLQ abd pain currently. Reports N/V. FINDINGS: LOWER CHEST: Visualized lung bases are clear. LIVER: Normal size and contour. GALLBLADDER AND BILE DUCTS: There is a stone within the gallbladder. No wall thickening. No biliary ductal dilatation. No evidence of cholecystitis. SPLEEN: Normal size. No focal lesion. PANCREAS: No mass. No ductal dilatation. ADRENAL GLANDS: Normal appearance. No mass. KIDNEYS, URETERS AND BLADDER: There is an ovoid calculus measuring approximately 10 x 5 mm in the left proximal ureter which is causing moderate left hydronephrosis and a delayed nephrogram. There is mild left  perinephric fatty stranding. No stones in the right kidney or ureter. No hydronephrosis on the right. Urinary bladder is unremarkable. GI AND BOWEL: Stomach demonstrates no acute abnormality. There is no bowel obstruction. No bowel wall thickening. There are few sigmoid diverticula present. The patient is status post appendectomy. PERITONEUM AND RETROPERITONEUM: No ascites. No free air. VASCULATURE: Aorta is normal in caliber. LYMPH NODES: No lymphadenopathy. REPRODUCTIVE ORGANS: No significant abnormality. BONES AND SOFT TISSUES: No acute osseous abnormality. No focal soft tissue abnormality. IMPRESSION: 1. Ovoid calculus measuring approximately 10 x 5 mm in the left proximal ureter causing moderate left hydronephrosis, delayed nephrogram, and mild left perinephric fatty stranding. 2. Stone within the gallbladder without evidence of cholecystitis. 3. Few sigmoid  diverticula without evidence of diverticulitis. Electronically signed by: Evalene Coho MD 08/07/2024 05:50 AM EDT RP Workstation: [id number]    Medications Ordered in ED Medications  HYDROmorphone  (DILAUDID ) injection 0.5 mg (0.5 mg Intravenous Given 08/07/24 0533)  iohexol  (OMNIPAQUE ) 300 MG/ML solution 100 mL (100 mLs Intravenous Contrast Given 08/07/24 0538)  ketorolac  (TORADOL ) 15 MG/ML injection 7.5 mg (7.5 mg Intravenous Given 08/07/24 0611)  tamsulosin  (FLOMAX ) capsule 0.4 mg (0.4 mg Oral Given 08/07/24 0610)   Procedures Procedures  (including critical care time) Medical Decision Making / ED Course   Medical Decision Making Amount and/or Complexity of Data Reviewed Labs: ordered. Decision-making details documented in ED Course. Radiology: ordered and independent interpretation performed. Decision-making details documented in ED Course.  Risk Prescription drug management. Decision regarding hospitalization.     Left lower quadrant abdominal pain with tenderness to palpation Differential diagnoses considered  CBC with leukocytosis.  No anemia. CMP with hyperglycemia.  No evidence of DKA.  Mild hyponatremia and hypokalemia.  No other electrolyte derangements.  Mild renal sufficiency without overt AKI.  No evidence of bili obstruction or pancreatitis.  UA not overtly concerning for UTI.  On review of records, patient does have a history of diverticulosis noted on prior CT imaging.  CT scan obtained to assess for diverticulitis.  Will also assess for renal stones.  CT stone negative for acute intra-abdominal Fama to assess infectious process but did note 1 cm left ureteral stone    Final Clinical Impression(s) / ED Diagnoses Final diagnoses:  Left ureteral calculus   The patient appears reasonably screened and/or stabilized for discharge and I doubt any other medical condition or other [facility] requiring further screening, evaluation, or treatment in the ED at this time. I have  discussed the findings, Dx and Tx plan with the patient/family who expressed understanding and agree(s) with the plan. Discharge instructions discussed at length. The patient/family was given strict return precautions who verbalized understanding of the instructions. No further questions at time of discharge.  Disposition: Discharge  Condition: Good  ED Discharge Orders          Ordered    tamsulosin  (FLOMAX ) 0.4 MG CAPS capsule  Daily        08/07/24 0747    ketorolac  (TORADOL ) 10 MG tablet  Every 8 hours PRN        08/07/24 0747    oxyCODONE -acetaminophen  (PERCOCET) 5-325 MG tablet  Every 8 hours PRN        08/07/24 0747    ondansetron  (ZOFRAN -ODT) 4 MG disintegrating tablet  Every 8 hours PRN        08/07/24 0747            Follow Up: Primary care provider  Schedule an appointment as soon as possible for a  visit    Carolee Sherwood JONETTA DOUGLAS, MD [address] [place] [id number] [phone]       This chart was dictated using voice recognition software.  Despite best efforts to proofread,  errors can occur which can change the documentation meaning.    Trine Raynell Moder, MD 08/07/24 [phone]

## 2024-08-11 ENCOUNTER — Other Ambulatory Visit: Payer: Self-pay | Admitting: Urology

## 2024-08-12 ENCOUNTER — Encounter (HOSPITAL_COMMUNITY): Payer: Self-pay | Admitting: Urology

## 2024-08-12 NOTE — Progress Notes (Signed)
 Spoke w/ via phone for pre-op interview--- Julia [name] needs dos----  BMP, EKG, A1C and CBG per anesthesia.       [name] results------ COVID test -----patient states asymptomatic no test needed Arrive at -------1215 NPO after MN NO Solid Food.  Clear liquids from MN until---1115 Pre-Surgery Ensure or G2:  Med rec completed Medications to take morning of surgery -----Levothyroxine , Metoprolol , Oxycodone -PRN and Keflex . Diabetic medication ----- 1/2 insulin  dose night before, no insulin  or diabetes medication AM of surgery.  GLP1 agonist last dose: Mounjaro last dose 08/04/24. GLP1 instructions: Hold any further doses until after surgery.  Patient instructed no nail polish to be worn day of surgery Patient instructed to bring photo id and insurance card day of surgery Patient aware to have Driver (ride ) / caregiver    for 24 hours after surgery - Significant other Julia [name] Patient Special Instructions ----- All jewelry needs to be removed. Pre-Op special Instructions -----  Patient verbalized understanding of instructions that were given at this phone interview. Patient denies chest pain, sob, fever, cough at the interview.

## 2024-08-15 ENCOUNTER — Ambulatory Visit (HOSPITAL_COMMUNITY): Admitting: Anesthesiology

## 2024-08-15 ENCOUNTER — Ambulatory Visit (HOSPITAL_COMMUNITY)

## 2024-08-15 ENCOUNTER — Ambulatory Visit (HOSPITAL_COMMUNITY)
Admission: RE | Admit: 2024-08-15 | Discharge: 2024-08-15 | Disposition: A | Discharge status: Home / Self Care | Attending: Urology | Admitting: Urology

## 2024-08-15 ENCOUNTER — Encounter (HOSPITAL_COMMUNITY): Payer: Self-pay | Admitting: Urology

## 2024-08-15 ENCOUNTER — Encounter (HOSPITAL_COMMUNITY)
Admission: RE | Disposition: A | Discharge status: Home / Self Care | Payer: Self-pay | Source: Home / Self Care | Attending: Urology

## 2024-08-15 DIAGNOSIS — N3020 Other chronic cystitis without hematuria: Secondary | ICD-10-CM | POA: Insufficient documentation

## 2024-08-15 DIAGNOSIS — E039 Hypothyroidism, unspecified: Secondary | ICD-10-CM | POA: Insufficient documentation

## 2024-08-15 DIAGNOSIS — N289 Disorder of kidney and ureter, unspecified: Secondary | ICD-10-CM | POA: Diagnosis not present

## 2024-08-15 DIAGNOSIS — E119 Type 2 diabetes mellitus without complications: Secondary | ICD-10-CM | POA: Insufficient documentation

## 2024-08-15 DIAGNOSIS — Z87891 Personal history of nicotine dependence: Secondary | ICD-10-CM | POA: Diagnosis not present

## 2024-08-15 DIAGNOSIS — K219 Gastro-esophageal reflux disease without esophagitis: Secondary | ICD-10-CM | POA: Diagnosis not present

## 2024-08-15 DIAGNOSIS — Z7985 Long-term (current) use of injectable non-insulin antidiabetic drugs: Secondary | ICD-10-CM | POA: Insufficient documentation

## 2024-08-15 DIAGNOSIS — Z79899 Other long term (current) drug therapy: Secondary | ICD-10-CM | POA: Diagnosis not present

## 2024-08-15 DIAGNOSIS — Z7984 Long term (current) use of oral hypoglycemic drugs: Secondary | ICD-10-CM | POA: Diagnosis not present

## 2024-08-15 DIAGNOSIS — N132 Hydronephrosis with renal and ureteral calculous obstruction: Secondary | ICD-10-CM | POA: Diagnosis not present

## 2024-08-15 DIAGNOSIS — I10 Essential (primary) hypertension: Secondary | ICD-10-CM

## 2024-08-15 DIAGNOSIS — I471 Supraventricular tachycardia, unspecified: Secondary | ICD-10-CM | POA: Insufficient documentation

## 2024-08-15 DIAGNOSIS — E1165 Type 2 diabetes mellitus with hyperglycemia: Secondary | ICD-10-CM

## 2024-08-15 DIAGNOSIS — N201 Calculus of ureter: Secondary | ICD-10-CM | POA: Diagnosis present

## 2024-08-15 HISTORY — DX: Personal history of urinary calculi: Z87.442

## 2024-08-15 HISTORY — PX: CYSTOSCOPY/URETEROSCOPY/HOLMIUM LASER/STENT PLACEMENT: SHX6546

## 2024-08-15 LAB — HEMOGLOBIN A1C
Hgb A1c MFr Bld: 9.8 % — ABNORMAL HIGH (ref 4.8–5.6)
Mean Plasma Glucose: 234.56 mg/dL

## 2024-08-15 LAB — BASIC METABOLIC PANEL WITH GFR
Anion gap: 10 (ref 5–15)
BUN: 15 mg/dL (ref 6–20)
CO2: 24 mmol/L (ref 22–32)
Calcium: 9.1 mg/dL (ref 8.9–10.3)
Chloride: 100 mmol/L (ref 98–111)
Creatinine, Ser: 0.91 mg/dL (ref 0.44–1.00)
GFR, Estimated: >60 mL/min (ref >60)
Glucose, Bld: 332 mg/dL — ABNORMAL HIGH (ref 70–99)
Potassium: 3.8 mmol/L (ref 3.5–5.1)
Sodium: 134 mmol/L — ABNORMAL LOW (ref 135–145)

## 2024-08-15 LAB — GLUCOSE, CAPILLARY
Glucose-Capillary: 308 mg/dL — ABNORMAL HIGH (ref 70–99)
Glucose-Capillary: 250 mg/dL — ABNORMAL HIGH (ref 70–99)

## 2024-08-15 SURGERY — CYSTOSCOPY/URETEROSCOPY/HOLMIUM LASER/STENT PLACEMENT
Anesthesia: General | Laterality: Left

## 2024-08-15 MED ORDER — CEFAZOLIN SODIUM-DEXTROSE 2-4 GM/100ML-% IV SOLN
INTRAVENOUS | Status: AC
  Filled 2024-08-15: qty 100

## 2024-08-15 MED ORDER — PROPOFOL 10 MG/ML IV BOLUS
INTRAVENOUS | Status: AC
  Filled 2024-08-15: qty 20

## 2024-08-15 MED ORDER — PROPOFOL 10 MG/ML IV BOLUS
INTRAVENOUS | Status: DC | PRN
Start: 2024-08-15 — End: 2024-08-15
  Administered 2024-08-15: 150 mg via INTRAVENOUS

## 2024-08-15 MED ORDER — MIDAZOLAM HCL 2 MG/2ML IJ SOLN
INTRAMUSCULAR | Status: DC | PRN
  Administered 2024-08-15: 2 mg via INTRAVENOUS

## 2024-08-15 MED ORDER — ONDANSETRON HCL 4 MG/2ML IJ SOLN
INTRAMUSCULAR | Status: DC | PRN
  Administered 2024-08-15: 4 mg via INTRAVENOUS

## 2024-08-15 MED ORDER — LIDOCAINE HCL URETHRAL/MUCOSAL 2 % EX GEL
CUTANEOUS | Status: AC
  Filled 2024-08-15: qty 11

## 2024-08-15 MED ORDER — MIDAZOLAM HCL 2 MG/2ML IJ SOLN
INTRAMUSCULAR | Status: AC
  Filled 2024-08-15: qty 2

## 2024-08-15 MED ORDER — IOHEXOL 300 MG/ML  SOLN
INTRAMUSCULAR | Status: DC | PRN
  Administered 2024-08-15: 6 mL

## 2024-08-15 MED ORDER — LIDOCAINE 2% (20 MG/ML) 5 ML SYRINGE
INTRAMUSCULAR | Status: AC
  Filled 2024-08-15: qty 5

## 2024-08-15 MED ORDER — CEFAZOLIN SODIUM-DEXTROSE 2-4 GM/100ML-% IV SOLN
2.0000 g | INTRAVENOUS | Status: AC
Start: 2024-08-15 — End: 2024-08-15
  Administered 2024-08-15: 2 g via INTRAVENOUS

## 2024-08-15 MED ORDER — CHLORHEXIDINE GLUCONATE 0.12 % MT SOLN
15.0000 mL | Freq: Once | OROMUCOSAL | Status: AC
  Administered 2024-08-15: 15 mL via OROMUCOSAL

## 2024-08-15 MED ORDER — INSULIN ASPART 100 UNIT/ML IJ SOLN
0.0000 [IU] | INTRAMUSCULAR | Status: DC | PRN
  Administered 2024-08-15: 10 [IU] via SUBCUTANEOUS

## 2024-08-15 MED ORDER — FENTANYL CITRATE (PF) 250 MCG/5ML IJ SOLN
INTRAMUSCULAR | Status: AC
  Filled 2024-08-15: qty 5

## 2024-08-15 MED ORDER — CHLORHEXIDINE GLUCONATE 0.12 % MT SOLN
OROMUCOSAL | Status: AC
  Filled 2024-08-15: qty 15

## 2024-08-15 MED ORDER — ORAL CARE MOUTH RINSE: 15.0000 mL | Freq: Once | OROMUCOSAL | Status: AC

## 2024-08-15 MED ORDER — OXYCODONE-ACETAMINOPHEN 5-325 MG PO TABS: 1.0000 | ORAL_TABLET | Freq: Three times a day (TID) | ORAL | 0 refills | Status: AC | PRN

## 2024-08-15 MED ORDER — SODIUM CHLORIDE 0.9 % IR SOLN
Status: DC | PRN
  Administered 2024-08-15: 3000 mL via INTRAVESICAL

## 2024-08-15 MED ORDER — LIDOCAINE 2% (20 MG/ML) 5 ML SYRINGE
INTRAMUSCULAR | Status: DC | PRN
  Administered 2024-08-15: 60 mg via INTRAVENOUS

## 2024-08-15 MED ORDER — ONDANSETRON HCL 4 MG/2ML IJ SOLN
INTRAMUSCULAR | Status: AC
  Filled 2024-08-15: qty 2

## 2024-08-15 MED ORDER — FENTANYL CITRATE (PF) 250 MCG/5ML IJ SOLN
INTRAMUSCULAR | Status: DC | PRN
  Administered 2024-08-15: 50 ug via INTRAVENOUS

## 2024-08-15 MED ORDER — LACTATED RINGERS IV SOLN: INTRAVENOUS | Status: DC

## 2024-08-15 SURGICAL SUPPLY — 16 items
BAG DRAIN URO-CYSTO SKYTR STRL (DRAIN) ×1 IMPLANT
BASKET ZERO TIP NITINOL 2.4FR (BASKET) IMPLANT
CATH URETL OPEN END 6FR 70 (CATHETERS) IMPLANT
FIBER LASER MOSES 200 DFL (Laser) IMPLANT
GLOVE BIO SURGEON STRL SZ7.5 (GLOVE) ×1 IMPLANT
GOWN STRL REUS W/ TWL XL LVL3 (GOWN DISPOSABLE) ×1 IMPLANT
GUIDEWIRE STR DUAL SENSOR (WIRE) ×1 IMPLANT
KIT TURNOVER KIT B (KITS) ×1 IMPLANT
LASER FIB FLEXIVA PULSE ID 365 (Laser) IMPLANT
MANIFOLD NEPTUNE II (INSTRUMENTS) ×1 IMPLANT
PACK CYSTO (CUSTOM PROCEDURE TRAY) ×1 IMPLANT
SLEEVE SCD COMPRESS KNEE MED (STOCKING) ×1 IMPLANT
SOLN 0.9% NACL 3000 ML (IV SOLUTION) IMPLANT
STENT URET 6FRX24 CONTOUR (STENTS) IMPLANT
TUBE CONNECTING 12X1/4 (SUCTIONS) IMPLANT
TUBING UROLOGY SET (TUBING) ×1 IMPLANT

## 2024-08-15 NOTE — Discharge Instructions (Addendum)
 [facility] [phone] Post Ureteroscopy With or Without Stent Instructions  Definitions:  Ureter: The duct that transports urine from the kidney to the bladder. Stent:   A plastic hollow tube that is placed into the ureter, from the kidney to the                 bladder to prevent the ureter from swelling shut.  GENERAL INSTRUCTIONS:  Despite the fact that no skin incisions were used, the area around the ureter and bladder is raw and irritated. The stent is a foreign body which will further irritate the bladder wall. This irritation is manifested by increased frequency of urination, both day and night, and by an increase in the urge to urinate. In some, the urge to urinate is present almost always. Sometimes the urge is strong enough that you may not be able to stop yourself from urinating. The only real cure is to remove the stent and then give time for the bladder wall to heal which can't be done until the danger of the ureter swelling shut has passed, which varies.  You may see some blood in your urine while the stent is in place and a few days afterwards. Do not be alarmed, even if the urine was clear for a while. Get off your feet and drink lots of fluids until clearing occurs. If you start to pass clots or don't improve, call us.  DIET: You may return to your normal diet immediately. Because of the raw surface of your bladder, alcohol, spicy foods, acid type foods and drinks with caffeine may cause irritation or frequency and should be used in moderation. To keep your urine flowing freely and to avoid constipation, drink plenty of fluids during the day ( 8-10 glasses ). Tip: Avoid cranberry juice because it is very acidic.  ACTIVITY: Your physical activity doesn't need to be restricted. However, if you are very active, you may see some blood in your urine. We suggest that you reduce your activity under these circumstances until the bleeding has stopped.  BOWELS: It is  important to keep your bowels regular during the postoperative period. Straining with bowel movements can cause bleeding. A bowel movement every other day is reasonable. Use a mild laxative if needed, such as Milk of Magnesia 2-3 tablespoons, or 2 Dulcolax tablets. Call if you continue to have problems. If you have been taking narcotics for pain, before, during or after your surgery, you may be constipated. Take a laxative if necessary.   MEDICATION: You should resume your pre-surgery medications unless told not to. You may take oxybutynin or flomax if prescribed for bladder spasms or discomfort from the stent Take pain medication as directed for pain refractory to conservative management  PROBLEMS YOU SHOULD REPORT TO [place]: Fevers over 100.5 Fahrenheit. Heavy bleeding, or clots ( See above notes about blood in urine ). Inability to urinate. Drug reactions ( hives, rash, nausea, vomiting, diarrhea ). Severe burning or pain with urination that is not improving.   Post Anesthesia Home Care Instructions  Activity: Get plenty of rest for the remainder of the day. A responsible individual must stay with you for 24 hours following the procedure.  For the next 24 hours, DO NOT: -Drive a car -Advertising copywriter -Drink alcoholic beverages -Take any medication unless instructed by your physician -Make any legal decisions or sign important papers.  Meals: Start with liquid foods such as gelatin or soup. Progress to regular foods as tolerated. Avoid greasy, spicy,  heavy foods. If nausea and/or vomiting occur, drink only clear liquids until the nausea and/or vomiting subsides. Call your physician if vomiting continues.  Special Instructions/Symptoms: Your throat may feel dry or sore from the anesthesia or the breathing tube placed in your throat during surgery. If this causes discomfort, gargle with warm salt water. The discomfort should disappear within 24 hours.

## 2024-08-15 NOTE — Anesthesia Procedure Notes (Signed)
 Procedure Name: LMA Insertion Date/Time: 08/15/2024 2:42 PM  Performed by: Marva Lonni PARAS, CRNAPre-anesthesia Checklist: Patient identified, Emergency Drugs available, Suction available and Patient being monitored Patient Re-evaluated:Patient Re-evaluated prior to induction Oxygen Delivery Method: Circle System Utilized Preoxygenation: Pre-oxygenation with 100% oxygen Induction Type: IV induction Ventilation: Mask ventilation without difficulty LMA: LMA inserted LMA Size: 4.0 Number of attempts: 1 Airway Equipment and Method: Bite block Placement Confirmation: positive ETCO2 Tube secured with: Tape Dental Injury: Teeth and Oropharynx as per pre-operative assessment

## 2024-08-15 NOTE — Op Note (Signed)
 Operative Note  Preoperative diagnosis:  1.  Left ureteral calculus  Postoperative diagnosis: 1.  Left ureteral calculus  Procedure(s): 1.  Cystoscopy with left retrograde pyelogram, left ureteroscopy with laser lithotripsy and stone basketing, left ureteral stent placement  Surgeon: Sherwood Edison, MD  Assistants: None  Anesthesia: General  Complications: None immediate  EBL: Minimal  Specimens: 1.  Ureteral calculus  Drains/Catheters: 1.  6 x 24 double-J ureteral stent  Intraoperative findings: 1.  Normal urethra bladder 2.  Left ureteroscopy confirmed an 8 mm proximal ureteral calculus that was dusted to smaller fragments with larger fragments basket extracted.  Entire stone treated. 3.  Left retrograde pyelogram after treatment of the stone showed minimal hydronephrosis.  No filling defect in the kidney.  Indication: 53 year old female with a left ureteral calculus presents for the previously mentioned operation.  Description of procedure:  The patient was identified and consent was obtained.  The patient was taken to the operating room and placed in the supine position.  The patient was placed under general anesthesia.  Perioperative antibiotics were administered.  The patient was placed in dorsal lithotomy.  Patient was prepped and draped in a standard sterile fashion and a timeout was performed.  A 21 French rigid cystoscope was advanced into the urethra and into the bladder.  Complete cystoscopy was performed with the findings noted above.  Left ureter was cannulated with a sensor wire which was advanced up to the kidney under fluoroscopic guidance.  Semirigid ureteroscopy was performed alongside the wire up to the stone which was laser fragmented on dust settings to smaller fragments.  Larger fragments were basket extracted.  Entire stone was treated.  I advanced the scope up to the renal pelvis and no other calculi were seen.  I shot a retrograde pyelogram through the scope  with findings noted above.  I withdrew the scope visualizing the ureter upon removal.  There were no clinically significant stone fragments remaining.  There was some ureteral edema at the level of stone impaction but no ureteral injury was identified.  I backloaded the wire onto rigid cystoscope and advanced that into the bladder followed by routine placement of a 6 x 24 double-J ureteral stent.  Fluoroscopy confirmed proximal placement and direct visualization confirmed a good coil within the bladder.  I drained the bladder and withdrew the scope.  Patient tolerated the procedure well and was stable postoperatively.  Plan: Follow-up in 1 week for stent removal

## 2024-08-15 NOTE — Interval H&P Note (Signed)
 History and Physical Interval Note:  08/15/2024 1:03 PM  Julia [name]  has presented today for surgery, with the diagnosis of LEFT URETERAL STONE.  The various methods of treatment have been discussed with the patient and family. After consideration of risks, benefits and other options for treatment, the patient has consented to  Procedure(s): CYSTOSCOPY/URETEROSCOPY/HOLMIUM LASER/STENT PLACEMENT (Left) as a surgical intervention.  The patient's history has been reviewed, patient examined, no change in status, stable for surgery.  I have reviewed the patient's chart and labs.  Questions were answered to the patient's satisfaction.     Sherwood JONETTA Edison, III

## 2024-08-15 NOTE — H&P (Signed)
 CC/HPI: 02/10/2021: Trimethoprim  continued at time of last office visit. Topical estrogen replacement also prescribed. She was encouraged to continue with pelvic floor physical therapy to assist with maintain appropriate bladder emptying as well. Her trimethoprim  prescription lapsed/ran out approximately 2 weeks ago. Beginning 2 days ago the patient again feeling poorly with some generalized body aches, low-grade fever of only 99.4, nausea/vomiting, pain and discomfort bilaterally in the lower back over the CVA area. She endorses some mild burning at the terminal end of voiding but otherwise frequency/urgency remain stable. No changes in force of stream or increased sensation of incomplete bladder emptying. She took 2 nitrofurantoin 100 mg capsules last night which eventually help alleviate some of her systemic symptoms which has resulted in a grossly normal appearing urinalysis today. She is in a walking boot today due to recent diagnosis of a Achilles tendon rupture thought to be from previous use of Cipro .   03/15/2021  Patient was seen on the 8th. At that time, she was noted to have some microscopic hematuria. This has her extremely worried because she has a friend that had severe bladder cancer.   05/19/2021: 53 year old female who presents today for follow-up. She thought she had a UTI earlier this weekend called to leave a specimen however, she was unable to get here in instead went to urgent care. She was told that her urine was negative for infection. She reports that her symptoms have mostly resolved. She does complain of occasional suprapubic burning and pressure. She would like to resume pelvic floor PFPT.   08/08/2024  This a patient have seen in the past for recurrent UTIs. She has been taking trimethoprim  nightly and has not had an issue with UTI. No longer taking vaginal estrogen. I recommended that she restart that. She recently went to the emergency department with severe left-sided  abdominal pain. CT scan was performed that showed a 10 x 5 mm left proximal ureteral calculus with moderate left hydronephrosis. KUB today shows delayed nephrogram down to the level of the calculus. Stone likely not visible on KUB and is not very dense with low Hounsfield units.     ALLERGIES: Ciprofloxacin  - Tendon Flurooquinolones SULFA     MEDICATIONS: metFORMIN  HCl 1000 MG Tablet  Metoprolol  Succinate ER 50 MG Tablet Extended Release 24 Hour  Basaglar  KwikPen  Furosemide  20 MG Tablet  Klor-Con   Levothyroxine  Sodium 75 MCG Capsule  Mounjaro  Multivitamin  Trimethoprim      GU PSH: Complex cystometrogram, w/ void pressure and urethral pressure profile studies, any technique - 2021 Complex Uroflow - 2021 Cystoscopy - 2022 Emg surf Electrd - 2021 Inject For cystogram - 2021 Intrabd voidng Press - 2021     NON-GU PSH: Appendectomy (laparoscopic) Cesarean Delivery         GU PMH: Chronic cystitis (w/o hematuria) - 2022, - 2022, - 2022, - 2022, - 2021, - 2021 Detrusor overactivity - 2022, - 2021 Microscopic hematuria - 2022 Incomplete bladder emptying - 2022, - 2022, - 2022, - 2021, - 2021, - 2021 Microscopic hematuria - 2022 Mixed incontinence - 2022, - 2022 Stress Incontinence - 2021 Urge incontinence - 2021, - 2021, - 2021 Urinary Urgency - 2021    NON-GU PMH: Muscle weakness (generalized) - 2022, - 2022, - 2021, - 2021 Other muscle spasm - 2022, - 2022, - 2021 Other specified disorders of muscle - 2022, - 2022 Arrhythmia Asthma Diabetes Type 2 GERD Hypercholesterolemia Hypothyroidism    FAMILY HISTORY: 1 son - Other Cancer - Mother Cirrhosis -  Father Diabetes - Runs in Family heart failure - Mother Myocardial Infarction - Runs in Family   SOCIAL HISTORY: Marital Status: Single Preferred Language: English; Race: White Current Smoking Status: Patient does not smoke anymore. Has not smoked since 05/04/2006.  <DIV'  Tobacco Use Assessment Completed:  Used  Tobacco in last 30 days? Drinks 1 caffeinated drink per day. </DIV'   REVIEW OF SYSTEMS:     GU Review Female:  Patient reports hard to postpone urination. Patient denies frequent urination, burning /pain with urination, get up at night to urinate, leakage of urine, stream starts and stops, trouble starting your stream, have to strain to urinate, and being pregnant.    Gastrointestinal (Upper):  Patient reports nausea, vomiting, and indigestion/ heartburn.     Gastrointestinal (Lower):  Patient reports constipation. Patient denies diarrhea.    Constitutional:  Patient denies fever, night sweats, weight loss, and fatigue.    Skin:  Patient denies skin rash/ lesion and itching.    Eyes:  Patient denies blurred vision and double vision.    Ears/ Nose/ Throat:  Patient denies sore throat and sinus problems.    Hematologic/Lymphatic:  Patient denies swollen glands and easy bruising.    Cardiovascular:  Patient denies leg swelling and chest pains.    Respiratory:  Patient denies cough and shortness of breath.    Endocrine:  Patient denies excessive thirst.    Musculoskeletal:  Patient denies back pain and joint pain.    Neurological:  Patient denies headaches and dizziness.    Psychologic:  Patient denies depression and anxiety.    Notes: weak stream      VITAL SIGNS:        08/08/2024 01:49 PM      Weight 173 lb / 78.47 kg      Height 64 in / 162.56 cm      BP 116/79 mmHg      Heart Rate 84 /min      Temperature 98.1 F / 36.7 C      BMI 29.7 kg/m      MULTI-SYSTEM PHYSICAL EXAMINATION:       Constitutional: Well-nourished. No physical deformities. Normally developed. Good grooming.      Neck: Neck symmetrical, not swollen. Normal tracheal position.      Respiratory: No labored breathing, no use of accessory muscles.       Cardiovascular: Normal temperature, normal extremity pulses, no swelling, no varicosities.      Lymphatic: No enlargement of neck, axillae, groin.      Skin: No  paleness, no jaundice, no cyanosis. No lesion, no ulcer, no rash.      Neurologic / Psychiatric: Oriented to time, oriented to place, oriented to person. No depression, no anxiety, no agitation.      Gastrointestinal: No mass, no tenderness, no rigidity, non obese abdomen.      Eyes: Normal conjunctivae. Normal eyelids.      Ears, Nose, Mouth, and Throat: Left ear no scars, no lesions, no masses. Right ear no scars, no lesions, no masses. Nose no scars, no lesions, no masses. Normal hearing. Normal lips.      Musculoskeletal: Normal gait and station of head and neck.              Complexity of Data:   Source Of History:  Patient  Records Review:  Previous Doctor Records, Previous Patient Records  Urine Test Review:  Urinalysis  X-Ray Review: KUB: Reviewed Films. Discussed With Patient.  C.T. Abdomen/Pelvis: Reviewed Films.  Reviewed Report. Discussed With Patient.     PROCEDURES:    KUB - [id number]  A single view of the abdomen is obtained.  Retained contrast in the left collecting system consistent with delayed nephrogram and obstruction down to the level of a mid ureteral calculus. She has moderate hydronephrosis.      Patient confirmed No Neulasta OnPro Device.      Visit Complexity - G2211      Urinalysis w/Scope  Dipstick Dipstick Cont'd Micro  Color: Yellow Bilirubin: Neg mg/dL WBC/hpf: 6 - 89/yeq  Appearance: Clear Ketones: Neg mg/dL RBC/hpf: 10 - 79/yeq  Specific Gravity: 1.025 Blood: 2+ ery/uL Bacteria: Mod (26-50/hpf)  pH: 5.5 Protein: 1+ mg/dL Cystals: NS (Not Seen)  Glucose: 2+ mg/dL Urobilinogen: 0.2 mg/dL Casts: NS (Not Seen)   Nitrites: Neg Trichomonas: Not Present   Leukocyte Esterase: Trace leu/uL Mucous: Not Present    Epithelial Cells: 0 - 5/hpf    Yeast: NS (Not Seen)    Sperm: Not Present    ASSESSMENT:     ICD-10 Details  1 GU:  Ureteral calculus - N20.1 Undiagnosed New Problem  2  Chronic cystitis (w/o hematuria) - N30.20 Chronic, Stable  3   Ureteral obstruction secondary to calculous - N13.2    PLAN:   Medications  New Meds: Estradiol 0.1 MG/GM Cream apply 1/2 inch nightly for 2 weeks followed by twice a week thereafter #1 3 Refill(s)     Pharmacy Name:  [id number]    Address:  [address] [place], [place] [address]    Phone:  [phone]    Fax:  [phone]   Orders  Labs Urine Culture  X-Rays: KUB  Schedule    Document  Letter(s):  Created for Patient: Clinical Summary   Notes:  We discussed the management of urinary stones. These options include observation, ureteroscopy, and shockwave lithotripsy. We discussed which options are relevant to these particular stones. We discussed the natural history of stones as well as the complications of untreated stones and the impact on quality of life without treatment as well as with each of the above listed treatments. We also discussed the efficacy of each treatment in its ability to clear the stone burden. With any of these management options I discussed the signs and symptoms of infection and the need for emergent treatment should these be experienced. For each option we discussed the ability of each procedure to clear the patient of their stone burden.   For observation I described the risks which include but are not limited to silent renal damage, life-threatening infection, need for emergent surgery, failure to pass stone, and pain.   For ureteroscopy I described the risks which include heart attack, stroke, pulmonary embolus, death, bleeding, infection, damage to contiguous structures, positioning injury, ureteral stricture, ureteral avulsion, ureteral injury, need for ureteral stent, inability to perform ureteroscopy, need for an interval procedure, inability to clear stone burden, stent discomfort and pain.   For shockwave lithotripsy I described the risks which include arrhythmia, kidney contusion, kidney hemorrhage, need for transfusion, pain, inability  to break up stone, inability to pass stone fragments, Steinstrasse, infection associated with obstructing stones, need for different surgical procedure, need for repeat shockwave lithotripsy.   Plan proceed with left ureteroscopy   She will restart the vaginal estrogen. She will continue the trimethoprim  for UTI prophylaxis for now   CC: Josette Aho   Signed by Sherwood Edison, III, M.D. on 08/08/24 at 2:12  PM (EDT

## 2024-08-15 NOTE — Anesthesia Preprocedure Evaluation (Addendum)
 Anesthesia Evaluation  Patient identified by MRN, date of birth, ID band Patient awake    Reviewed: Allergy  & Precautions, NPO status , Patient's Chart, lab work & pertinent test results, reviewed documented beta blocker date and time   Airway Mallampati: II  TM Distance: >3 FB Neck ROM: Full    Dental no notable dental hx. (+) Teeth Intact, Dental Advisory Given   Pulmonary former smoker   Pulmonary exam normal breath sounds clear to auscultation       Cardiovascular hypertension, Pt. on home beta blockers and Pt. on medications Normal cardiovascular exam+ dysrhythmias Supra Ventricular Tachycardia  Rhythm:Regular Rate:Normal  TTE 2018 - Left ventricle: The cavity size was normal. Systolic function was    normal. Wall motion was normal; there were no regional wall    motion abnormalities. Left ventricular diastolic function    parameters were normal.  - Aortic valve: Trileaflet; normal thickness leaflets. There was no    regurgitation.  - Aortic root: The aortic root was normal in size.  - Mitral valve: Structurally normal valve. There was no    regurgitation.  - Left atrium: The atrium was normal in size.  - Right ventricle: Systolic function was normal.  - Right atrium: The atrium was normal in size.  - Tricuspid valve: There was trivial regurgitation.  - Pulmonary arteries: Systolic pressure was within the normal    range.  - Inferior vena cava: The vessel was normal in size.  - Pericardium, extracardiac: There was no pericardial effusion.     Neuro/Psych negative neurological ROS  negative psych ROS   GI/Hepatic Neg liver ROS,GERD  ,,  Endo/Other  diabetes, Type 2, Insulin  Dependent, Oral Hypoglycemic AgentsHypothyroidism    Renal/GU Renal disease  negative genitourinary   Musculoskeletal negative musculoskeletal ROS (+)    Abdominal   Peds  Hematology negative hematology ROS (+)   Anesthesia Other  Findings   Reproductive/Obstetrics                              Anesthesia Physical Anesthesia Plan  ASA: 3  Anesthesia Plan: General   Post-op Pain Management: Tylenol  PO (pre-op)*   Induction: Intravenous  PONV Risk Score and Plan: 3 and Ondansetron , Dexamethasone  and Midazolam   Airway Management Planned: LMA  Additional Equipment:   Intra-op Plan:   Post-operative Plan: Extubation in OR  Informed Consent: I have reviewed the patients History and Physical, chart, labs and discussed the procedure including the risks, benefits and alternatives for the proposed anesthesia with the patient or authorized representative who has indicated his/her understanding and acceptance.     Dental advisory given  Plan Discussed with: CRNA  Anesthesia Plan Comments:          Anesthesia Quick Evaluation

## 2024-08-15 NOTE — Anesthesia Postprocedure Evaluation (Signed)
 Anesthesia Post Note  Patient: Julia [name]  Procedure(s) Performed: CYSTOSCOPY/URETEROSCOPY/HOLMIUM LASER/STENT PLACEMENT (Left)     Patient location during evaluation: PACU Anesthesia Type: General Level of consciousness: awake and alert Pain management: pain level controlled Vital Signs Assessment: post-procedure vital signs reviewed and stable Respiratory status: spontaneous breathing, nonlabored ventilation and respiratory function stable Cardiovascular status: blood pressure returned to baseline and stable Postop Assessment: no apparent nausea or vomiting Anesthetic complications: no   No notable events documented.  Last Vitals:  Vitals:   08/15/24 1530 08/15/24 1545  BP: 139/82 (!) 153/88  Pulse: 70 72  Resp: 13 14  Temp:    SpO2: 95% 94%    Last Pain:  Vitals:   08/15/24 1521  TempSrc:   PainSc: 0-No pain                 Dua Mehler,W. EDMOND

## 2024-08-15 NOTE — Transfer of Care (Signed)
 Immediate Anesthesia Transfer of Care Note  Patient: Julia [name]  Procedure(s) Performed: CYSTOSCOPY/URETEROSCOPY/HOLMIUM LASER/STENT PLACEMENT (Left)  Patient Location: PACU  Anesthesia Type:General  Level of Consciousness: awake, drowsy, and patient cooperative  Airway & Oxygen Therapy: Patient Spontanous Breathing  Post-op Assessment: Report given to RN and Post -op Vital signs reviewed and stable  Post vital signs: Reviewed and stable  Last Vitals:  Vitals Value Taken Time  BP 117/81 08/15/24 15:21  Temp    Pulse 69 08/15/24 15:23  Resp 15 08/15/24 15:23  SpO2 94 % 08/15/24 15:23  Vitals shown include unfiled device data.  Last Pain:  Vitals:   08/15/24 1213  TempSrc: Oral  PainSc: 3       Patients Stated Pain Goal: 3 (08/15/24 1213)  Complications: No notable events documented.

## 2024-08-18 ENCOUNTER — Encounter (HOSPITAL_COMMUNITY): Payer: Self-pay | Admitting: Urology
